# Patient Record
Sex: Male | Born: 1965 | Race: White | Hispanic: No | Marital: Married | Smoking: Former smoker
Health system: Southern US, Community
[De-identification: ages and names within clinical notes are randomized; demographics above are authoritative.]

## PROBLEM LIST (undated history)

## (undated) DIAGNOSIS — I10 Essential (primary) hypertension: Secondary | ICD-10-CM

## (undated) DIAGNOSIS — R600 Localized edema: Secondary | ICD-10-CM

## (undated) DIAGNOSIS — N19 Unspecified kidney failure: Secondary | ICD-10-CM

## (undated) DIAGNOSIS — Z87442 Personal history of urinary calculi: Secondary | ICD-10-CM

## (undated) HISTORY — PX: SHOULDER SURGERY: SHX246

## (undated) HISTORY — PX: FOOT SURGERY: SHX648

## (undated) HISTORY — PX: EYE SURGERY: SHX253

## (undated) HISTORY — PX: CATARACT EXTRACTION, BILATERAL: SHX1313

## (undated) HISTORY — DX: Unspecified kidney failure: N19

## (undated) HISTORY — PX: KIDNEY STONE SURGERY: SHX686

## (undated) HISTORY — DX: Essential (primary) hypertension: I10

## (undated) HISTORY — PX: CHOLECYSTECTOMY: SHX55

## (undated) HISTORY — PX: DIALYSIS/PERMA CATHETER INSERTION: CATH118288

## (undated) HISTORY — DX: Localized edema: R60.0

---

## 2005-05-06 HISTORY — PX: CYSTOSCOPY W/ URETERAL STENT PLACEMENT: SHX1429

## 2009-12-11 ENCOUNTER — Encounter: Admission: RE | Admit: 2009-12-11 | Discharge: 2009-12-11 | Payer: Self-pay | Admitting: Orthopaedic Surgery

## 2012-03-05 DIAGNOSIS — R600 Localized edema: Secondary | ICD-10-CM

## 2012-03-05 DIAGNOSIS — E785 Hyperlipidemia, unspecified: Secondary | ICD-10-CM | POA: Insufficient documentation

## 2012-03-05 DIAGNOSIS — G4730 Sleep apnea, unspecified: Secondary | ICD-10-CM

## 2012-03-05 DIAGNOSIS — I509 Heart failure, unspecified: Secondary | ICD-10-CM

## 2012-03-05 DIAGNOSIS — N200 Calculus of kidney: Secondary | ICD-10-CM

## 2012-03-05 HISTORY — DX: Sleep apnea, unspecified: G47.30

## 2012-03-05 HISTORY — DX: Morbid (severe) obesity due to excess calories: E66.01

## 2012-03-05 HISTORY — DX: Calculus of kidney: N20.0

## 2012-03-05 HISTORY — DX: Localized edema: R60.0

## 2012-03-05 HISTORY — DX: Heart failure, unspecified: I50.9

## 2012-03-05 HISTORY — DX: Hyperlipidemia, unspecified: E78.5

## 2015-07-26 DIAGNOSIS — Z89421 Acquired absence of other right toe(s): Secondary | ICD-10-CM | POA: Insufficient documentation

## 2015-07-26 HISTORY — DX: Acquired absence of other right toe(s): Z89.421

## 2016-03-13 DIAGNOSIS — M25871 Other specified joint disorders, right ankle and foot: Secondary | ICD-10-CM | POA: Insufficient documentation

## 2016-03-13 HISTORY — DX: Other specified joint disorders, right ankle and foot: M25.871

## 2016-05-08 DIAGNOSIS — M19071 Primary osteoarthritis, right ankle and foot: Secondary | ICD-10-CM

## 2016-05-08 HISTORY — DX: Primary osteoarthritis, right ankle and foot: M19.071

## 2016-08-01 DIAGNOSIS — I82409 Acute embolism and thrombosis of unspecified deep veins of unspecified lower extremity: Secondary | ICD-10-CM

## 2016-08-01 HISTORY — PX: IVC FILTER INSERTION: CATH118245

## 2016-08-01 HISTORY — DX: Acute embolism and thrombosis of unspecified deep veins of unspecified lower extremity: I82.409

## 2016-08-17 DIAGNOSIS — E1151 Type 2 diabetes mellitus with diabetic peripheral angiopathy without gangrene: Secondary | ICD-10-CM

## 2016-08-17 DIAGNOSIS — R600 Localized edema: Secondary | ICD-10-CM | POA: Diagnosis not present

## 2016-08-17 DIAGNOSIS — R0902 Hypoxemia: Secondary | ICD-10-CM

## 2016-08-17 DIAGNOSIS — I2699 Other pulmonary embolism without acute cor pulmonale: Secondary | ICD-10-CM

## 2016-08-18 DIAGNOSIS — R0902 Hypoxemia: Secondary | ICD-10-CM | POA: Diagnosis not present

## 2016-08-18 DIAGNOSIS — I2699 Other pulmonary embolism without acute cor pulmonale: Secondary | ICD-10-CM | POA: Diagnosis not present

## 2016-08-18 DIAGNOSIS — E1151 Type 2 diabetes mellitus with diabetic peripheral angiopathy without gangrene: Secondary | ICD-10-CM | POA: Diagnosis not present

## 2016-08-19 DIAGNOSIS — I2699 Other pulmonary embolism without acute cor pulmonale: Secondary | ICD-10-CM | POA: Diagnosis not present

## 2016-08-19 DIAGNOSIS — R600 Localized edema: Secondary | ICD-10-CM

## 2016-08-19 DIAGNOSIS — R0902 Hypoxemia: Secondary | ICD-10-CM | POA: Diagnosis not present

## 2016-08-19 DIAGNOSIS — E1151 Type 2 diabetes mellitus with diabetic peripheral angiopathy without gangrene: Secondary | ICD-10-CM | POA: Diagnosis not present

## 2016-08-20 DIAGNOSIS — E1151 Type 2 diabetes mellitus with diabetic peripheral angiopathy without gangrene: Secondary | ICD-10-CM | POA: Diagnosis not present

## 2016-08-20 DIAGNOSIS — R600 Localized edema: Secondary | ICD-10-CM | POA: Diagnosis not present

## 2016-08-20 DIAGNOSIS — R0902 Hypoxemia: Secondary | ICD-10-CM | POA: Diagnosis not present

## 2016-08-20 DIAGNOSIS — I2699 Other pulmonary embolism without acute cor pulmonale: Secondary | ICD-10-CM | POA: Diagnosis not present

## 2016-11-26 DIAGNOSIS — J4530 Mild persistent asthma, uncomplicated: Secondary | ICD-10-CM

## 2016-11-26 DIAGNOSIS — Z7901 Long term (current) use of anticoagulants: Secondary | ICD-10-CM | POA: Insufficient documentation

## 2016-11-26 DIAGNOSIS — Z79899 Other long term (current) drug therapy: Secondary | ICD-10-CM

## 2016-11-26 DIAGNOSIS — Z86711 Personal history of pulmonary embolism: Secondary | ICD-10-CM

## 2016-11-26 DIAGNOSIS — R5383 Other fatigue: Secondary | ICD-10-CM

## 2016-11-26 DIAGNOSIS — E782 Mixed hyperlipidemia: Secondary | ICD-10-CM

## 2016-11-26 DIAGNOSIS — R5381 Other malaise: Secondary | ICD-10-CM

## 2016-11-26 DIAGNOSIS — I10 Essential (primary) hypertension: Secondary | ICD-10-CM

## 2016-11-26 DIAGNOSIS — E66813 Obesity, class 3: Secondary | ICD-10-CM | POA: Insufficient documentation

## 2016-11-26 DIAGNOSIS — G4731 Primary central sleep apnea: Secondary | ICD-10-CM | POA: Insufficient documentation

## 2016-11-26 HISTORY — DX: Personal history of pulmonary embolism: Z86.711

## 2016-11-26 HISTORY — DX: Mild persistent asthma, uncomplicated: J45.30

## 2016-11-26 HISTORY — DX: Morbid (severe) obesity due to excess calories: E66.01

## 2016-11-26 HISTORY — DX: Long term (current) use of anticoagulants: Z79.01

## 2016-11-26 HISTORY — DX: Other long term (current) drug therapy: Z79.899

## 2016-11-26 HISTORY — DX: Essential (primary) hypertension: I10

## 2016-11-26 HISTORY — DX: Primary central sleep apnea: G47.31

## 2016-11-26 HISTORY — DX: Other fatigue: R53.83

## 2016-11-26 HISTORY — DX: Other malaise: R53.81

## 2016-11-26 HISTORY — DX: Obesity, class 3: E66.813

## 2016-11-26 HISTORY — DX: Mixed hyperlipidemia: E78.2

## 2017-01-15 DIAGNOSIS — M109 Gout, unspecified: Secondary | ICD-10-CM | POA: Insufficient documentation

## 2017-01-15 HISTORY — DX: Gout, unspecified: M10.9

## 2017-10-18 DIAGNOSIS — E1151 Type 2 diabetes mellitus with diabetic peripheral angiopathy without gangrene: Secondary | ICD-10-CM

## 2017-10-18 DIAGNOSIS — L0231 Cutaneous abscess of buttock: Secondary | ICD-10-CM

## 2017-10-18 DIAGNOSIS — E119 Type 2 diabetes mellitus without complications: Secondary | ICD-10-CM

## 2017-10-19 DIAGNOSIS — L0231 Cutaneous abscess of buttock: Secondary | ICD-10-CM | POA: Diagnosis not present

## 2017-10-19 DIAGNOSIS — E1151 Type 2 diabetes mellitus with diabetic peripheral angiopathy without gangrene: Secondary | ICD-10-CM | POA: Diagnosis not present

## 2017-10-19 DIAGNOSIS — E119 Type 2 diabetes mellitus without complications: Secondary | ICD-10-CM | POA: Diagnosis not present

## 2017-10-20 DIAGNOSIS — E1151 Type 2 diabetes mellitus with diabetic peripheral angiopathy without gangrene: Secondary | ICD-10-CM | POA: Diagnosis not present

## 2017-10-20 DIAGNOSIS — L0231 Cutaneous abscess of buttock: Secondary | ICD-10-CM | POA: Diagnosis not present

## 2017-10-20 DIAGNOSIS — E119 Type 2 diabetes mellitus without complications: Secondary | ICD-10-CM | POA: Diagnosis not present

## 2017-10-21 DIAGNOSIS — L0231 Cutaneous abscess of buttock: Secondary | ICD-10-CM | POA: Diagnosis not present

## 2017-10-21 DIAGNOSIS — E1151 Type 2 diabetes mellitus with diabetic peripheral angiopathy without gangrene: Secondary | ICD-10-CM | POA: Diagnosis not present

## 2017-10-21 DIAGNOSIS — E119 Type 2 diabetes mellitus without complications: Secondary | ICD-10-CM | POA: Diagnosis not present

## 2017-10-22 DIAGNOSIS — E119 Type 2 diabetes mellitus without complications: Secondary | ICD-10-CM | POA: Diagnosis not present

## 2017-10-22 DIAGNOSIS — L0231 Cutaneous abscess of buttock: Secondary | ICD-10-CM | POA: Diagnosis not present

## 2017-10-22 DIAGNOSIS — E1151 Type 2 diabetes mellitus with diabetic peripheral angiopathy without gangrene: Secondary | ICD-10-CM | POA: Diagnosis not present

## 2017-10-23 DIAGNOSIS — E1151 Type 2 diabetes mellitus with diabetic peripheral angiopathy without gangrene: Secondary | ICD-10-CM | POA: Diagnosis not present

## 2017-10-23 DIAGNOSIS — E119 Type 2 diabetes mellitus without complications: Secondary | ICD-10-CM | POA: Diagnosis not present

## 2017-10-23 DIAGNOSIS — L0231 Cutaneous abscess of buttock: Secondary | ICD-10-CM | POA: Diagnosis not present

## 2017-10-24 DIAGNOSIS — L0231 Cutaneous abscess of buttock: Secondary | ICD-10-CM | POA: Diagnosis not present

## 2017-10-24 DIAGNOSIS — E119 Type 2 diabetes mellitus without complications: Secondary | ICD-10-CM | POA: Diagnosis not present

## 2017-10-24 DIAGNOSIS — E1151 Type 2 diabetes mellitus with diabetic peripheral angiopathy without gangrene: Secondary | ICD-10-CM | POA: Diagnosis not present

## 2018-04-27 DIAGNOSIS — E119 Type 2 diabetes mellitus without complications: Secondary | ICD-10-CM

## 2018-04-27 DIAGNOSIS — I2699 Other pulmonary embolism without acute cor pulmonale: Secondary | ICD-10-CM

## 2018-04-27 DIAGNOSIS — N183 Chronic kidney disease, stage 3 (moderate): Secondary | ICD-10-CM

## 2018-04-27 DIAGNOSIS — G4730 Sleep apnea, unspecified: Secondary | ICD-10-CM

## 2018-04-27 DIAGNOSIS — A419 Sepsis, unspecified organism: Secondary | ICD-10-CM

## 2018-04-28 DIAGNOSIS — A419 Sepsis, unspecified organism: Secondary | ICD-10-CM | POA: Diagnosis not present

## 2018-04-28 DIAGNOSIS — N183 Chronic kidney disease, stage 3 (moderate): Secondary | ICD-10-CM | POA: Diagnosis not present

## 2018-04-28 DIAGNOSIS — G4730 Sleep apnea, unspecified: Secondary | ICD-10-CM | POA: Diagnosis not present

## 2018-04-28 DIAGNOSIS — Z0001 Encounter for general adult medical examination with abnormal findings: Secondary | ICD-10-CM

## 2018-04-29 DIAGNOSIS — A419 Sepsis, unspecified organism: Secondary | ICD-10-CM | POA: Diagnosis not present

## 2018-04-29 DIAGNOSIS — G4730 Sleep apnea, unspecified: Secondary | ICD-10-CM | POA: Diagnosis not present

## 2018-04-29 DIAGNOSIS — N183 Chronic kidney disease, stage 3 (moderate): Secondary | ICD-10-CM | POA: Diagnosis not present

## 2018-04-30 DIAGNOSIS — A419 Sepsis, unspecified organism: Secondary | ICD-10-CM | POA: Diagnosis not present

## 2018-04-30 DIAGNOSIS — N183 Chronic kidney disease, stage 3 (moderate): Secondary | ICD-10-CM | POA: Diagnosis not present

## 2018-04-30 DIAGNOSIS — G4730 Sleep apnea, unspecified: Secondary | ICD-10-CM | POA: Diagnosis not present

## 2018-05-01 DIAGNOSIS — N183 Chronic kidney disease, stage 3 (moderate): Secondary | ICD-10-CM | POA: Diagnosis not present

## 2018-05-01 DIAGNOSIS — G4730 Sleep apnea, unspecified: Secondary | ICD-10-CM | POA: Diagnosis not present

## 2018-05-01 DIAGNOSIS — A419 Sepsis, unspecified organism: Secondary | ICD-10-CM | POA: Diagnosis not present

## 2018-05-20 DIAGNOSIS — L603 Nail dystrophy: Secondary | ICD-10-CM | POA: Insufficient documentation

## 2018-05-20 HISTORY — DX: Nail dystrophy: L60.3

## 2018-05-26 DIAGNOSIS — M48061 Spinal stenosis, lumbar region without neurogenic claudication: Secondary | ICD-10-CM

## 2018-05-26 DIAGNOSIS — D638 Anemia in other chronic diseases classified elsewhere: Secondary | ICD-10-CM | POA: Insufficient documentation

## 2018-05-26 DIAGNOSIS — K7581 Nonalcoholic steatohepatitis (NASH): Secondary | ICD-10-CM

## 2018-05-26 DIAGNOSIS — I517 Cardiomegaly: Secondary | ICD-10-CM

## 2018-05-26 HISTORY — DX: Spinal stenosis, lumbar region without neurogenic claudication: M48.061

## 2018-05-26 HISTORY — DX: Cardiomegaly: I51.7

## 2018-05-26 HISTORY — DX: Anemia in other chronic diseases classified elsewhere: D63.8

## 2018-05-26 HISTORY — DX: Nonalcoholic steatohepatitis (NASH): K75.81

## 2018-06-03 DIAGNOSIS — D472 Monoclonal gammopathy: Secondary | ICD-10-CM

## 2018-06-03 HISTORY — DX: Monoclonal gammopathy: D47.2

## 2019-06-18 DIAGNOSIS — L84 Corns and callosities: Secondary | ICD-10-CM | POA: Insufficient documentation

## 2019-06-18 HISTORY — DX: Corns and callosities: L84

## 2019-07-12 DIAGNOSIS — E1122 Type 2 diabetes mellitus with diabetic chronic kidney disease: Secondary | ICD-10-CM

## 2019-07-12 DIAGNOSIS — N183 Chronic kidney disease, stage 3 unspecified: Secondary | ICD-10-CM | POA: Insufficient documentation

## 2019-07-12 DIAGNOSIS — Z794 Long term (current) use of insulin: Secondary | ICD-10-CM | POA: Insufficient documentation

## 2019-07-12 DIAGNOSIS — N1832 Chronic kidney disease, stage 3b: Secondary | ICD-10-CM

## 2019-07-12 DIAGNOSIS — I129 Hypertensive chronic kidney disease with stage 1 through stage 4 chronic kidney disease, or unspecified chronic kidney disease: Secondary | ICD-10-CM | POA: Insufficient documentation

## 2019-07-12 HISTORY — DX: Hypertensive chronic kidney disease with stage 1 through stage 4 chronic kidney disease, or unspecified chronic kidney disease: I12.9

## 2019-07-12 HISTORY — DX: Chronic kidney disease, stage 3b: N18.32

## 2019-07-12 HISTORY — DX: Chronic kidney disease, stage 3 unspecified: N18.30

## 2019-07-12 HISTORY — DX: Type 2 diabetes mellitus with diabetic chronic kidney disease: E11.22

## 2019-07-12 HISTORY — DX: Long term (current) use of insulin: Z79.4

## 2020-01-10 DIAGNOSIS — E11621 Type 2 diabetes mellitus with foot ulcer: Secondary | ICD-10-CM | POA: Insufficient documentation

## 2020-01-10 DIAGNOSIS — L97512 Non-pressure chronic ulcer of other part of right foot with fat layer exposed: Secondary | ICD-10-CM | POA: Insufficient documentation

## 2020-01-10 HISTORY — DX: Non-pressure chronic ulcer of other part of right foot with fat layer exposed: L97.512

## 2020-01-10 HISTORY — DX: Type 2 diabetes mellitus with foot ulcer: E11.621

## 2020-02-09 DIAGNOSIS — U071 COVID-19: Secondary | ICD-10-CM

## 2020-02-09 HISTORY — DX: COVID-19: U07.1

## 2020-03-17 LAB — HEPATIC FUNCTION PANEL
ALT: 20 (ref 10–40)
AST: 22 (ref 14–40)
Alkaline Phosphatase: 65 (ref 25–125)
Bilirubin, Total: 0.6

## 2020-03-17 LAB — IRON,TIBC AND FERRITIN PANEL
%SAT: 46
Ferritin: 285
Iron: 116
TIBC: 253

## 2020-03-17 LAB — CBC AND DIFFERENTIAL
HCT: 41 (ref 41–53)
Hemoglobin: 14.5 (ref 13.5–17.5)
Neutrophils Absolute: 5.46
Platelets: 244 (ref 150–399)
WBC: 8.4

## 2020-03-17 LAB — COMPREHENSIVE METABOLIC PANEL
Albumin: 3.8 (ref 3.5–5.0)
Calcium: 9.3 (ref 8.7–10.7)

## 2020-03-17 LAB — BASIC METABOLIC PANEL
BUN: 54 — AB (ref 4–21)
CO2: 28 — AB (ref 13–22)
Chloride: 100 (ref 99–108)
Creatinine: 1.8 — AB (ref 0.6–1.3)
Glucose: 302
Potassium: 4.1 (ref 3.4–5.3)
Sodium: 104 — AB (ref 137–147)

## 2020-03-17 LAB — CBC: RBC: 4.34 (ref 3.87–5.11)

## 2020-03-21 ENCOUNTER — Other Ambulatory Visit: Payer: Self-pay | Admitting: Oncology

## 2020-03-21 HISTORY — DX: Other hemochromatosis: E83.118

## 2020-03-23 ENCOUNTER — Other Ambulatory Visit: Payer: Self-pay | Admitting: Hematology and Oncology

## 2020-03-24 ENCOUNTER — Other Ambulatory Visit: Payer: Self-pay

## 2020-03-24 ENCOUNTER — Inpatient Hospital Stay: Payer: PRIVATE HEALTH INSURANCE | Attending: Oncology

## 2020-03-29 ENCOUNTER — Other Ambulatory Visit: Payer: Self-pay | Admitting: Oncology

## 2020-05-05 DIAGNOSIS — Z8616 Personal history of COVID-19: Secondary | ICD-10-CM

## 2020-05-05 HISTORY — DX: Personal history of COVID-19: Z86.16

## 2020-07-21 ENCOUNTER — Inpatient Hospital Stay: Payer: PRIVATE HEALTH INSURANCE | Attending: Oncology

## 2020-07-21 ENCOUNTER — Other Ambulatory Visit: Payer: Self-pay | Admitting: Hematology and Oncology

## 2020-07-23 ENCOUNTER — Other Ambulatory Visit: Payer: Self-pay | Admitting: Oncology

## 2020-07-23 NOTE — Progress Notes (Deleted)
[place]  [address] [place],  [place]  [address] [phone]  Clinic Day:  07/23/2020  Referring physician: Raina Mina., MD   HISTORY OF PRESENT ILLNESS:  The patient is a 55 y.o. 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  Due to his elevated iron parameters, the patient undergoes periodic phlebotomies to keep his ferritin less than 100.  He comes in today to reassess his iron parameters.  Since his last visit, the patient has been doing well.  He denies having any systemic symptoms or complications related to his underlying hemochromatosis.    PHYSICAL EXAM:  There were no vitals taken for this visit. Wt Readings from Last 3 Encounters:  No data found for Wt   There is no height or weight on file to calculate BMI. Performance status (ECOG): {CHL ONC [id number] Physical Exam Constitutional:      Appearance: Normal appearance. He is not ill-appearing.  HENT:     Mouth/Throat:     Mouth: Mucous membranes are moist.     Pharynx: Oropharynx is clear. No oropharyngeal exudate or posterior oropharyngeal erythema.  Cardiovascular:     Rate and Rhythm: Normal rate and regular rhythm.     Heart sounds: No murmur heard. No friction rub. No gallop.   Pulmonary:     Effort: Pulmonary effort is normal. No respiratory distress.     Breath sounds: Normal breath sounds. No wheezing, rhonchi or rales.  Chest:  Breasts:     Right: No axillary adenopathy or supraclavicular adenopathy.     Left: No axillary adenopathy or supraclavicular adenopathy.    Abdominal:     General: Bowel sounds are normal. There is no distension.     Palpations: Abdomen is soft. There is no mass.     Tenderness: There is no abdominal tenderness.  Musculoskeletal:        General: No swelling.     Right lower leg: No edema.     Left lower leg: No edema.  Lymphadenopathy:     Cervical: No cervical adenopathy.     Upper Body:     Right upper body: No  supraclavicular or axillary adenopathy.     Left upper body: No supraclavicular or axillary adenopathy.     Lower Body: No right inguinal adenopathy. No left inguinal adenopathy.  Skin:    General: Skin is warm.     Coloration: Skin is not jaundiced.     Findings: No lesion or rash.  Neurological:     General: No focal deficit present.     Mental Status: He is alert and oriented to person, place, and time. Mental status is at baseline.     Cranial Nerves: Cranial nerves are intact.  Psychiatric:        Mood and Affect: Mood normal.        Behavior: Behavior normal.        Thought Content: Thought content normal.     LABS:   CBC Latest Ref Rng & Units 03/17/2020  WBC - 8.4  Hemoglobin 13.5 - 17.5 14.5  Hematocrit 41 - 53 41  Platelets 150 - 399 244   CMP Latest Ref Rng & Units 03/17/2020  BUN 4 - 21 54(A)  Creatinine 0.6 - 1.3 1.8(A)  Sodium 137 - 147 104(A)  Potassium 3.4 - 5.3 4.1  Chloride 99 - 108 100  CO2 13 - 22 28(A)  Calcium 8.7 - 10.7 9.3  Alkaline Phos 25 - 125 65  AST 14 -  40 22  ALT 10 - 40 20     No results found for: CEA1 / No results found for: CEA1 No results found for: PSA1 No results found for: UUV253 No results found for: CAN125  No results found for: TOTALPROTELP, ALBUMINELP, A1GS, A2GS, BETS, BETA2SER, GAMS, MSPIKE, SPEI Lab Results  Component Value Date   TIBC 253 03/17/2020   FERRITIN 285 03/17/2020   IRONPCTSAT 46 03/17/2020   No results found for: LDH  No results found for: AFPTUMOR, TOTALPROTELP, ALBUMINELP, A1GS, A2GS, BETS, BETA2SER, GAMS, MSPIKE, SPEI, LDH, CEA1, PSA1, IGASERUM, IGGSERUM, IGMSERUM, THGAB, THYROGLB  Recent Review Flowsheet Data    Oncology Labs 03/17/2020   FERRITIN 285   IRONPCTSAT 46       STUDIES:  No results found.    ASSESSMENT & PLAN:   Assessment/Plan:  A 55 y.o. male with *** .The patient understands all the plans discussed today and is in agreement with them.      Dequincy Macarthur Critchley, MD

## 2020-07-24 ENCOUNTER — Inpatient Hospital Stay: Payer: PRIVATE HEALTH INSURANCE

## 2020-07-24 ENCOUNTER — Telehealth: Payer: Self-pay

## 2020-07-24 ENCOUNTER — Inpatient Hospital Stay: Payer: PRIVATE HEALTH INSURANCE | Admitting: Oncology

## 2020-07-24 NOTE — Telephone Encounter (Signed)
Left patient a message to call scheduling and get a new appt since he missed his lab work and phlebotomy today.

## 2020-07-28 ENCOUNTER — Inpatient Hospital Stay: Payer: PRIVATE HEALTH INSURANCE

## 2020-07-28 ENCOUNTER — Other Ambulatory Visit: Payer: Self-pay

## 2020-07-31 NOTE — Progress Notes (Signed)
[facility]  [address] [place],  [place]  [address] [phone]  Clinic Day:  08/01/2020  Referring physician: Raina Mina., MD   HISTORY OF PRESENT ILLNESS:  The patient is a 55 y.o. 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  Due to his elevated iron parameters, the patient undergoes periodic phlebotomies to keep his ferritin less than 100.  He comes in today to reassess his iron parameters.  Since his last visit, the patient has been doing well.  He denies having any systemic symptoms or complications related to his underlying hemochromatosis.  PHYSICAL EXAM:  Blood pressure (!) 190/93, pulse 74, temperature 98.2 F (36.8 C), resp. rate 18, height 6' (1.829 m), weight (!) 388 lb 9.6 oz (176.3 kg), SpO2 97 %. Wt Readings from Last 3 Encounters:  08/01/20 (!) 388 lb 9.6 oz (176.3 kg)   Body mass index is 52.7 kg/m. Performance status (ECOG): 1 - Symptomatic but completely ambulatory Physical Exam Constitutional:      Appearance: Normal appearance. He is not ill-appearing.  HENT:     Mouth/Throat:     Mouth: Mucous membranes are moist.     Pharynx: Oropharynx is clear. No oropharyngeal exudate or posterior oropharyngeal erythema.  Cardiovascular:     Rate and Rhythm: Normal rate and regular rhythm.     Heart sounds: No murmur heard. No friction rub. No gallop.   Pulmonary:     Effort: Pulmonary effort is normal. No respiratory distress.     Breath sounds: Normal breath sounds. No wheezing, rhonchi or rales.  Chest:  Breasts:     Right: No axillary adenopathy or supraclavicular adenopathy.     Left: No axillary adenopathy or supraclavicular adenopathy.    Abdominal:     General: Bowel sounds are normal. There is no distension.     Palpations: Abdomen is soft. There is no mass.     Tenderness: There is no abdominal tenderness.  Musculoskeletal:        General: No swelling.     Right lower leg: No edema.     Left lower leg: No edema.   Lymphadenopathy:     Cervical: No cervical adenopathy.     Upper Body:     Right upper body: No supraclavicular or axillary adenopathy.     Left upper body: No supraclavicular or axillary adenopathy.     Lower Body: No right inguinal adenopathy. No left inguinal adenopathy.  Skin:    General: Skin is warm.     Coloration: Skin is not jaundiced.     Findings: No lesion or rash.  Neurological:     General: No focal deficit present.     Mental Status: He is alert and oriented to person, place, and time. Mental status is at baseline.     Cranial Nerves: Cranial nerves are intact.  Psychiatric:        Mood and Affect: Mood normal.        Behavior: Behavior normal.        Thought Content: Thought content normal.     LABS:     ASSESSMENT & PLAN:  Assessment/Plan:  A 55 y.o. 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  Although his ferritin is much greater than 100, I will not phlebotomize him today due to his borderline low hemoglobin.  His other iron parameters are fine.  Furthermore, he does not have the most aggressive form of hemochromatosis to where I am concerned about organ damage developing over time from excessive iron deposition.  I will see him back in 4 months to reassess his iron parameters. The patient understands all the plans discussed today and is in agreement with them.    Noha Karasik Macarthur Critchley, MD

## 2020-08-01 ENCOUNTER — Inpatient Hospital Stay: Payer: PRIVATE HEALTH INSURANCE

## 2020-08-01 ENCOUNTER — Inpatient Hospital Stay: Payer: PRIVATE HEALTH INSURANCE | Attending: Oncology | Admitting: Oncology

## 2020-08-01 ENCOUNTER — Telehealth: Payer: Self-pay | Admitting: Oncology

## 2020-08-01 ENCOUNTER — Other Ambulatory Visit: Payer: Self-pay | Admitting: Oncology

## 2020-08-01 ENCOUNTER — Other Ambulatory Visit: Payer: Self-pay

## 2020-08-01 DIAGNOSIS — M76821 Posterior tibial tendinitis, right leg: Secondary | ICD-10-CM

## 2020-08-01 DIAGNOSIS — M2060 Acquired deformities of toe(s), unspecified, unspecified foot: Secondary | ICD-10-CM

## 2020-08-01 DIAGNOSIS — B353 Tinea pedis: Secondary | ICD-10-CM | POA: Insufficient documentation

## 2020-08-01 DIAGNOSIS — K219 Gastro-esophageal reflux disease without esophagitis: Secondary | ICD-10-CM | POA: Insufficient documentation

## 2020-08-01 HISTORY — DX: Acquired deformities of toe(s), unspecified, unspecified foot: M20.60

## 2020-08-01 HISTORY — DX: Tinea pedis: B35.3

## 2020-08-01 HISTORY — DX: Posterior tibial tendinitis, right leg: M76.821

## 2020-08-01 HISTORY — DX: Gastro-esophageal reflux disease without esophagitis: K21.9

## 2020-08-01 NOTE — Telephone Encounter (Signed)
Per 3/1 LOS, patient scheduled for 6/30 Labs, 7/1 Follow Up.  Gave patient Appt Summary

## 2020-08-04 ENCOUNTER — Encounter: Payer: Self-pay | Admitting: Oncology

## 2020-08-16 DIAGNOSIS — R079 Chest pain, unspecified: Secondary | ICD-10-CM

## 2020-08-16 HISTORY — DX: Chest pain, unspecified: R07.9

## 2020-08-21 ENCOUNTER — Encounter: Payer: Self-pay | Admitting: *Deleted

## 2020-08-21 ENCOUNTER — Encounter: Payer: Self-pay | Admitting: Cardiology

## 2020-09-13 ENCOUNTER — Ambulatory Visit (INDEPENDENT_AMBULATORY_CARE_PROVIDER_SITE_OTHER): Payer: PRIVATE HEALTH INSURANCE | Admitting: Cardiology

## 2020-09-13 ENCOUNTER — Other Ambulatory Visit: Payer: Self-pay

## 2020-09-13 VITALS — BP 134/80 | HR 78 | Ht 77.0 in | Wt 383.0 lb

## 2020-09-13 DIAGNOSIS — N1832 Chronic kidney disease, stage 3b: Secondary | ICD-10-CM

## 2020-09-13 DIAGNOSIS — E1122 Type 2 diabetes mellitus with diabetic chronic kidney disease: Secondary | ICD-10-CM

## 2020-09-13 DIAGNOSIS — E785 Hyperlipidemia, unspecified: Secondary | ICD-10-CM

## 2020-09-13 DIAGNOSIS — I10 Essential (primary) hypertension: Secondary | ICD-10-CM

## 2020-09-13 DIAGNOSIS — Z794 Long term (current) use of insulin: Secondary | ICD-10-CM

## 2020-09-13 DIAGNOSIS — R0789 Other chest pain: Secondary | ICD-10-CM

## 2020-09-13 HISTORY — DX: Other chest pain: R07.89

## 2020-09-13 MED ORDER — ISOSORBIDE MONONITRATE ER 30 MG PO TB24: 30.0000 mg | ORAL_TABLET | Freq: Every day | ORAL | 1 refills | Status: DC

## 2020-09-13 MED ORDER — NITROGLYCERIN 0.4 MG SL SUBL: 0.4000 mg | SUBLINGUAL_TABLET | SUBLINGUAL | 11 refills | Status: DC | PRN

## 2020-09-13 NOTE — Patient Instructions (Addendum)
Medication Instructions:  Your physician has recommended you make the following change in your medication:  START: imdur 30 mg daily   TAKE AS NEEDED FOR CHEST PAIN: Nitroglycerin 0.4 mg sublingual (under your tongue) as needed for chest pain. If experiencing chest pain, stop what you are doing and sit down. Take 1 nitroglycerin and wait 5 minutes. If chest pain continues, take another nitroglycerin and wait 5 minutes. If chest pain does not subside, take 1 more nitroglycerin and dial 911. You make take a total of 3 nitroglycerin in a 15 minute time frame.  *If you need a refill on your cardiac medications before your next appointment, please call your pharmacy*   Lab Work: none If you have labs (blood work) drawn today and your tests are completely normal, you will receive your results only by: Marland Kitchen MyChart Message (if you have MyChart) OR . A paper copy in the mail If you have any lab test that is abnormal or we need to change your treatment, we will call you to review the results.   Testing/Procedures:   [facility] [address] [place], [place] [address] Phone:  [phone]    Please arrive 15 minutes prior to your appointment time for registration and insurance purposes.  The test will take approximately 3 to 4 hours to complete; you may bring reading material.  If someone comes with you to your appointment, they will need to remain in the main lobby due to limited space in the testing area. **If you are pregnant or breastfeeding, please notify the nuclear lab prior to your appointment**  How to prepare for your Myocardial Perfusion Test: . Do not eat or drink 3 hours prior to your test, except you may have water. . Do not consume products containing caffeine (regular or decaffeinated) 12 hours prior to your test. (ex: coffee, chocolate, sodas, tea). . Do bring a list of your current medications with you.  If not listed below, you may take your  medications as normal.  . Do wear comfortable clothes (no dresses or overalls) and walking shoes, tennis shoes preferred (No heels or open toe shoes are allowed). . Do NOT wear cologne, perfume, aftershave, or lotions (deodorant is allowed). . If these instructions are not followed, your test will have to be rescheduled.  Please report to [address] for your test.  If you have questions or concerns about your appointment, you can call the [facility] at [phone].  If you cannot keep your appointment, please provide 24 hours notification to the Nuclear Lab, to avoid a possible $50 charge to your account.    Follow-Up: At [facility], you and your health needs are our priority.  As part of our continuing mission to provide you with exceptional heart care, we have created designated Provider Care Teams.  These Care Teams include your primary Cardiologist (physician) and Advanced Practice Providers (APPs -  Physician Assistants and Nurse Practitioners) who all work together to provide you with the care you need, when you need it.  We recommend signing up for the patient portal called "MyChart".  Sign up information is provided on this After Visit Summary.  MyChart is used to connect with patients for Virtual Visits (Telemedicine).  Patients are able to view lab/test results, encounter notes, upcoming appointments, etc.  Non-urgent messages can be sent to your provider as well.   To learn more about what you can do with MyChart, go to  [email or web address].    Your next appointment:   6 week(s)  The format for your next appointment:   In Person  Provider:   Jenne Campus, MD   Other Instructions  Nitroglycerin sublingual tablets What is this medicine? NITROGLYCERIN (nye troe GLI ser in) is a type of vasodilator. It relaxes blood vessels, increasing the blood and oxygen supply to your heart. This medicine is used to relieve chest pain  caused by angina. It is also used to prevent chest pain before activities like climbing stairs, going outdoors in cold weather, or sexual activity. This medicine may be used for other purposes; ask your health care provider or pharmacist if you have questions. COMMON BRAND NAME(S): Nitroquick, Nitrostat, Nitrotab What should I tell my health care provider before I take this medicine? They need to know if you have any of these conditions:  anemia  head injury, recent stroke, or bleeding in the brain  liver disease  previous heart attack  an unusual or allergic reaction to nitroglycerin, other medicines, foods, dyes, or preservatives  pregnant or trying to get pregnant  breast-feeding How should I use this medicine? Take this medicine by mouth as needed. Use at the first sign of an angina attack (chest pain or tightness). You can also take this medicine 5 to 10 minutes before an event likely to produce chest pain. Follow the directions exactly as written on the prescription label. Place one tablet under your tongue and let it dissolve. Do not swallow whole. Replace the dose if you accidentally swallow it. It will help if your mouth is not dry. Saliva around the tablet will help it to dissolve more quickly. Do not eat or drink, smoke or chew tobacco while a tablet is dissolving. Sit down when taking this medicine. In an angina attack, you should feel better within 5 minutes after your first dose. You can take a dose every 5 minutes up to a total of 3 doses. If you do not feel better or feel worse after 1 dose, call 9-1-1 at once. Do not take more than 3 doses in 15 minutes. Your health care provider might give you other directions. Follow those directions if he or she does. Do not take your medicine more often than directed. Talk to your health care provider about the use of this medicine in children. Special care may be needed. Overdosage: If you think you have taken too much of this medicine  contact a poison control center or emergency room at once. NOTE: This medicine is only for you. Do not share this medicine with others. What if I miss a dose? This does not apply. This medicine is only used as needed. What may interact with this medicine? Do not take this medicine with any of the following medications:  certain migraine medicines like ergotamine and dihydroergotamine (DHE)  medicines used to treat erectile dysfunction like sildenafil, tadalafil, and vardenafil  riociguat This medicine may also interact with the following medications:  alteplase  aspirin  heparin  medicines for high blood pressure  medicines for mental depression  other medicines used to treat angina  phenothiazines like chlorpromazine, mesoridazine, prochlorperazine, thioridazine This list may not describe all possible interactions. Give your health care provider a list of all the medicines, herbs, non-prescription drugs, or dietary supplements you use. Also tell them if you smoke, drink alcohol, or use illegal drugs. Some items may interact with your medicine. What should I watch for while using this medicine? Tell your doctor or health  care professional if you feel your medicine is no longer working. Keep this medicine with you at all times. Sit or lie down when you take your medicine to prevent falling if you feel dizzy or faint after using it. Try to remain calm. This will help you to feel better faster. If you feel dizzy, take several deep breaths and lie down with your feet propped up, or bend forward with your head resting between your knees. You may get drowsy or dizzy. Do not drive, use machinery, or do anything that needs mental alertness until you know how this drug affects you. Do not stand or sit up quickly, especially if you are an older patient. This reduces the risk of dizzy or fainting spells. Alcohol can make you more drowsy and dizzy. Avoid alcoholic drinks. Do not treat yourself  for coughs, colds, or pain while you are taking this medicine without asking your doctor or health care professional for advice. Some ingredients may increase your blood pressure. What side effects may I notice from receiving this medicine? Side effects that you should report to your doctor or health care professional as soon as possible:  allergic reactions (skin rash, itching or hives; swelling of the face, lips, or tongue)  low blood pressure (dizziness; feeling faint or lightheaded, falls; unusually weak or tired)  low red blood cell counts (trouble breathing; feeling faint; lightheaded, falls; unusually weak or tired) Side effects that usually do not require medical attention (report to your doctor or health care professional if they continue or are bothersome):  facial flushing (redness)  headache  nausea, vomiting This list may not describe all possible side effects. Call your doctor for medical advice about side effects. You may report side effects to FDA at 1-800-FDA-1088. Where should I keep my medicine? Keep out of the reach of children. Store at room temperature between 20 and 25 degrees C (68 and 77 degrees F). Store in [employer]. Protect from light and moisture. Keep tightly closed. Throw away any unused medicine after the expiration date. NOTE: This sheet is a summary. It may not cover all possible information. If you have questions about this medicine, talk to your doctor, pharmacist, or health care provider.  2021 [employer]/Gold Standard (2018-02-18 16:46:32)

## 2020-09-13 NOTE — Progress Notes (Signed)
ekg 

## 2020-09-13 NOTE — Progress Notes (Signed)
Cardiology Consultation:    Date:  09/13/2020   ID:  Sean Valentine, DOB 17-Mar-1966, MRN [id number]  PCP:  Raina Mina., MD  Cardiologist:  Jenne Campus, MD   Referring MD: Gweneth Fritter, FNP   Chief Complaint  Patient presents with  . Chest Pain    History of Present Illness:    Sean Valentine is a 55 y.o. male who is being seen today for the evaluation of chest pain at the request of Goins, Gillis Santa, FNP.  Past medical history significant for diabetes mellitus, essential hypertension, obstructive sleep apnea, hemochromatosis, morbid obesity.  He was referred to [place] because of chest pain.  For about a month he has been experiencing chest pain it happened about twice a week pain last for few minutes is a heavy sensation going towards the left arm there is no shortness of breath there is no sweating associated with this sensation.  He grades his sensation as 6 in scale up to 10.  He does not have exertional chest pain.  He admits that his job required most of the time sitting he works in [employer] and have to walk some but he does maybe about half a mile a day and have no difficulty doing it.  He denies having any exertional chest pain tightness squeezing pressure burning chest. Apparently years ago he was told to have congestive heart failure he has been put on diuretic and staying on it for many years. He quit smoking many years ago smoked a cigar when he was much younger. No family history of premature coronary artery disease  Past Medical History:  Diagnosis Date  . Anemia, chronic disease 05/26/2018   Formatting of this note might be different from the original. Eval with [facility] and phlebotomy for Hemochromotosis.  . Anticoagulant long-term use 11/26/2016  . Arthritis of right foot 05/08/2016  . Benign hypertension with CKD (chronic kidney disease) stage III ([place]) 07/12/2019  . Bilateral leg edema   . Chest pain at rest 08/16/2020  . CHF (congestive heart failure) ([place]) 03/05/2012  .  Deformity of toe 08/01/2020  . Diabetic ulcer of toe of right foot associated with type 2 diabetes mellitus, with fat layer exposed ([place]) 01/10/2020   Formatting of this note might be different from the original. Distal tuft right fifth toe  . Dyslipidemia 03/05/2012  . Essential hypertension 11/26/2016  . GERD (gastroesophageal reflux disease) 08/01/2020  . Gouty arthritis of left hand 01/15/2017  . High risk medication use 11/26/2016  . History of COVID-19 05/05/2020   Formatting of this note might be different from the original. 02/2020  . History of pulmonary embolism 11/26/2016   Formatting of this note might be different from the original. Felt severe with permanent anticoagulation and Filter placed because of burden.  . Hypertension   . Localized edema 03/05/2012  . LVH (left ventricular hypertrophy) 05/26/2018   Formatting of this note might be different from the original. 04/2018 EF 555-60%  . Malaise and fatigue 11/26/2016  . MGUS (monoclonal gammopathy of unknown significance) 06/03/2018   Formatting of this note might be different from the original. Possible. Seen on SPEP 05/2018  . Mild persistent asthma 11/26/2016  . Mixed hyperlipidemia 11/26/2016  . Morbid (severe) obesity due to excess calories ([place]) 03/05/2012  . Morbid obesity with body mass index (BMI) of 40.0 to 49.9 ([place]) 11/26/2016  . Nail dystrophy 05/20/2018  . Nephrolithiasis 03/05/2012  . Nonalcoholic steatohepatitis ([facility]) 05/26/2018  . Other hemochromatosis 03/21/2020  .  Posterior tibial tendinitis of right lower extremity 08/01/2020  . Pre-ulcerative calluses 06/18/2019  . Primary central sleep apnea 11/26/2016   Formatting of this note might be different from the original. bipap  . Sleep apnea 03/05/2012   Formatting of this note might be different from the original. [place].  Marland Kitchen Spinal stenosis of lumbar region 05/26/2018  . Status post amputation of toe of right foot ([place]) 07/26/2015  . Subfibular impingement, right 03/13/2016   . Tinea pedis 08/01/2020  . Type 2 diabetes mellitus with stage 3b chronic kidney disease, with long-term current use of insulin ([place]) 07/12/2019   Formatting of this note might be different from the original. IMO 10/01 Updates  Formatting of this note might be different from the original. 2000    Past Surgical History:  Procedure Laterality Date  . CHOLECYSTECTOMY    . CYSTOSCOPY W/ URETERAL STENT PLACEMENT Left 05/06/2005  . FOOT SURGERY Right    Big toe amputation  . IVC FILTER INSERTION  08/2016  . KIDNEY STONE SURGERY    . SHOULDER SURGERY Left     Current Medications: Current Meds  Medication Sig  . amLODipine (NORVASC) 5 MG tablet Take 5 mg by mouth daily.  . carvedilol (COREG) 25 MG tablet Take 25 mg by mouth 2 (two) times daily with a meal.  . ciprofloxacin (CIPRO) 500 MG tablet Take 500 mg by mouth 2 (two) times daily.  . empagliflozin (JARDIANCE) 25 MG TABS tablet Take 25 mg by mouth daily.  . ergocalciferol (VITAMIN D2) 1.25 MG (50000 UT) capsule Take 1 capsule by mouth once a week.  . insulin aspart (FIASP FLEXTOUCH) 100 UNIT/ML FlexTouch Pen Inject 20 Units into the skin 3 (three) times daily. Inject 20 units into the skin before each meal  . insulin regular human CONCENTRATED (HUMULIN R) 500 UNIT/ML injection Inject 30 Units into the skin 3 (three) times daily with meals.  Marland Kitchen lisinopril-hydrochlorothiazide (ZESTORETIC) 20-12.5 MG tablet Take 1 tablet by mouth daily.  . rivaroxaban (XARELTO) 20 MG TABS tablet Take 20 mg by mouth daily with supper.  . torsemide (DEMADEX) 20 MG tablet Take 20 mg by mouth daily as needed (for swelling).     Allergies:   Patient has no known allergies.   Social History   Socioeconomic History  . Marital status: Married    Spouse name: Not on file  . Number of children: Not on file  . Years of education: Not on file  . Highest education level: Not on file  Occupational History  . Not on file  Tobacco Use  . Smoking status: Former  Smoker    Years: 1.00    Quit date: 1986    Years since quitting: 36.3  . Smokeless tobacco: Never Used  Substance and Sexual Activity  . Alcohol use: Not on file  . Drug use: Not on file  . Sexual activity: Not on file  Other Topics Concern  . Not on file  Social History Narrative  . Not on file   Social Determinants of Health   Financial Resource Strain: Not on file  Food Insecurity: Not on file  Transportation Needs: Not on file  Physical Activity: Not on file  Stress: Not on file  Social Connections: Not on file     Family History: The patient's family history includes Alzheimer's disease in his mother; Cancer in his mother; Clotting disorder in his mother; Diabetes in his father and mother; Stroke in his mother. ROS:   Please see the  history of present illness.    All 14 point review of systems negative except as described per history of present illness.  EKGs/Labs/Other Studies Reviewed:    The following studies were reviewed today:   EKG:  EKG is  ordered today.  The ekg ordered today demonstrates normal sinus rhythm, normal P interval, left ventricle hypertrophy left axis deviation ST segment abnormalities secondary to LVH.  Recent Labs: 03/17/2020: ALT 20; BUN 54; Creatinine 1.8; Hemoglobin 14.5; Platelets 244; Potassium 4.1; Sodium 104  Recent Lipid Panel No results found for: CHOL, TRIG, HDL, CHOLHDL, VLDL, LDLCALC, LDLDIRECT  Physical Exam:    VS:  BP 134/80 (BP Location: Left Arm, Patient Position: Sitting)   Pulse 78   Ht 6\' 5"  (1.956 m)   Wt (!) 383 lb (173.7 kg)   SpO2 94%   BMI 45.42 kg/m     Wt Readings from Last 3 Encounters:  09/13/20 (!) 383 lb (173.7 kg)  08/16/20 (!) 378 lb (171.5 kg)  08/01/20 (!) 388 lb 9.6 oz (176.3 kg)     GEN:  Well nourished, well developed in no acute distress HEENT: Normal NECK: No JVD; No carotid bruits LYMPHATICS: No lymphadenopathy CARDIAC: RRR, no murmurs, no rubs, no gallops RESPIRATORY:  Clear to  auscultation without rales, wheezing or rhonchi  ABDOMEN: Soft, non-tender, non-distended MUSCULOSKELETAL: 1+ swelling and there is some wraps on his legs.; No deformity  SKIN: Warm and dry NEUROLOGIC:  Alert and oriented x 3 PSYCHIATRIC:  Normal affect   ASSESSMENT:    1. Essential hypertension   2. Atypical chest pain   3. Type 2 diabetes mellitus with stage 3b chronic kidney disease, with long-term current use of insulin ([place])   4. Dyslipidemia   5. Morbid obesity with body mass index (BMI) of 40.0 to 49.9 ([facility])   6. Other hemochromatosis    PLAN:    In order of problems listed above:  1. Atypical chest pain.  We had a long discussion about what to do with the situation the obstacle is his kidney dysfunction, therefore coronary CT angio is probably not the best idea, also rushing to cardiac catheterization should also be reconsidered.  I have admitted his pain is somewhat atypical happening at rest however at the same time his ability to exercise is limited and he lives basically sedentary lifestyle.  I think the best course of action would be to do 2 days protocol stress test which we will do in the hospital because of weight limit of our table.  I asked him to start taking metoprolol 25 twice daily, he will also receive nitroglycerin as needed I gave him instruction how to take it and clearly told him to go to the emergency room if pain does not go relieved by 3 nitroglycerin.  I will not put him on aspirin right away he does have hemochromatosis, on top of that he is taking Xarelto which is for a history of PE that he suffered from a which was unprovoked. 2. History of PE, unprovoked years ago continue anticoagulation. 3. Type 2 diabetes followed by internal medicine team, 4. Dyslipidemia, his fasting lipid profile from December showed total cholesterol 420 with direct LDL of 59 however his triglycerides were 2168.  I am worried that he does have poorly controlled diabetes I do not have  latest hemoglobin A1c.  We will make arrangements for the test to be done. 5. I did review test done by primary care physician his BMP was only 35 which  is normalHis troponin I was 11 which is also normal. 6. Obstructive sleep apnea: Use CPAP mask. 7. Morbid obesity obviously big problem he understand him to lose some weight   Medication Adjustments/Labs and Tests Ordered: Current medicines are reviewed at length with the patient today.  Concerns regarding medicines are outlined above.  No orders of the defined types were placed in this encounter.  No orders of the defined types were placed in this encounter.   Signed, Park Liter, MD, [facility]. 09/13/2020 3:34 PM    [place]

## 2020-09-18 ENCOUNTER — Encounter: Payer: Self-pay | Admitting: Cardiology

## 2020-09-20 ENCOUNTER — Encounter: Payer: Self-pay | Admitting: Cardiology

## 2020-09-20 DIAGNOSIS — R079 Chest pain, unspecified: Secondary | ICD-10-CM

## 2020-09-22 ENCOUNTER — Telehealth: Payer: Self-pay | Admitting: Cardiology

## 2020-09-22 NOTE — Telephone Encounter (Signed)
Message addressed.

## 2020-09-22 NOTE — Telephone Encounter (Signed)
Pt is calling to get the results from his test earlier in the week. Please advise

## 2020-09-27 ENCOUNTER — Telehealth: Payer: Self-pay | Admitting: Cardiology

## 2020-09-27 NOTE — Telephone Encounter (Signed)
Pt is calling back about his results from his stress test. Please advise

## 2020-09-27 NOTE — Telephone Encounter (Signed)
Test was done at [facility]. Results in Dr. Wendy Poet folder. Patient aware once he reviews I will inform him of results.

## 2020-09-28 NOTE — Telephone Encounter (Signed)
Patient notified of test results 

## 2020-10-30 ENCOUNTER — Other Ambulatory Visit: Payer: Self-pay

## 2020-10-30 ENCOUNTER — Inpatient Hospital Stay (HOSPITAL_COMMUNITY)
Admission: EM | Admit: 2020-10-30 | Discharge: 2020-11-04 | DRG: 872 | Disposition: A | Discharge status: Home / Self Care | Payer: PRIVATE HEALTH INSURANCE | Attending: Internal Medicine | Admitting: Internal Medicine

## 2020-10-30 ENCOUNTER — Emergency Department (HOSPITAL_COMMUNITY): Payer: PRIVATE HEALTH INSURANCE

## 2020-10-30 ENCOUNTER — Encounter (HOSPITAL_COMMUNITY): Payer: Self-pay | Admitting: Emergency Medicine

## 2020-10-30 DIAGNOSIS — Z86711 Personal history of pulmonary embolism: Secondary | ICD-10-CM

## 2020-10-30 DIAGNOSIS — N1832 Chronic kidney disease, stage 3b: Secondary | ICD-10-CM | POA: Diagnosis present

## 2020-10-30 DIAGNOSIS — K7581 Nonalcoholic steatohepatitis (NASH): Secondary | ICD-10-CM | POA: Diagnosis present

## 2020-10-30 DIAGNOSIS — E1122 Type 2 diabetes mellitus with diabetic chronic kidney disease: Secondary | ICD-10-CM | POA: Diagnosis present

## 2020-10-30 DIAGNOSIS — B951 Streptococcus, group B, as the cause of diseases classified elsewhere: Secondary | ICD-10-CM

## 2020-10-30 DIAGNOSIS — A401 Sepsis due to streptococcus, group B: Secondary | ICD-10-CM | POA: Diagnosis present

## 2020-10-30 DIAGNOSIS — I5022 Chronic systolic (congestive) heart failure: Secondary | ICD-10-CM | POA: Diagnosis present

## 2020-10-30 DIAGNOSIS — G4730 Sleep apnea, unspecified: Secondary | ICD-10-CM | POA: Diagnosis present

## 2020-10-30 DIAGNOSIS — Z7984 Long term (current) use of oral hypoglycemic drugs: Secondary | ICD-10-CM

## 2020-10-30 DIAGNOSIS — K219 Gastro-esophageal reflux disease without esophagitis: Secondary | ICD-10-CM | POA: Diagnosis present

## 2020-10-30 DIAGNOSIS — Z794 Long term (current) use of insulin: Secondary | ICD-10-CM

## 2020-10-30 DIAGNOSIS — I10 Essential (primary) hypertension: Secondary | ICD-10-CM | POA: Diagnosis present

## 2020-10-30 DIAGNOSIS — L97512 Non-pressure chronic ulcer of other part of right foot with fat layer exposed: Secondary | ICD-10-CM | POA: Diagnosis present

## 2020-10-30 DIAGNOSIS — R0602 Shortness of breath: Secondary | ICD-10-CM

## 2020-10-30 DIAGNOSIS — D631 Anemia in chronic kidney disease: Secondary | ICD-10-CM | POA: Diagnosis present

## 2020-10-30 DIAGNOSIS — Z87891 Personal history of nicotine dependence: Secondary | ICD-10-CM

## 2020-10-30 DIAGNOSIS — A419 Sepsis, unspecified organism: Secondary | ICD-10-CM

## 2020-10-30 DIAGNOSIS — L0390 Cellulitis, unspecified: Secondary | ICD-10-CM

## 2020-10-30 DIAGNOSIS — Z8616 Personal history of COVID-19: Secondary | ICD-10-CM | POA: Diagnosis not present

## 2020-10-30 DIAGNOSIS — G4731 Primary central sleep apnea: Secondary | ICD-10-CM | POA: Diagnosis present

## 2020-10-30 DIAGNOSIS — Z23 Encounter for immunization: Secondary | ICD-10-CM

## 2020-10-30 DIAGNOSIS — Z20822 Contact with and (suspected) exposure to covid-19: Secondary | ICD-10-CM | POA: Diagnosis present

## 2020-10-30 DIAGNOSIS — J4530 Mild persistent asthma, uncomplicated: Secondary | ICD-10-CM | POA: Diagnosis present

## 2020-10-30 DIAGNOSIS — L03115 Cellulitis of right lower limb: Secondary | ICD-10-CM | POA: Diagnosis present

## 2020-10-30 DIAGNOSIS — I130 Hypertensive heart and chronic kidney disease with heart failure and stage 1 through stage 4 chronic kidney disease, or unspecified chronic kidney disease: Secondary | ICD-10-CM | POA: Diagnosis present

## 2020-10-30 DIAGNOSIS — I87331 Chronic venous hypertension (idiopathic) with ulcer and inflammation of right lower extremity: Secondary | ICD-10-CM | POA: Diagnosis present

## 2020-10-30 DIAGNOSIS — N179 Acute kidney failure, unspecified: Secondary | ICD-10-CM | POA: Diagnosis present

## 2020-10-30 DIAGNOSIS — Z7901 Long term (current) use of anticoagulants: Secondary | ICD-10-CM

## 2020-10-30 DIAGNOSIS — L03119 Cellulitis of unspecified part of limb: Secondary | ICD-10-CM

## 2020-10-30 DIAGNOSIS — Z79899 Other long term (current) drug therapy: Secondary | ICD-10-CM

## 2020-10-30 DIAGNOSIS — M19042 Primary osteoarthritis, left hand: Secondary | ICD-10-CM | POA: Diagnosis present

## 2020-10-30 DIAGNOSIS — E782 Mixed hyperlipidemia: Secondary | ICD-10-CM | POA: Diagnosis present

## 2020-10-30 DIAGNOSIS — I878 Other specified disorders of veins: Secondary | ICD-10-CM | POA: Diagnosis present

## 2020-10-30 DIAGNOSIS — L97919 Non-pressure chronic ulcer of unspecified part of right lower leg with unspecified severity: Secondary | ICD-10-CM

## 2020-10-30 DIAGNOSIS — E11621 Type 2 diabetes mellitus with foot ulcer: Secondary | ICD-10-CM | POA: Diagnosis present

## 2020-10-30 DIAGNOSIS — R7881 Bacteremia: Secondary | ICD-10-CM

## 2020-10-30 HISTORY — DX: Sepsis, unspecified organism: A41.9

## 2020-10-30 LAB — BLOOD CULTURE ID PANEL (REFLEXED) - BCID2

## 2020-10-30 LAB — COMPREHENSIVE METABOLIC PANEL
ALT: 24 U/L (ref 0–44)
AST: 24 U/L (ref 15–41)
Albumin: 2.9 g/dL — ABNORMAL LOW (ref 3.5–5.0)
Alkaline Phosphatase: 52 U/L (ref 38–126)
Anion gap: 11 (ref 5–15)
BUN: 37 mg/dL — ABNORMAL HIGH (ref 6–20)
CO2: 24 mmol/L (ref 22–32)
Calcium: 8.7 mg/dL — ABNORMAL LOW (ref 8.9–10.3)
Chloride: 103 mmol/L (ref 98–111)
Creatinine, Ser: 2.07 mg/dL — ABNORMAL HIGH (ref 0.61–1.24)
GFR, Estimated: 37 mL/min — ABNORMAL LOW (ref >60)
Glucose, Bld: 334 mg/dL — ABNORMAL HIGH (ref 70–99)
Potassium: 3.5 mmol/L (ref 3.5–5.1)
Sodium: 138 mmol/L (ref 135–145)
Total Bilirubin: 0.8 mg/dL (ref 0.3–1.2)
Total Protein: 6.5 g/dL (ref 6.5–8.1)

## 2020-10-30 LAB — CBC WITH DIFFERENTIAL/PLATELET
Abs Immature Granulocytes: 0.13 10*3/uL — ABNORMAL HIGH (ref 0.00–0.07)
Basophils Absolute: 0.1 10*3/uL (ref 0.0–0.1)
Basophils Relative: 0 %
Eosinophils Absolute: 0.3 10*3/uL (ref 0.0–0.5)
Eosinophils Relative: 2 %
HCT: 38.6 % — ABNORMAL LOW (ref 39.0–52.0)
Hemoglobin: 13.1 g/dL (ref 13.0–17.0)
Immature Granulocytes: 1 %
Lymphocytes Relative: 7 %
Lymphs Abs: 0.9 10*3/uL (ref 0.7–4.0)
MCH: 32.5 pg (ref 26.0–34.0)
MCHC: 33.9 g/dL (ref 30.0–36.0)
MCV: 95.8 fL (ref 80.0–100.0)
Monocytes Absolute: 0.6 10*3/uL (ref 0.1–1.0)
Monocytes Relative: 5 %
Neutro Abs: 11.4 10*3/uL — ABNORMAL HIGH (ref 1.7–7.7)
Neutrophils Relative %: 85 %
Platelets: 201 10*3/uL (ref 150–400)
RBC: 4.03 MIL/uL — ABNORMAL LOW (ref 4.22–5.81)
RDW: 13.4 % (ref 11.5–15.5)
WBC: 13.3 10*3/uL — ABNORMAL HIGH (ref 4.0–10.5)
nRBC: 0 % (ref 0.0–0.2)

## 2020-10-30 LAB — RESP PANEL BY RT-PCR (FLU A&B, COVID) ARPGX2
Influenza A by PCR: NEGATIVE
Influenza B by PCR: NEGATIVE
SARS Coronavirus 2 by RT PCR: NEGATIVE

## 2020-10-30 LAB — URINALYSIS, ROUTINE W REFLEX MICROSCOPIC
Bacteria, UA: NONE SEEN
Bilirubin Urine: NEGATIVE
Glucose, UA: >=500 mg/dL — AB
Ketones, ur: 5 mg/dL — AB
Leukocytes,Ua: NEGATIVE
Nitrite: NEGATIVE
Protein, ur: >=300 mg/dL — AB
Specific Gravity, Urine: 1.015 (ref 1.005–1.030)
pH: 6 (ref 5.0–8.0)

## 2020-10-30 LAB — RAPID URINE DRUG SCREEN, HOSP PERFORMED
Amphetamines: NOT DETECTED
Barbiturates: NOT DETECTED
Benzodiazepines: NOT DETECTED
Cocaine: NOT DETECTED
Opiates: NOT DETECTED
Tetrahydrocannabinol: NOT DETECTED

## 2020-10-30 LAB — TROPONIN I (HIGH SENSITIVITY)
Troponin I (High Sensitivity): 42 ng/L — ABNORMAL HIGH (ref <18)
Troponin I (High Sensitivity): 90 ng/L — ABNORMAL HIGH (ref <18)

## 2020-10-30 LAB — PROTIME-INR
INR: 1 (ref 0.8–1.2)
Prothrombin Time: 13.5 seconds (ref 11.4–15.2)

## 2020-10-30 LAB — LACTIC ACID, PLASMA
Lactic Acid, Venous: 2.9 mmol/L (ref 0.5–1.9)
Lactic Acid, Venous: 3.9 mmol/L (ref 0.5–1.9)
Lactic Acid, Venous: 2.4 mmol/L (ref 0.5–1.9)
Lactic Acid, Venous: 2.5 mmol/L (ref 0.5–1.9)

## 2020-10-30 LAB — BRAIN NATRIURETIC PEPTIDE: B Natriuretic Peptide: 37.3 pg/mL (ref 0.0–100.0)

## 2020-10-30 LAB — APTT: aPTT: 24 seconds (ref 24–36)

## 2020-10-30 LAB — MAGNESIUM: Magnesium: 1.7 mg/dL (ref 1.7–2.4)

## 2020-10-30 LAB — GLUCOSE, CAPILLARY
Glucose-Capillary: 341 mg/dL — ABNORMAL HIGH (ref 70–99)
Glucose-Capillary: 318 mg/dL — ABNORMAL HIGH (ref 70–99)

## 2020-10-30 MED ORDER — MAGNESIUM SULFATE 2 GM/50ML IV SOLN
2.0000 g | Freq: Once | INTRAVENOUS | Status: AC
  Administered 2020-10-30: 2 g via INTRAVENOUS
  Filled 2020-10-30: qty 50

## 2020-10-30 MED ORDER — ISOSORBIDE MONONITRATE ER 30 MG PO TB24
30.0000 mg | ORAL_TABLET | Freq: Every day | ORAL | Status: DC
  Administered 2020-10-30 – 2020-11-04 (×6): 30 mg via ORAL
  Filled 2020-10-30 (×6): qty 1

## 2020-10-30 MED ORDER — TETANUS-DIPHTH-ACELL PERTUSSIS 5-2.5-18.5 LF-MCG/0.5 IM SUSY
0.5000 mL | PREFILLED_SYRINGE | Freq: Once | INTRAMUSCULAR | Status: AC
  Administered 2020-10-30: 0.5 mL via INTRAMUSCULAR
  Filled 2020-10-30: qty 0.5

## 2020-10-30 MED ORDER — EMPAGLIFLOZIN 25 MG PO TABS
25.0000 mg | ORAL_TABLET | Freq: Every day | ORAL | Status: DC
  Administered 2020-10-30 – 2020-11-04 (×6): 25 mg via ORAL
  Filled 2020-10-30 (×6): qty 1

## 2020-10-30 MED ORDER — ONDANSETRON HCL 4 MG/2ML IJ SOLN
4.0000 mg | Freq: Four times a day (QID) | INTRAMUSCULAR | Status: DC | PRN
  Administered 2020-10-30: 4 mg via INTRAVENOUS
  Filled 2020-10-30: qty 2

## 2020-10-30 MED ORDER — LACTATED RINGERS IV SOLN: INTRAVENOUS | Status: AC

## 2020-10-30 MED ORDER — SODIUM CHLORIDE 0.9 % IV SOLN
1000.0000 mL | INTRAVENOUS | Status: DC
  Administered 2020-10-30: 1000 mL via INTRAVENOUS

## 2020-10-30 MED ORDER — HYDROCHLOROTHIAZIDE 12.5 MG PO CAPS
12.5000 mg | ORAL_CAPSULE | Freq: Every day | ORAL | Status: DC
  Administered 2020-10-30 – 2020-11-01 (×3): 12.5 mg via ORAL
  Filled 2020-10-30 (×3): qty 1

## 2020-10-30 MED ORDER — ONDANSETRON HCL 4 MG/2ML IJ SOLN
4.0000 mg | Freq: Once | INTRAMUSCULAR | Status: AC
  Administered 2020-10-30: 4 mg via INTRAVENOUS
  Filled 2020-10-30: qty 2

## 2020-10-30 MED ORDER — INSULIN REGULAR HUMAN (CONC) 500 UNIT/ML ~~LOC~~ SOPN
30.0000 [IU] | PEN_INJECTOR | Freq: Three times a day (TID) | SUBCUTANEOUS | Status: DC
  Administered 2020-10-30 – 2020-11-04 (×16): 30 [IU] via SUBCUTANEOUS
  Filled 2020-10-30: qty 3

## 2020-10-30 MED ORDER — NITROGLYCERIN 0.4 MG SL SUBL: 0.4000 mg | SUBLINGUAL_TABLET | SUBLINGUAL | Status: DC | PRN

## 2020-10-30 MED ORDER — TORSEMIDE 20 MG PO TABS: 20.0000 mg | ORAL_TABLET | Freq: Every day | ORAL | Status: DC | PRN

## 2020-10-30 MED ORDER — INSULIN REGULAR HUMAN (CONC) 500 UNIT/ML ~~LOC~~ SOLN: 30.0000 [IU] | Freq: Three times a day (TID) | SUBCUTANEOUS | Status: DC

## 2020-10-30 MED ORDER — METRONIDAZOLE 500 MG/100ML IV SOLN
500.0000 mg | Freq: Once | INTRAVENOUS | Status: AC
  Administered 2020-10-30: 500 mg via INTRAVENOUS
  Filled 2020-10-30: qty 100

## 2020-10-30 MED ORDER — VITAMIN D (ERGOCALCIFEROL) 1.25 MG (50000 UNIT) PO CAPS
50000.0000 [IU] | ORAL_CAPSULE | ORAL | Status: DC
  Administered 2020-10-30: 50000 [IU] via ORAL
  Filled 2020-10-30: qty 1

## 2020-10-30 MED ORDER — SODIUM CHLORIDE 0.9 % IV SOLN
2.0000 g | Freq: Once | INTRAVENOUS | Status: AC
  Administered 2020-10-30: 2 g via INTRAVENOUS
  Filled 2020-10-30: qty 2

## 2020-10-30 MED ORDER — LACTATED RINGERS IV BOLUS
1000.0000 mL | Freq: Once | INTRAVENOUS | Status: AC
  Administered 2020-10-30: 1000 mL via INTRAVENOUS

## 2020-10-30 MED ORDER — VANCOMYCIN HCL 2000 MG/400ML IV SOLN
2000.0000 mg | INTRAVENOUS | Status: DC
  Filled 2020-10-30: qty 400

## 2020-10-30 MED ORDER — MUPIROCIN 2 % EX OINT
TOPICAL_OINTMENT | Freq: Two times a day (BID) | CUTANEOUS | Status: DC
  Filled 2020-10-30 (×3): qty 22

## 2020-10-30 MED ORDER — LISINOPRIL-HYDROCHLOROTHIAZIDE 20-12.5 MG PO TABS: 1.0000 | ORAL_TABLET | Freq: Every day | ORAL | Status: DC

## 2020-10-30 MED ORDER — LISINOPRIL 20 MG PO TABS
20.0000 mg | ORAL_TABLET | Freq: Every day | ORAL | Status: DC
  Administered 2020-10-30 – 2020-11-01 (×3): 20 mg via ORAL
  Filled 2020-10-30 (×3): qty 1

## 2020-10-30 MED ORDER — INSULIN ASPART 100 UNIT/ML IJ SOLN: 20.0000 [IU] | Freq: Three times a day (TID) | INTRAMUSCULAR | Status: DC

## 2020-10-30 MED ORDER — VANCOMYCIN HCL 10 G IV SOLR
2500.0000 mg | Freq: Once | INTRAVENOUS | Status: AC
  Administered 2020-10-30: 2500 mg via INTRAVENOUS
  Filled 2020-10-30: qty 2500

## 2020-10-30 MED ORDER — ACETAMINOPHEN 325 MG PO TABS
650.0000 mg | ORAL_TABLET | Freq: Once | ORAL | Status: AC
  Administered 2020-10-30: 650 mg via ORAL
  Filled 2020-10-30: qty 2

## 2020-10-30 MED ORDER — SODIUM CHLORIDE 0.9 % IV BOLUS (SEPSIS)
1000.0000 mL | Freq: Once | INTRAVENOUS | Status: DC
  Administered 2020-10-30: 1000 mL via INTRAVENOUS

## 2020-10-30 MED ORDER — ACETAMINOPHEN 650 MG RE SUPP: 650.0000 mg | Freq: Four times a day (QID) | RECTAL | Status: DC | PRN

## 2020-10-30 MED ORDER — ACETAMINOPHEN 325 MG PO TABS
650.0000 mg | ORAL_TABLET | Freq: Four times a day (QID) | ORAL | Status: DC | PRN
  Administered 2020-10-30: 650 mg via ORAL
  Filled 2020-10-30: qty 2

## 2020-10-30 MED ORDER — PNEUMOCOCCAL VAC POLYVALENT 25 MCG/0.5ML IJ INJ
0.5000 mL | INJECTION | INTRAMUSCULAR | Status: DC
  Filled 2020-10-30: qty 0.5

## 2020-10-30 MED ORDER — METRONIDAZOLE 500 MG/100ML IV SOLN
500.0000 mg | Freq: Three times a day (TID) | INTRAVENOUS | Status: DC
  Administered 2020-10-30 (×2): 500 mg via INTRAVENOUS
  Filled 2020-10-30 (×2): qty 100

## 2020-10-30 MED ORDER — LACTATED RINGERS IV BOLUS (SEPSIS)
1000.0000 mL | Freq: Once | INTRAVENOUS | Status: AC
  Administered 2020-10-30: 1000 mL via INTRAVENOUS

## 2020-10-30 MED ORDER — INSULIN ASPART (W/NIACINAMIDE) 100 UNIT/ML ~~LOC~~ SOPN: 20.0000 [IU] | PEN_INJECTOR | Freq: Three times a day (TID) | SUBCUTANEOUS | Status: DC

## 2020-10-30 MED ORDER — SODIUM CHLORIDE 0.9 % IV SOLN
2.0000 g | Freq: Three times a day (TID) | INTRAVENOUS | Status: DC
  Administered 2020-10-30: 2 g via INTRAVENOUS
  Filled 2020-10-30: qty 2

## 2020-10-30 MED ORDER — RIVAROXABAN 20 MG PO TABS
20.0000 mg | ORAL_TABLET | Freq: Every day | ORAL | Status: DC
  Administered 2020-10-30 – 2020-11-03 (×5): 20 mg via ORAL
  Filled 2020-10-30 (×5): qty 1

## 2020-10-30 MED ORDER — PENICILLIN G POTASSIUM 20000000 UNITS IJ SOLR
4.0000 10*6.[IU] | INTRAVENOUS | Status: DC
  Administered 2020-10-30 – 2020-11-04 (×29): 4 10*6.[IU] via INTRAVENOUS
  Filled 2020-10-30 (×36): qty 4

## 2020-10-30 MED ORDER — ONDANSETRON HCL 4 MG PO TABS: 4.0000 mg | ORAL_TABLET | Freq: Four times a day (QID) | ORAL | Status: DC | PRN

## 2020-10-30 MED ORDER — CARVEDILOL 25 MG PO TABS
25.0000 mg | ORAL_TABLET | Freq: Two times a day (BID) | ORAL | Status: DC
  Administered 2020-10-30 – 2020-11-04 (×11): 25 mg via ORAL
  Filled 2020-10-30 (×9): qty 1
  Filled 2020-10-30: qty 8
  Filled 2020-10-30: qty 1

## 2020-10-30 NOTE — ED Provider Notes (Signed)
[facility] EMERGENCY DEPARTMENT Provider Note   CSN: [id number] Arrival date & time: 10/30/20  0200     History Chief Complaint  Patient presents with  . Shortness of Breath    Sean Valentine is a 55 y.o. male presents to the Emergency Department complaining of acute, persistent shortness of breath.  Patient reports he awoke from sleep feeling ill with acute onset shortness of breath and palpitations.  He reports a history of CHF.  Patient denies sick contacts.  He was recently hospitalized for COVID-19 and given monoclonal antibodies.  Denies fevers, chills, headache, neck pain, chest pain, abdominal pain, weakness, dizziness, syncope.  Patient does endorse associated nausea and vomiting multiple times.  EMS reports patient was diaphoretic upon their arrival with mild respiratory distress.  No specific aggravating or alleviating factors.  No treatments prior to arrival.  The history is provided by the patient, medical records and the spouse. No language interpreter was used.       Past Medical History:  Diagnosis Date  . Anemia, chronic disease 05/26/2018   Formatting of this note might be different from the original. Eval with [facility] and phlebotomy for Hemochromotosis.  . Anticoagulant long-term use 11/26/2016  . Arthritis of right foot 05/08/2016  . Benign hypertension with CKD (chronic kidney disease) stage III ([place]) 07/12/2019  . Bilateral leg edema   . Chest pain at rest 08/16/2020  . CHF (congestive heart failure) ([place]) 03/05/2012  . Deformity of toe 08/01/2020  . Diabetic ulcer of toe of right foot associated with type 2 diabetes mellitus, with fat layer exposed ([place]) 01/10/2020   Formatting of this note might be different from the original. Distal tuft right fifth toe  . Dyslipidemia 03/05/2012  . Essential hypertension 11/26/2016  . GERD (gastroesophageal reflux disease) 08/01/2020  . Gouty arthritis of left hand 01/15/2017  . High risk medication use 11/26/2016  .  History of COVID-19 05/05/2020   Formatting of this note might be different from the original. 02/2020  . History of pulmonary embolism 11/26/2016   Formatting of this note might be different from the original. Felt severe with permanent anticoagulation and Filter placed because of burden.  . Hypertension   . Localized edema 03/05/2012  . LVH (left ventricular hypertrophy) 05/26/2018   Formatting of this note might be different from the original. 04/2018 EF 555-60%  . Malaise and fatigue 11/26/2016  . MGUS (monoclonal gammopathy of unknown significance) 06/03/2018   Formatting of this note might be different from the original. Possible. Seen on SPEP 05/2018  . Mild persistent asthma 11/26/2016  . Mixed hyperlipidemia 11/26/2016  . Morbid (severe) obesity due to excess calories ([place]) 03/05/2012  . Morbid obesity with body mass index (BMI) of 40.0 to 49.9 ([place]) 11/26/2016  . Nail dystrophy 05/20/2018  . Nephrolithiasis 03/05/2012  . Nonalcoholic steatohepatitis (NASH) 05/26/2018  . Other hemochromatosis 03/21/2020  . Posterior tibial tendinitis of right lower extremity 08/01/2020  . Pre-ulcerative calluses 06/18/2019  . Primary central sleep apnea 11/26/2016   Formatting of this note might be different from the original. bipap  . Sleep apnea 03/05/2012   Formatting of this note might be different from the original. [place].  Marland Kitchen Spinal stenosis of lumbar region 05/26/2018  . Status post amputation of toe of right foot ([place]) 07/26/2015  . Subfibular impingement, right 03/13/2016  . Tinea pedis 08/01/2020  . Type 2 diabetes mellitus with stage 3b chronic kidney disease, with long-term current use of insulin ([place]) 07/12/2019  Formatting of this note might be different from the original. IMO 10/01 Updates  Formatting of this note might be different from the original. 2000    Patient Active Problem List   Diagnosis Date Noted  . Atypical chest pain 09/13/2020  . Chest pain at rest 08/16/2020  . Deformity of  toe 08/01/2020  . GERD (gastroesophageal reflux disease) 08/01/2020  . Posterior tibial tendinitis of right lower extremity 08/01/2020  . Tinea pedis 08/01/2020  . History of COVID-19 05/05/2020  . Other hemochromatosis 03/21/2020  . COVID-19 virus infection 02/09/2020  . Diabetic ulcer of toe of right foot associated with type 2 diabetes mellitus, with fat layer exposed ([place]) 01/10/2020  . Benign hypertension with CKD (chronic kidney disease) stage III ([place]) 07/12/2019  . Type 2 diabetes mellitus with stage 3b chronic kidney disease, with long-term current use of insulin ([place]) 07/12/2019  . Pre-ulcerative calluses 06/18/2019  . MGUS (monoclonal gammopathy of unknown significance) 06/03/2018  . Anemia, chronic disease 05/26/2018  . LVH (left ventricular hypertrophy) 05/26/2018  . Nonalcoholic steatohepatitis (NASH) 05/26/2018  . Spinal stenosis of lumbar region 05/26/2018  . Nail dystrophy 05/20/2018  . Gouty arthritis of left hand 01/15/2017  . Anticoagulant long-term use 11/26/2016  . Essential hypertension 11/26/2016  . High risk medication use 11/26/2016  . History of pulmonary embolism 11/26/2016  . Malaise and fatigue 11/26/2016  . Mild persistent asthma 11/26/2016  . Mixed hyperlipidemia 11/26/2016  . Primary central sleep apnea 11/26/2016  . Morbid obesity with body mass index (BMI) of 40.0 to 49.9 ([place]) 11/26/2016  . Arthritis of right foot 05/08/2016  . Subfibular impingement, right 03/13/2016  . Status post amputation of toe of right foot ([place]) 07/26/2015  . CHF (congestive heart failure) ([place]) 03/05/2012  . Dyslipidemia 03/05/2012  . Localized edema 03/05/2012  . Morbid (severe) obesity due to excess calories ([place]) 03/05/2012  . Nephrolithiasis 03/05/2012  . Sleep apnea 03/05/2012    Past Surgical History:  Procedure Laterality Date  . CHOLECYSTECTOMY    . CYSTOSCOPY W/ URETERAL STENT PLACEMENT Left 05/06/2005  . FOOT SURGERY Right    Big toe amputation  . IVC  FILTER INSERTION  08/2016  . KIDNEY STONE SURGERY    . SHOULDER SURGERY Left        Family History  Problem Relation Age of Onset  . Cancer Mother   . Clotting disorder Mother   . Stroke Mother   . Alzheimer's disease Mother   . Diabetes Mother   . Diabetes Father     Social History   Tobacco Use  . Smoking status: Former Smoker    Years: 1.00    Quit date: 1986    Years since quitting: 36.4  . Smokeless tobacco: Never Used    Home Medications Prior to Admission medications   Medication Sig Start Date End Date Taking? Authorizing Provider  amLODipine (NORVASC) 5 MG tablet Take 5 mg by mouth daily as needed (high blood pressure). 09/07/17  Yes [provider]  carvedilol (COREG) 25 MG tablet Take 25 mg by mouth 2 (two) times daily with a meal. 07/12/19  Yes [provider]  empagliflozin (JARDIANCE) 25 MG TABS tablet Take 25 mg by mouth daily. 03/24/19  Yes [provider]  ergocalciferol (VITAMIN D2) 1.25 MG (50000 UT) capsule Take 1 capsule by mouth every Monday. 07/13/20  Yes [provider]  insulin aspart (FIASP FLEXTOUCH) 100 UNIT/ML FlexTouch Pen Inject 20 Units into the skin 3 (three) times daily. Inject 20  units into the skin before each meal 03/21/20  Yes [provider]  insulin regular human CONCENTRATED (HUMULIN R) 500 UNIT/ML injection Inject 30 Units into the skin 3 (three) times daily with meals. 11/29/17  Yes [provider]  isosorbide mononitrate (IMDUR) 30 MG 24 hr tablet Take 1 tablet (30 mg total) by mouth daily. 09/13/20  Yes Park Liter, MD  lisinopril-hydrochlorothiazide (ZESTORETIC) 20-12.5 MG tablet Take 1 tablet by mouth daily. 03/13/17  Yes [provider]  nitroGLYCERIN (NITROSTAT) 0.4 MG SL tablet Place 1 tablet (0.4 mg total) under the tongue every 5 (five) minutes as needed for chest pain. 09/13/20 12/12/20 Yes Park Liter, MD  rivaroxaban (XARELTO) 20 MG TABS tablet Take 20 mg  by mouth daily with supper.   Yes [provider]  torsemide (DEMADEX) 20 MG tablet Take 20 mg by mouth daily as needed (for swelling). 06/01/20  Yes [provider]  vitamin B-12 (CYANOCOBALAMIN) 1000 MCG tablet Take 1,000 mcg by mouth every Monday.   Yes [provider]  ciprofloxacin (CIPRO) 500 MG tablet Take 500 mg by mouth 2 (two) times daily. Patient not taking: No sig reported    [provider]    Allergies    Patient has no known allergies.  Review of Systems   Review of Systems  Constitutional: Positive for fatigue. Negative for appetite change, diaphoresis, fever and unexpected weight change.  HENT: Negative for mouth sores.   Eyes: Negative for visual disturbance.  Respiratory: Positive for shortness of breath. Negative for cough, chest tightness and wheezing.   Cardiovascular: Positive for palpitations. Negative for chest pain.  Gastrointestinal: Positive for nausea and vomiting. Negative for abdominal pain, constipation and diarrhea.  Endocrine: Negative for polydipsia, polyphagia and polyuria.  Genitourinary: Negative for dysuria, frequency, hematuria and urgency.  Musculoskeletal: Negative for back pain and neck stiffness.  Skin: Negative for rash.  Allergic/Immunologic: Negative for immunocompromised state.  Neurological: Negative for syncope, light-headedness and headaches.  Hematological: Does not bruise/bleed easily.  Psychiatric/Behavioral: Negative for sleep disturbance. The patient is not nervous/anxious.     Physical Exam Updated Vital Signs BP (!) 165/88 (BP Location: Right Arm)   Pulse (!) 109   Temp (!) 103.5 F (39.7 C)   Resp (!) 26   Ht 6\' 5"  (1.956 m)   Wt (!) 172.4 kg   SpO2 97%   BMI 45.06 kg/m   Physical Exam Vitals and nursing note reviewed.  Constitutional:      General: He is not in acute distress.    Appearance: He is ill-appearing. He is not diaphoretic.  HENT:     Head: Normocephalic.      Nose: Nose normal.     Mouth/Throat:     Mouth: Mucous membranes are moist.  Eyes:     General: No scleral icterus.    Conjunctiva/sclera: Conjunctivae normal.  Cardiovascular:     Rate and Rhythm: Regular rhythm. Tachycardia present.     Pulses: Normal pulses.          Radial pulses are 2+ on the right side and 2+ on the left side.  Pulmonary:     Effort: Tachypnea and accessory muscle usage present. No prolonged expiration, respiratory distress or retractions.     Breath sounds: No stridor. Decreased breath sounds present.     Comments: Equal chest rise. No increased work of breathing. Abdominal:     General: Bowel sounds are normal. There is no distension.     Palpations: Abdomen  is soft.     Tenderness: There is no abdominal tenderness. There is no guarding or rebound.  Musculoskeletal:        General: Swelling and tenderness present.     Cervical back: Normal range of motion.     Comments: Moves all extremities equally and without difficulty.  Skin:    General: Skin is warm and dry.     Capillary Refill: Capillary refill takes less than 2 seconds.     Findings: Lesion present.     Comments: See photos of lesions  Neurological:     General: No focal deficit present.     Mental Status: He is alert.     GCS: GCS eye subscore is 4. GCS verbal subscore is 5. GCS motor subscore is 6.     Comments: Speech is clear and goal oriented.  Psychiatric:        Mood and Affect: Mood normal.                ED Results / Procedures / Treatments   Labs (all labs ordered are listed, but only abnormal results are displayed) Labs Reviewed  COMPREHENSIVE METABOLIC PANEL - Abnormal; Notable for the following components:      Result Value   Glucose, Bld 334 (*)    BUN 37 (*)    Creatinine, Ser 2.07 (*)    Calcium 8.7 (*)    Albumin 2.9 (*)    GFR, Estimated 37 (*)    All other components within normal limits  CBC WITH DIFFERENTIAL/PLATELET - Abnormal; Notable for the  following components:   WBC 13.3 (*)    RBC 4.03 (*)    HCT 38.6 (*)    Neutro Abs 11.4 (*)    Abs Immature Granulocytes 0.13 (*)    All other components within normal limits  URINALYSIS, ROUTINE W REFLEX MICROSCOPIC - Abnormal; Notable for the following components:   Color, Urine STRAW (*)    Glucose, UA >=500 (*)    Hgb urine dipstick SMALL (*)    Ketones, ur 5 (*)    Protein, ur >=300 (*)    All other components within normal limits  LACTIC ACID, PLASMA - Abnormal; Notable for the following components:   Lactic Acid, Venous 2.9 (*)    All other components within normal limits  LACTIC ACID, PLASMA - Abnormal; Notable for the following components:   Lactic Acid, Venous 3.9 (*)    All other components within normal limits  TROPONIN I (HIGH SENSITIVITY) - Abnormal; Notable for the following components:   Troponin I (High Sensitivity) 42 (*)    All other components within normal limits  RESP PANEL BY RT-PCR (FLU A&B, COVID) ARPGX2  CULTURE, BLOOD (ROUTINE X 2)  CULTURE, BLOOD (ROUTINE X 2)  URINE CULTURE  MAGNESIUM  BRAIN NATRIURETIC PEPTIDE  RAPID URINE DRUG SCREEN, HOSP PERFORMED  PROTIME-INR  APTT  CBG MONITORING, ED  TROPONIN I (HIGH SENSITIVITY)    EKG EKG Interpretation  Date/Time:  Monday Oct 30 2020 02:06:47 EDT Ventricular Rate:  110 PR Interval:  171 QRS Duration: 116 QT Interval:  352 QTC Calculation: 477 R Axis:   -68 Text Interpretation: Sinus tachycardia LVH with IVCD, LAD and secondary repol abnrm Confirmed by Randal Buba, April ([id number]) on 10/30/2020 2:13:47 AM   Radiology DG Chest Portable 1 View  Result Date: 10/30/2020 CLINICAL DATA:  Chest pain, tachycardia, diaphoresis EXAM: PORTABLE CHEST 1 VIEW COMPARISON:  04/27/2018 FINDINGS: Two frontal views of the chest demonstrate an unremarkable  cardiac silhouette. No acute airspace disease, effusion, or pneumothorax. Stable bilateral calcified granuloma. No acute bony abnormalities. IMPRESSION: 1. No acute  intrathoracic process. Electronically Signed   By: Randa Ngo M.D.   On: 10/30/2020 03:01   DG Foot Complete Right  Result Date: 10/30/2020 CLINICAL DATA:  Pain, puncture wound EXAM: RIGHT FOOT COMPLETE - 3+ VIEW COMPARISON:  None. FINDINGS: No fracture or malalignment. Status post amputation of first digit at the level of the IP joint. Moderate degenerative change at the first MTP joint. Tiny focus of probable gas within the mid sole of the foot presumably due to puncture wound. No periostitis or bony destructive change. Dorsal degenerative changes of the midfoot IMPRESSION: Partial amputation of first digit.  No acute osseous abnormality. Electronically Signed   By: Donavan Foil M.D.   On: 10/30/2020 03:57    Procedures .Critical Care Performed by: Abigail Butts, PA-C Authorized by: Abigail Butts, PA-C   Critical care provider statement:    Critical care time (minutes):  45   Critical care time was exclusive of:  Separately billable procedures and treating other patients and teaching time   Critical care was necessary to treat or prevent imminent or life-threatening deterioration of the following conditions:  Sepsis   Critical care was time spent personally by me on the following activities:  Discussions with consultants, evaluation of patient's response to treatment, examination of patient, ordering and performing treatments and interventions, ordering and review of laboratory studies, ordering and review of radiographic studies, pulse oximetry, re-evaluation of patient's condition, obtaining history from patient or surrogate and review of old charts   I assumed direction of critical care for this patient from another provider in my specialty: no     Care discussed with: admitting provider       Medications Ordered in ED Medications  lactated ringers infusion ( Intravenous New Bag/Given 10/30/20 0239)  vancomycin (VANCOCIN) 2,500 mg in sodium chloride 0.9 % 500 mL IVPB  (2,500 mg Intravenous New Bag/Given 10/30/20 0405)  sodium chloride 0.9 % bolus 1,000 mL (1,000 mLs Intravenous New Bag/Given 10/30/20 0517)    Followed by  0.9 %  sodium chloride infusion (1,000 mLs Intravenous New Bag/Given 10/30/20 0517)  ondansetron (ZOFRAN) injection 4 mg (has no administration in time range)  acetaminophen (TYLENOL) tablet 650 mg (650 mg Oral Given 10/30/20 0238)  ceFEPIme (MAXIPIME) 2 g in sodium chloride 0.9 % 100 mL IVPB (0 g Intravenous Stopped 10/30/20 0453)  metroNIDAZOLE (FLAGYL) IVPB 500 mg (0 mg Intravenous Stopped 10/30/20 0406)  Tdap (BOOSTRIX) injection 0.5 mL (0.5 mLs Intramuscular Given 10/30/20 0402)    ED Course  I have reviewed the triage vital signs and the nursing notes.  Pertinent labs & imaging results that were available during my care of the patient were reviewed by me and considered in my medical decision making (see chart for details).    MDM Rules/Calculators/A&P                           Presents with initial complaints of shortness of breath and palpitations.  Patient denies recent illness or other concerns.  Given history, primary concern for COPD or CHF exacerbation however on exam patient found to be febrile, tachycardic, tachypneic.  Concern for sepsis.  Code sepsis initiated along with lab work and antibiotics.  Fluids held at this time as patient is hypertensive and has a history of CHF with decreased ejection fraction on Entresto.  On undressed physical exam patient found to have what appears to be an infected puncture wound to the right foot.  Patient reports this is been here for 4 days but he cannot recall the injury.  Patient also with cellulitic wounds to the right lower extremity.  Venous stasis of the left leg noted however right leg appears acutely infected.Marland Kitchen  4:58 AM Patient with leukocytosis and elevated lactic acid.  Repeat lactic has increased.  Will give 1 L of fluid.  Patient remains without hypoxia but is tachypneic with  some increased work of breathing.  Suspected source is patient's right foot and leg cellulitis.  He will need admission.   5:32 AM Discussed patient's case with hospitalist, Dr. Olevia Bowens.  I have recommended admission and patient (and family if present) agree with this plan. Admitting physician will place admission orders.    Final Clinical Impression(s) / ED Diagnoses Final diagnoses:  Sepsis with acute renal failure without septic shock, due to unspecified organism, unspecified acute renal failure type [facility])    Rx / [place] Orders ED Discharge Orders    None       Luxe Cuadros, Gwenlyn Perking 10/30/20 0533    Palumbo, April, MD 10/30/20 [phone]

## 2020-10-30 NOTE — ED Notes (Signed)
Troponin increased from 42 to 90. Dr. Olevia Bowens paged to notify.

## 2020-10-30 NOTE — Progress Notes (Signed)
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  Patient was admitted with SIRS/sepsis syndrome likely secondary to right lower extremity cellulitis and infected ulcers.  Initial vital signs were temperature 103.5 F, pulse 109, respirations 26, BP 165/88 mmHg O2 sat 97% on room air.  Initial lab work revealed glucosuria more than 500, proteinuria more than 300 and ketonuria of 5 mg/dL.  UDS was negative.  SARS coronavirus 2 was negative.  CBC showed a white count of 13.3, with 85% neutrophils, hemoglobin 13.1 g/dL platelets 201.  PT is 13.5, INR was 1.0 and PTT 24, Glucose 334, BUN 37 creatinine 2.07 mg/dL.  Total protein of 6.5 and albumin of 2.9 g/dL, lactic acid 2.9, initial troponin of 42 but rose to 90 ng/L.  Portable chest radiograph did not show any acute intra thoracic process.  Right foot x-rays show partial amputation of first digit.  There was no acute osseous abnormality.  Patient is currently on IV vancomycin, cefepime and Flagyl.  10/30/2020: Patient seen alongside patient's wife.  Patient seems to be improving.  Blood pressure is within normal range.  Tachycardia has resolved.  No shortness of breath.  Assessment & Plan:   Principal Problem:   Sepsis due to cellulitis [facility]) Active Problems:   Essential hypertension   GERD  (gastroesophageal reflux disease)   Mild persistent asthma   Mixed hyperlipidemia   Type 2 diabetes mellitus with stage 3b chronic kidney disease, with long-term current use of insulin (HCC)   Obesity, Class III, BMI 40-49.9 (morbid obesity) ([place])   Sleep apnea  Sepsis due to cellulitis POA ([place]) -Sepsis physiology has resolved. -Cellulitis seems to be improving. -Continue antibiotics. -Wound care input is appreciated. -Follow pending cultures.    Essential hypertension Continue amlodipine 5 mg p.o. daily. Continue carvedilol 25 mg p.o. daily. Continue Zestoretic 20/12.5 mg p.o. daily. Monitor BP, renal function electrolytes.  GERD (gastroesophageal reflux disease) Protonix 40 mg p.o. daily.  Mild persistent asthma Supplemental oxygen and bronchodilators as needed. Stable  Mixed hyperlipidemia Off statin.  History of NASH.  Type 2 diabetes mellitus with stage 3b chronic kidney disease, with long-term current use of insulin (HCC) Carbohydrate modified diet. Continue concentrated insulin 30 units with meals. Continue Jardiance 25 mg p.o. daily. CBG monitoring before meals and bedtime.  Obesity, Class III, BMI 40-49.9 (morbid obesity) (HCC) Lifestyle modifications. Follow-up with PCP.  Sleep apnea BiPAP at bedtime.  DVT prophylaxis: Xarelto Code Status: Full code Family Communication: Wife Disposition Plan: Home eventually   Consultants:   None  Procedures:   None  Antimicrobials:   IV vancomycin  IV cefepime.  IV Flagyl   Subjective: No new complaints. No fever or chills. No chest pain. Shortness of breath.  Objective: Vitals:   10/30/20 0734 10/30/20 0800 10/30/20 0815 10/30/20 0909  BP:  (!) 169/75 (!) 166/71 (!) 163/79  Pulse:  (!) 103 (!) 106 (!) 101  Resp:  (!) 25 (!) 26 16  Temp: (!) 101.2 F (38.4 C)   98.7 F (37.1 C)  TempSrc: Oral   Oral  SpO2:  96% 94% 98%  Weight:      Height:       No intake or output data in the  24 hours ending 10/30/20 1109 Filed Weights   10/30/20 0203  Weight: (!) 172.4 kg    Examination:  General exam: Appears calm and comfortable.  Patient is morbidly obese. Respiratory system: Clear to auscultation.  Cardiovascular system: S1 & S2 heard Gastrointestinal system: Abdomen is morbidly obese.  Organs are difficult to assess.   Central nervous system: Alert and oriented.  Patient moves all extremities.  Extremities: Chronic skin changes, with what appears to be improving cellulitis of right lower leg and ulcers.  Data Reviewed: I have personally reviewed following labs and imaging studies  CBC: Recent Labs  Lab 10/30/20 0226  WBC 13.3*  NEUTROABS 11.4*  HGB 13.1  HCT 38.6*  MCV 95.8  PLT 166   Basic Metabolic Panel: Recent Labs  Lab 10/30/20 0226  NA 138  K 3.5  CL 103  CO2 24  GLUCOSE 334*  BUN 37*  CREATININE 2.07*  CALCIUM 8.7*  MG 1.7   GFR: Estimated Creatinine Clearance: 68.2 mL/min (A) (by C-G formula based on SCr of 2.07 mg/dL (H)). Liver Function Tests: Recent Labs  Lab 10/30/20 0226  AST 24  ALT 24  ALKPHOS 52  BILITOT 0.8  PROT 6.5  ALBUMIN 2.9*   No results for input(s): LIPASE, AMYLASE in the last 168 hours. No results for input(s): AMMONIA in the last 168 hours. Coagulation Profile: Recent Labs  Lab 10/30/20 0226  INR 1.0   Cardiac Enzymes: No results for input(s): CKTOTAL, CKMB, CKMBINDEX, TROPONINI in the last 168 hours. BNP (last 3 results) No results for input(s): PROBNP in the last 8760 hours. HbA1C: No results for input(s): HGBA1C in the last 72 hours. CBG: No results for input(s): GLUCAP in the last 168 hours. Lipid Profile: No results for input(s): CHOL, HDL, LDLCALC, TRIG, CHOLHDL, LDLDIRECT in the last 72 hours. Thyroid Function Tests: No results for input(s): TSH, T4TOTAL, FREET4, T3FREE, THYROIDAB in the last 72 hours. Anemia Panel: No results for input(s): VITAMINB12, FOLATE, FERRITIN, TIBC, IRON,  RETICCTPCT in the last 72 hours. Urine analysis:    Component Value Date/Time   COLORURINE STRAW (A) 10/30/2020 0215   APPEARANCEUR CLEAR 10/30/2020 0215   LABSPEC 1.015 10/30/2020 0215   PHURINE 6.0 10/30/2020 0215   GLUCOSEU >=500 (A) 10/30/2020 0215   HGBUR SMALL (A) 10/30/2020 0215   BILIRUBINUR NEGATIVE 10/30/2020 0215   KETONESUR 5 (A) 10/30/2020 0215   PROTEINUR >=300 (A) 10/30/2020 0215   NITRITE NEGATIVE 10/30/2020 0215   LEUKOCYTESUR NEGATIVE 10/30/2020 0215   Sepsis Labs: @LABRCNTIP (procalcitonin:4,lacticidven:4)  ) Recent Results (from the past 240 hour(s))  Resp Panel by RT-PCR (Flu A&B, Covid) Nasopharyngeal Swab     Status: None   Collection Time: 10/30/20  3:57 AM   Specimen: Nasopharyngeal Swab; Nasopharyngeal(NP) swabs in vial transport medium  Result Value Ref Range Status   SARS Coronavirus 2 by RT PCR NEGATIVE NEGATIVE Final    Comment: (NOTE) SARS-CoV-2 target nucleic acids are NOT DETECTED.  The SARS-CoV-2 RNA is generally detectable in upper respiratory specimens during the acute phase of infection. The lowest concentration of SARS-CoV-2 viral copies this assay can detect is 138 copies/mL. A negative result does not preclude SARS-Cov-2 infection and should not be used as the sole basis for  treatment or other patient management decisions. A negative result may occur with  improper specimen collection/handling, submission of specimen other than nasopharyngeal swab, presence of viral mutation(s) within the areas targeted by this assay, and inadequate number of viral copies(<138 copies/mL). A negative result must be combined with clinical observations, patient history, and epidemiological information. The expected result is Negative.  Fact Sheet for Patients:  EntrepreneurPulse.com.au  Fact Sheet for Healthcare Providers:  IncredibleEmployment.be  This test is no t yet approved or cleared by the [place] FDA  and  has been authorized for detection and/or diagnosis of SARS-CoV-2 by FDA under an Emergency Use Authorization (EUA). This EUA will remain  in effect (meaning this test can be used) for the duration of the COVID-19 declaration under Section 564(b)(1) of the Act, 21 U.S.C.section 360bbb-3(b)(1), unless the authorization is terminated  or revoked sooner.       Influenza A by PCR NEGATIVE NEGATIVE Final   Influenza B by PCR NEGATIVE NEGATIVE Final    Comment: (NOTE) The Xpert Xpress SARS-CoV-2/FLU/RSV plus assay is intended as an aid in the diagnosis of influenza from Nasopharyngeal swab specimens and should not be used as a sole basis for treatment. Nasal washings and aspirates are unacceptable for Xpert Xpress SARS-CoV-2/FLU/RSV testing.  Fact Sheet for Patients: EntrepreneurPulse.com.au  Fact Sheet for Healthcare Providers: IncredibleEmployment.be  This test is not yet approved or cleared by the [place] FDA and has been authorized for detection and/or diagnosis of SARS-CoV-2 by FDA under an Emergency Use Authorization (EUA). This EUA will remain in effect (meaning this test can be used) for the duration of the COVID-19 declaration under Section 564(b)(1) of the Act, 21 U.S.C. section 360bbb-3(b)(1), unless the authorization is terminated or revoked.  Performed at [facility], [place] [address] [place], [place] [address]          Radiology Studies: DG Chest Portable 1 View  Result Date: 10/30/2020 CLINICAL DATA:  Chest pain, tachycardia, diaphoresis EXAM: PORTABLE CHEST 1 VIEW COMPARISON:  04/27/2018 FINDINGS: Two frontal views of the chest demonstrate an unremarkable cardiac silhouette. No acute airspace disease, effusion, or pneumothorax. Stable bilateral calcified granuloma. No acute bony abnormalities. IMPRESSION: 1. No acute intrathoracic process. Electronically Signed   By: Randa Ngo M.D.   On: 10/30/2020 03:01    DG Foot Complete Right  Result Date: 10/30/2020 CLINICAL DATA:  Pain, puncture wound EXAM: RIGHT FOOT COMPLETE - 3+ VIEW COMPARISON:  None. FINDINGS: No fracture or malalignment. Status post amputation of first digit at the level of the IP joint. Moderate degenerative change at the first MTP joint. Tiny focus of probable gas within the mid sole of the foot presumably due to puncture wound. No periostitis or bony destructive change. Dorsal degenerative changes of the midfoot IMPRESSION: Partial amputation of first digit.  No acute osseous abnormality. Electronically Signed   By: Donavan Foil M.D.   On: 10/30/2020 03:57        Scheduled Meds: . carvedilol  25 mg Oral BID WC  . empagliflozin  25 mg Oral Daily  . lisinopril  20 mg Oral Daily   And  . hydrochlorothiazide  12.5 mg Oral Daily  . insulin regular human CONCENTRATED  30 Units Subcutaneous TID WC  . isosorbide mononitrate  30 mg Oral Daily  . rivaroxaban  20 mg Oral Q supper  . Vitamin D (Ergocalciferol)  50,000 Units Oral Q Mon   Continuous Infusions: . ceFEPime (MAXIPIME) IV    . lactated ringers  150 mL/hr at 10/30/20 0239  . metronidazole 500 mg (10/30/20 1044)  . [START ON 10/31/2020] vancomycin       LOS: 0 days    Time spent: 35 minutes    Dana Allan, MD  [facility] Pager #: [phone] 7PM-7AM contact night coverage as above

## 2020-10-30 NOTE — Consult Note (Addendum)
[place] Nurse Consult Note: Patient receiving care in [facility] [phone] Reason for Consult: wound care Wound type: Per Care Everywhere notes from 08/30/20, the patient was seen and treated for an abscess to the right leg at [facility].  The patient is a diabetic. Pressure Injury POA: Yes/No/NA Measurement: To be provided by the bedside RN in the flowsheet section Wound bed: see photos Drainage (amount, consistency, odor)  Periwound: RLE is edematous, erythematous Dressing procedure/placement/frequency: Twice daily treatment as follows: Cleanse RLE and Right foot wounds with soap and water, pat dry. Apply Bactroban, cover with dry gauze, secure with kerlex.  Monitor the wound area(s) for worsening of condition such as: Signs/symptoms of infection,  Increase in size,  Development of or worsening of odor, Development of pain, or increased pain at the affected locations.  Notify the medical team if any of these develop.  Thank you for the consult.[place] nurse will not follow at this time.  Please re-consult the [place] team if needed.  Val Riles, RN, MSN, CWOCN, CNS-BC, pager [phone]

## 2020-10-30 NOTE — Progress Notes (Signed)
Patient admitted to [facility] from ED. Patient is alert and oriented x4. Vital signs are stable and he is on 3L of oxygen. Has no complaints of pain. Skin is intact and the patient does have cellulitis of R lower leg and has 2 open sores that have some discharge, a ulcer/sore on the bottom of the R foot, R great toe was partially amputated and does not have a toe nail. Both lower extremities are discolored as well. Patient belongings at bedside (clothing). The patient was shown how to use the call bell. Call bell, phone and bedside table are within reach; bed is in the lowest position.

## 2020-10-30 NOTE — H&P (Signed)
History and Physical    Sean Valentine BHA:[id number] DOB: [birth year] DOA: 10/30/2020  PCP: Raina Mina., MD   Patient coming from: Home.   I have personally briefly reviewed patient's old medical records in [place]  Chief Complaint: "Felt like heart was racing."  HPI: Sean Valentine is a 55 y.o. 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  He went to the bathroom and had multiple episodes of emesis.  He also had 2 episodes of emesis in the emergency department.  EMS noticed that he was diaphoretic and anxious.  He denied fever although he was found to be febrile in the emergency department upon arrival.  He denies headache, chest pain, abdominal pain, diarrhea, constipation, melena or hematochezia.  No dysuria, frequency or hematuria.  He denies polyuria, polydipsia, polyphagia or blurred vision.  ED Course: Initial vital signs were temperature 103.5 F, pulse 109, respirations 26, BP 165/88 mmHg O2 sat 97% on room air.  Lab work: His urinalysis showed glucosuria more than 500, proteinuria more than 300 and ketonuria of 5 mg/dL.  UDS was negative.  SARS coronavirus 2 was negative.  CBC showed a white count of 13.3, with 85% neutrophils, hemoglobin 13.1 g/dL platelets 201.  PT is 13.5, INR was 1.0 and PTT 24.  CMP showed normal  electrolytes when calcium was corrected to albumin level.  Glucose 334, BUN 37 creatinine 2.07 mg/dL.  Total protein wound was 2.9 g/dL, the rest of the LFTs were normal.  Lactic acid 2.9 and then 3.9 mmol/L.  Troponin level was 42 and then 90 ng/L.  Imaging: One-view portable chest radiograph did not show any acute intra thoracic process.  Right foot x-rays show partial amputation of first digit.  There was no acute osseous abnormality.  Please see images and full radiology report for further detail.  Review of Systems: As per HPI otherwise all other systems reviewed and are negative.  Past Medical History:  Diagnosis Date  . Anemia, chronic disease 05/26/2018   Formatting of this note might be different from the original. Eval with [facility] and phlebotomy for Hemochromotosis.  . Anticoagulant long-term use 11/26/2016  . Arthritis of right foot 05/08/2016  . Benign hypertension with CKD (chronic kidney disease) stage III ([place]) 07/12/2019  . Bilateral leg edema   . Chest pain at rest 08/16/2020  . CHF (congestive heart failure) ([place]) 03/05/2012  . Deformity of toe 08/01/2020  . Diabetic ulcer of toe of right foot associated with type 2 diabetes mellitus, with fat layer exposed ([place]) 01/10/2020   Formatting of this note might be different from the original. Distal tuft right fifth toe  . Dyslipidemia 03/05/2012  . Essential hypertension 11/26/2016  . GERD (gastroesophageal reflux disease) 08/01/2020  . Gouty arthritis of left hand 01/15/2017  . High risk medication use 11/26/2016  . History of COVID-19 05/05/2020   Formatting of this note might be different from the original. 02/2020  . History of pulmonary  embolism 11/26/2016   Formatting of this note might be different from the original. Felt severe with permanent anticoagulation and Filter placed because of burden.  . Hypertension   . Localized edema 03/05/2012  . LVH (left ventricular hypertrophy) 05/26/2018   Formatting of this note might be different  from the original. 04/2018 EF 555-60%  . Malaise and fatigue 11/26/2016  . MGUS (monoclonal gammopathy of unknown significance) 06/03/2018   Formatting of this note might be different from the original. Possible. Seen on SPEP 05/2018  . Mild persistent asthma 11/26/2016  . Mixed hyperlipidemia 11/26/2016  . Morbid (severe) obesity due to excess calories ([place]) 03/05/2012  . Morbid obesity with body mass index (BMI) of 40.0 to 49.9 ([place]) 11/26/2016  . Nail dystrophy 05/20/2018  . Nephrolithiasis 03/05/2012  . Nonalcoholic steatohepatitis ([facility]) 05/26/2018  . Other hemochromatosis 03/21/2020  . Posterior tibial tendinitis of right lower extremity 08/01/2020  . Pre-ulcerative calluses 06/18/2019  . Primary central sleep apnea 11/26/2016   Formatting of this note might be different from the original. bipap  . Sleep apnea 03/05/2012   Formatting of this note might be different from the original. [place].  Marland Kitchen Spinal stenosis of lumbar region 05/26/2018  . Status post amputation of toe of right foot ([place]) 07/26/2015  . Subfibular impingement, right 03/13/2016  . Tinea pedis 08/01/2020  . Type 2 diabetes mellitus with stage 3b chronic kidney disease, with long-term current use of insulin ([place]) 07/12/2019   Formatting of this note might be different from the original. IMO 10/01 Updates  Formatting of this note might be different from the original. 2000   Past Surgical History:  Procedure Laterality Date  . CHOLECYSTECTOMY    . CYSTOSCOPY W/ URETERAL STENT PLACEMENT Left 05/06/2005  . FOOT SURGERY Right    Big toe amputation  . IVC FILTER INSERTION  08/2016  . KIDNEY STONE SURGERY    . SHOULDER SURGERY Left    Social History  reports that he quit smoking about 36 years ago. He quit after 1.00 year of use. He has never used smokeless tobacco. No history on file for alcohol use and drug use.  No Known Allergies  Family History  Problem Relation Age of Onset  . Cancer Mother   . Clotting disorder Mother    . Stroke Mother   . Alzheimer's disease Mother   . Diabetes Mother   . Diabetes Father    Prior to Admission medications   Medication Sig Start Date End Date Taking? Authorizing Provider  amLODipine (NORVASC) 5 MG tablet Take 5 mg by mouth daily as needed (high blood pressure). 09/07/17  Yes [provider]  carvedilol (COREG) 25 MG tablet Take 25 mg by mouth 2 (two) times daily with a meal. 07/12/19  Yes [provider]  empagliflozin (JARDIANCE) 25 MG TABS tablet Take 25 mg by mouth daily. 03/24/19  Yes [provider]  ergocalciferol (VITAMIN D2) 1.25 MG (50000 UT) capsule Take 1 capsule by mouth every Monday. 07/13/20  Yes [provider]  insulin aspart (FIASP FLEXTOUCH) 100 UNIT/ML FlexTouch Pen Inject 20 Units into the skin 3 (three) times daily. Inject 20 units into the skin before each meal 03/21/20  Yes [provider]  insulin regular human CONCENTRATED (HUMULIN R) 500 UNIT/ML injection Inject 30 Units into the skin 3 (three) times daily with meals. 11/29/17  Yes [provider]  isosorbide mononitrate (IMDUR) 30 MG 24 hr tablet Take 1 tablet (30 mg total) by mouth daily. 09/13/20  Yes Park Liter, MD  lisinopril-hydrochlorothiazide (ZESTORETIC) 20-12.5 MG tablet Take 1 tablet by mouth daily. 03/13/17  Yes [provider]  nitroGLYCERIN (NITROSTAT) 0.4 MG SL tablet Place 1 tablet (0.4 mg total) under the tongue every 5 (five) minutes as needed for chest pain. 09/13/20 12/12/20 Yes Park Liter, MD  rivaroxaban (XARELTO) 20 MG TABS tablet Take 20 mg by mouth daily with supper.   Yes [provider]  torsemide (DEMADEX) 20 MG tablet Take 20 mg by mouth daily as needed (for swelling). 06/01/20  Yes [provider]  vitamin B-12 (CYANOCOBALAMIN) 1000 MCG tablet Take 1,000 mcg by mouth every Monday.   Yes [provider]  ciprofloxacin (CIPRO) 500 MG tablet Take 500 mg by mouth 2 (two) times  daily. Patient not taking: No sig reported    [provider]   Physical Exam: Vitals:   10/30/20 0530 10/30/20 0545 10/30/20 0600 10/30/20 0630  BP: (!) 172/74 (!) 175/70 (!) 178/73 (!) 171/76  Pulse: (!) 111 (!) 110 (!) 109 (!) 110  Resp: 14 (!) 26 19 18   Temp:      TempSrc:      SpO2: 91% 97% 98% 98%  Weight:      Height:       Constitutional: Looks acutely ill. Eyes: PERRL, lids and conjunctivae normal.  Injected sclera ENMT: Mucous membranes and lips are very dry.  Posterior pharynx clear of any exudate or lesions. Neck: normal, supple, no masses, no thyromegaly Respiratory: clear to auscultation bilaterally, no wheezing, no crackles. Normal respiratory effort. No accessory muscle use.  Cardiovascular: Tachycardic and 110s, no murmurs / rubs / gallops. No lower extremity pitting edema. 2+ pedal pulses. No carotid bruits.  Abdomen: Obese, no distention.  Bowel sounds positive.  Soft, no tenderness, no masses palpated. No hepatosplenomegaly.  Musculoskeletal: Mild generalized weakness.  Partially amputated right great toe.  No clubbing / cyanosis. Good ROM, no contractures. Normal muscle tone.  Skin: Stage III lymphedema with bilateral venous stasis dermatitis with multiple lower pretibial and 1 plantar wounds on RLE with surrounding edema, erythema, calor and TTP.  Please see pictures below. Neurologic: CN 2-12 grossly intact. Sensation intact, DTR normal. Strength 5/5 in all 4.  Psychiatric: Normal judgment and insight. Alert and oriented x 3. Normal mood.             Labs on Admission: I have personally reviewed following labs and imaging studies  CBC: Recent Labs  Lab 10/30/20 0226  WBC 13.3*  NEUTROABS 11.4*  HGB 13.1  HCT 38.6*  MCV 95.8  PLT 010    Basic Metabolic Panel: Recent Labs  Lab 10/30/20 0226  NA 138  K 3.5  CL 103  CO2 24  GLUCOSE 334*  BUN 37*  CREATININE 2.07*  CALCIUM 8.7*  MG 1.7   GFR: Estimated Creatinine Clearance:  68.2 mL/min (A) (by C-G formula based on SCr of 2.07 mg/dL (H)).  Liver Function Tests: Recent Labs  Lab 10/30/20 0226  AST 24  ALT 24  ALKPHOS 52  BILITOT 0.8  PROT 6.5  ALBUMIN 2.9*   Urine analysis:    Component Value Date/Time   COLORURINE STRAW (A) 10/30/2020 0215   APPEARANCEUR CLEAR 10/30/2020 0215   LABSPEC 1.015 10/30/2020 0215   PHURINE 6.0 10/30/2020 0215   GLUCOSEU >=500 (A) 10/30/2020 0215   HGBUR SMALL (A) 10/30/2020 0215   BILIRUBINUR NEGATIVE 10/30/2020 0215   KETONESUR 5 (A) 10/30/2020 0215   PROTEINUR >=300 (A) 10/30/2020  0215   NITRITE NEGATIVE 10/30/2020 0215   LEUKOCYTESUR NEGATIVE 10/30/2020 0215   Radiological Exams on Admission: DG Chest Portable 1 View  Result Date: 10/30/2020 CLINICAL DATA:  Chest pain, tachycardia, diaphoresis EXAM: PORTABLE CHEST 1 VIEW COMPARISON:  04/27/2018 FINDINGS: Two frontal views of the chest demonstrate an unremarkable cardiac silhouette. No acute airspace disease, effusion, or pneumothorax. Stable bilateral calcified granuloma. No acute bony abnormalities. IMPRESSION: 1. No acute intrathoracic process. Electronically Signed   By: Randa Ngo M.D.   On: 10/30/2020 03:01   DG Foot Complete Right  Result Date: 10/30/2020 CLINICAL DATA:  Pain, puncture wound EXAM: RIGHT FOOT COMPLETE - 3+ VIEW COMPARISON:  None. FINDINGS: No fracture or malalignment. Status post amputation of first digit at the level of the IP joint. Moderate degenerative change at the first MTP joint. Tiny focus of probable gas within the mid sole of the foot presumably due to puncture wound. No periostitis or bony destructive change. Dorsal degenerative changes of the midfoot IMPRESSION: Partial amputation of first digit.  No acute osseous abnormality. Electronically Signed   By: Donavan Foil M.D.   On: 10/30/2020 03:57    EKG: Independently reviewed.  Vent. rate 110 BPM PR interval 171 ms QRS duration 116 ms QT/QTcB 352/477 ms P-R-T axes 83 -68  100 Sinus tachycardia LVH with IVCD, LAD and secondary repol abnrm  Assessment/Plan Principal problem:   Sepsis due to cellulitis POA (HCC) Observation/telemetry. Continue cefepime per pharmacy. Continue metronidazole 500 mg IVPB. Continue vancomycin per pharmacy. Consult wound care. Follow-up blood culture and sensitivity.  Active Problems:   Essential hypertension Continue amlodipine 5 mg p.o. daily. Continue carvedilol 25 mg p.o. daily. Continue Zestoretic 20/12.5 mg p.o. daily. Monitor BP, renal function electrolytes.    GERD (gastroesophageal reflux disease) Protonix 40 mg p.o. daily.    Mild persistent asthma Supplemental oxygen and bronchodilators as needed.    Mixed hyperlipidemia Off statin.  History of NASH.    Type 2 diabetes mellitus with stage 3b chronic kidney disease, with long-term current use of insulin (HCC) Carbohydrate modified diet. Continue concentrated insulin 30 units with meals. Continue Jardiance 25 mg p.o. daily. CBG monitoring before meals and bedtime.    Obesity, Class III, BMI 40-49.9 (morbid obesity) (HCC) Lifestyle modifications. Follow-up with PCP.    Sleep apnea BiPAP at bedtime.    DVT prophylaxis: On rivaroxaban. Code Status:   Full code Family Communication:  His spouse was present in the ED. Disposition Plan:   Patient is from:  Home.  Anticipated DC to:  Home.  Anticipated DC date:  11/01/2020.  Anticipated DC barriers: Clinical status.  Consults called:   Admission status:  Observation/telemetry.   Severity of Illness:High severity in the setting of sepsis due to cellulitis (present on admission).  The patient will need to remain for IV antibiotic therapy for at least 48 to 72 hours.  Reubin Milan MD [facility]  How to contact the [facility] Attending or Consulting provider [place] or covering provider during after hours [place], for this patient?   1. Check the care team in [facility] and look for a)  attending/consulting [facility] provider listed and b) the [facility] team listed 2. Log into [email or web address] and use [place]'s universal password to access. If you do not have the password, please contact the hospital operator. 3. Locate the [facility] provider you are looking for under [facility] and page to a number that you can be directly reached. 4. If you still have  difficulty reaching the provider, please page the [facility] (Director on Call) for the Hospitalists listed on amion for assistance.  10/30/2020, 6:46 AM   This document was prepared using Dragon voice recognition software and may contain some unintended transcription errors.

## 2020-10-30 NOTE — Progress Notes (Signed)
Pharmacy Antibiotic Note  Sean Valentine is a 55 y.o. male admitted on 10/30/2020 with cellulitis.  Pharmacy has been consulted for vancomycin and cefepime dosing.  Plan: Vancomycin 2500mg  x1 then 2000mg  IV Q24H. Goal AUC 400-550.  Expected AUC 510.  SCr 2.07.  Cefepime 2g IV Q8H.  Height: 6\' 5"  (195.6 cm) Weight: (!) 172.4 kg (380 lb) IBW/kg (Calculated) : 89.1  Temp (24hrs), Avg:103.2 F (39.6 C), Min:102.9 F (39.4 C), Max:103.5 F (39.7 C)  Recent Labs  Lab 10/30/20 0226 10/30/20 0241 10/30/20 0317  WBC 13.3*  --   --   CREATININE 2.07*  --   --   LATICACIDVEN  --  2.9* 3.9*    Estimated Creatinine Clearance: 68.2 mL/min (A) (by C-G formula based on SCr of 2.07 mg/dL (H)).    No Known Allergies   Thank you for allowing pharmacy to be a part of this patient's care.  Wynona Neat, PharmD, BCPS  10/30/2020 6:25 AM

## 2020-10-30 NOTE — ED Triage Notes (Signed)
Woke up feeling like heart was racing, No pain associated with it. Upon EMS arrival, patient was diaphoretic and anxious. Patient reports 5 episodes of vomiting prior to EMS arrival. [place] per EMS. 130 bpm average.

## 2020-10-30 NOTE — ED Notes (Signed)
Patient transported to x-ray. ?

## 2020-10-30 NOTE — Progress Notes (Signed)
PHARMACY - PHYSICIAN COMMUNICATION CRITICAL VALUE ALERT - BLOOD CULTURE IDENTIFICATION (BCID)  Sean Valentine is an 55 y.o. male who presented to [facility] on 10/30/2020 with a chief complaint of palpitations.   Assessment:  55 year old male admitted with palpitations, episodes of emesis in the ED and found to be febrile. RLE wounds present and likely source of bacteremia.   Name of physician (or Provider) ContactedMarthenia Rolling MD  Current antibiotics: vancomycin, cefepime, flagyl  Changes to prescribed antibiotics recommended:  Recommendations accepted by provider  Narrow antibiotics to pcg G 17mu q4 hours  Results for orders placed or performed during the hospital encounter of 10/30/20  Blood Culture ID Panel (Reflexed) (Collected: 10/30/2020  2:57 AM)  Result Value Ref Range   Enterococcus faecalis NOT DETECTED NOT DETECTED   Enterococcus Faecium NOT DETECTED NOT DETECTED   Listeria monocytogenes NOT DETECTED NOT DETECTED   Staphylococcus species NOT DETECTED NOT DETECTED   Staphylococcus aureus (BCID) NOT DETECTED NOT DETECTED   Staphylococcus epidermidis NOT DETECTED NOT DETECTED   Staphylococcus lugdunensis NOT DETECTED NOT DETECTED   Streptococcus species DETECTED (A) NOT DETECTED   Streptococcus agalactiae DETECTED (A) NOT DETECTED   Streptococcus pneumoniae NOT DETECTED NOT DETECTED   Streptococcus pyogenes NOT DETECTED NOT DETECTED   A.calcoaceticus-baumannii NOT DETECTED NOT DETECTED   Bacteroides fragilis NOT DETECTED NOT DETECTED   Enterobacterales NOT DETECTED NOT DETECTED   Enterobacter cloacae complex NOT DETECTED NOT DETECTED   Escherichia coli NOT DETECTED NOT DETECTED   Klebsiella aerogenes NOT DETECTED NOT DETECTED   Klebsiella oxytoca NOT DETECTED NOT DETECTED   Klebsiella pneumoniae NOT DETECTED NOT DETECTED   Proteus species NOT DETECTED NOT DETECTED   Salmonella species NOT DETECTED NOT DETECTED   Serratia marcescens NOT DETECTED NOT DETECTED   Haemophilus  influenzae NOT DETECTED NOT DETECTED   Neisseria meningitidis NOT DETECTED NOT DETECTED   Pseudomonas aeruginosa NOT DETECTED NOT DETECTED   Stenotrophomonas maltophilia NOT DETECTED NOT DETECTED   Candida albicans NOT DETECTED NOT DETECTED   Candida auris NOT DETECTED NOT DETECTED   Candida glabrata NOT DETECTED NOT DETECTED   Candida krusei NOT DETECTED NOT DETECTED   Candida parapsilosis NOT DETECTED NOT DETECTED   Candida tropicalis NOT DETECTED NOT DETECTED   Cryptococcus neoformans/gattii NOT DETECTED NOT DETECTED   Erin Hearing PharmD., BCPS Clinical Pharmacist 10/30/2020 5:21 PM

## 2020-10-30 NOTE — Progress Notes (Signed)
[place] following for Sepsis Protocol 

## 2020-10-31 ENCOUNTER — Inpatient Hospital Stay (HOSPITAL_COMMUNITY): Payer: PRIVATE HEALTH INSURANCE

## 2020-10-31 DIAGNOSIS — L0390 Cellulitis, unspecified: Secondary | ICD-10-CM | POA: Diagnosis not present

## 2020-10-31 DIAGNOSIS — A419 Sepsis, unspecified organism: Secondary | ICD-10-CM | POA: Diagnosis not present

## 2020-10-31 LAB — COMPREHENSIVE METABOLIC PANEL
ALT: 19 U/L (ref 0–44)
AST: 15 U/L (ref 15–41)
Albumin: 2.5 g/dL — ABNORMAL LOW (ref 3.5–5.0)
Alkaline Phosphatase: 43 U/L (ref 38–126)
Anion gap: 8 (ref 5–15)
BUN: 28 mg/dL — ABNORMAL HIGH (ref 6–20)
CO2: 25 mmol/L (ref 22–32)
Calcium: 8.3 mg/dL — ABNORMAL LOW (ref 8.9–10.3)
Chloride: 101 mmol/L (ref 98–111)
Creatinine, Ser: 2.19 mg/dL — ABNORMAL HIGH (ref 0.61–1.24)
GFR, Estimated: 34 mL/min — ABNORMAL LOW (ref >60)
Glucose, Bld: 277 mg/dL — ABNORMAL HIGH (ref 70–99)
Potassium: 3.3 mmol/L — ABNORMAL LOW (ref 3.5–5.1)
Sodium: 134 mmol/L — ABNORMAL LOW (ref 135–145)
Total Bilirubin: 0.9 mg/dL (ref 0.3–1.2)
Total Protein: 5.8 g/dL — ABNORMAL LOW (ref 6.5–8.1)

## 2020-10-31 LAB — CBC WITH DIFFERENTIAL/PLATELET
Abs Immature Granulocytes: 0.05 10*3/uL (ref 0.00–0.07)
Basophils Absolute: 0.1 10*3/uL (ref 0.0–0.1)
Basophils Relative: 1 %
Eosinophils Absolute: 0.1 10*3/uL (ref 0.0–0.5)
Eosinophils Relative: 1 %
HCT: 34.3 % — ABNORMAL LOW (ref 39.0–52.0)
Hemoglobin: 11.6 g/dL — ABNORMAL LOW (ref 13.0–17.0)
Immature Granulocytes: 1 %
Lymphocytes Relative: 10 %
Lymphs Abs: 1 10*3/uL (ref 0.7–4.0)
MCH: 32.3 pg (ref 26.0–34.0)
MCHC: 33.8 g/dL (ref 30.0–36.0)
MCV: 95.5 fL (ref 80.0–100.0)
Monocytes Absolute: 0.8 10*3/uL (ref 0.1–1.0)
Monocytes Relative: 7 %
Neutro Abs: 8.4 10*3/uL — ABNORMAL HIGH (ref 1.7–7.7)
Neutrophils Relative %: 80 %
Platelets: 182 10*3/uL (ref 150–400)
RBC: 3.59 MIL/uL — ABNORMAL LOW (ref 4.22–5.81)
RDW: 13.7 % (ref 11.5–15.5)
WBC: 10.3 10*3/uL (ref 4.0–10.5)
nRBC: 0 % (ref 0.0–0.2)

## 2020-10-31 LAB — HIV ANTIBODY (ROUTINE TESTING W REFLEX): HIV Screen 4th Generation wRfx: NONREACTIVE

## 2020-10-31 MED ORDER — HYDROCOD POLST-CPM POLST ER 10-8 MG/5ML PO SUER
5.0000 mL | Freq: Two times a day (BID) | ORAL | Status: DC | PRN
  Administered 2020-10-31: 5 mL via ORAL
  Filled 2020-10-31: qty 5

## 2020-10-31 MED ORDER — FUROSEMIDE 10 MG/ML IJ SOLN
40.0000 mg | INTRAMUSCULAR | Status: AC
  Administered 2020-10-31: 40 mg via INTRAVENOUS
  Filled 2020-10-31: qty 4

## 2020-10-31 MED ORDER — POTASSIUM CHLORIDE CRYS ER 20 MEQ PO TBCR
20.0000 meq | EXTENDED_RELEASE_TABLET | Freq: Once | ORAL | Status: AC
  Administered 2020-10-31: 20 meq via ORAL
  Filled 2020-10-31: qty 1

## 2020-10-31 MED ORDER — IPRATROPIUM-ALBUTEROL 0.5-2.5 (3) MG/3ML IN SOLN
3.0000 mL | Freq: Two times a day (BID) | RESPIRATORY_TRACT | Status: DC
  Administered 2020-10-31: 3 mL via RESPIRATORY_TRACT
  Filled 2020-10-31: qty 3

## 2020-10-31 MED ORDER — IPRATROPIUM-ALBUTEROL 0.5-2.5 (3) MG/3ML IN SOLN
3.0000 mL | Freq: Four times a day (QID) | RESPIRATORY_TRACT | Status: DC
  Administered 2020-10-31 (×2): 3 mL via RESPIRATORY_TRACT
  Filled 2020-10-31 (×2): qty 3

## 2020-10-31 MED ORDER — GUAIFENESIN ER 600 MG PO TB12
600.0000 mg | ORAL_TABLET | Freq: Two times a day (BID) | ORAL | Status: DC
  Administered 2020-10-31 – 2020-11-04 (×9): 600 mg via ORAL
  Filled 2020-10-31 (×9): qty 1

## 2020-10-31 MED ORDER — PHENOL 1.4 % MT LIQD
1.0000 | OROMUCOSAL | Status: DC | PRN
  Administered 2020-10-31: 1 via OROMUCOSAL
  Filled 2020-10-31: qty 177

## 2020-10-31 MED ORDER — IPRATROPIUM-ALBUTEROL 0.5-2.5 (3) MG/3ML IN SOLN
3.0000 mL | RESPIRATORY_TRACT | Status: DC | PRN
  Administered 2020-10-31: 3 mL via RESPIRATORY_TRACT
  Filled 2020-10-31: qty 3

## 2020-10-31 MED ORDER — LOPERAMIDE HCL 2 MG PO CAPS
2.0000 mg | ORAL_CAPSULE | ORAL | Status: DC | PRN
  Administered 2020-10-31: 2 mg via ORAL
  Filled 2020-10-31: qty 1

## 2020-10-31 NOTE — Plan of Care (Signed)
  Problem: Clinical Measurements: Goal: Ability to maintain clinical measurements within normal limits will improve Outcome: Progressing Goal: Will remain free from infection Outcome: Progressing Goal: Respiratory complications will improve Outcome: Progressing Goal: Cardiovascular complication will be avoided Outcome: Progressing   Problem: Activity: Goal: Risk for activity intolerance will decrease Outcome: Progressing   Problem: Coping: Goal: Level of anxiety will decrease Outcome: Progressing   Problem: Elimination: Goal: Will not experience complications related to bowel motility Outcome: Progressing Goal: Will not experience complications related to urinary retention Outcome: Progressing

## 2020-10-31 NOTE — Progress Notes (Signed)
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  Patient was admitted with SIRS/sepsis syndrome likely secondary to right lower extremity cellulitis and infected ulcers.  Initial vital signs were temperature 103.5 F, pulse 109, respirations 26, BP 165/88 mmHg O2 sat 97% on room air.  Initial lab work revealed glucosuria more than 500, proteinuria more than 300 and ketonuria of 5 mg/dL.  UDS was negative.  SARS coronavirus 2 was negative.  CBC showed a white count of 13.3, with 85% neutrophils, hemoglobin 13.1 g/dL platelets 201.  PT is 13.5, INR was 1.0 and PTT 24, Glucose 334, BUN 37 creatinine 2.07 mg/dL.  Total protein of 6.5 and albumin of 2.9 g/dL, lactic acid 2.9, initial troponin of 42 but rose to 90 ng/L.  Portable chest radiograph did not show any acute intra thoracic process.  Right foot x-rays show partial amputation of first digit.  There was no acute osseous abnormality.  Patient is currently on IV vancomycin, cefepime and Flagyl.  10/30/2020: Patient seen alongside patient's wife.  Patient seems to be improving.  Blood pressure is within normal range.  Tachycardia has resolved.  No shortness of breath.  10/31/2020: Blood culture grew group B strep.  Penicillin G 4,000,000 units every 4 hours.  We will repeat blood cultures.  Have a low  threshold to consult infectious disease.  Assessment & Plan:   Principal Problem:   Sepsis due to cellulitis [facility]) Active Problems:   Essential hypertension   GERD (gastroesophageal reflux disease)   Mild persistent asthma   Mixed hyperlipidemia   Type 2 diabetes mellitus with stage 3b chronic kidney disease, with long-term current use of insulin (HCC)   Obesity, Class III, BMI 40-49.9 (morbid obesity) ([place])   Sleep apnea   Sepsis ([place])  Sepsis due to cellulitis POA ([place]) -Sepsis physiology has resolved. -Cellulitis seems to be improving. -Blood culture grew group B strep. -Patient is currently on IV penicillin G. -Repeat blood cultures. -Low threshold to consult infectious disease.  Essential hypertension Continue amlodipine 5 mg p.o. daily. Continue carvedilol 25 mg p.o. daily. Continue Zestoretic 20/12.5 mg p.o. daily. Monitor BP, renal function electrolytes.  GERD (gastroesophageal reflux disease) Protonix 40 mg p.o. daily.  Mild persistent asthma Supplemental oxygen and bronchodilators as needed. Stable  Mixed hyperlipidemia Off statin.  History of NASH.  Type 2 diabetes mellitus with stage 3b chronic kidney disease, with long-term current use of insulin (HCC) Carbohydrate modified diet. Continue concentrated insulin 30 units with meals. Continue Jardiance 25 mg p.o. daily. CBG monitoring before meals and bedtime.  Obesity, Class III, BMI 40-49.9 (morbid obesity) (HCC) Lifestyle modifications. Follow-up with PCP.  Sleep apnea BiPAP at bedtime.  DVT prophylaxis: Xarelto Code Status: Full code Family Communication: Wife Disposition Plan: Home eventually   Consultants:   None  Procedures:   None  Antimicrobials:   IV vancomycin discontinued.   IV cefepime discontinued.  IV Flagyl discontinued.  IV penicillin G 4,000,000 units every  4 hours.   Subjective: No fever or chills. No chest pain. Shortness of  breath.  Objective: Vitals:   10/31/20 0845 10/31/20 0948 10/31/20 1129 10/31/20 1437  BP:  138/66  131/70  Pulse:    73  Resp:    16  Temp:    98 F (36.7 C)  TempSrc:    Oral  SpO2: 96%  96% 93%  Weight:      Height:        Intake/Output Summary (Last 24 hours) at 10/31/2020 1956 Last data filed at 10/31/2020 0730 Gross per 24 hour  Intake 2402 ml  Output 1175 ml  Net 1227 ml   Filed Weights   10/30/20 0203  Weight: (!) 172.4 kg    Examination:  General exam: Appears calm and comfortable.  Patient is morbidly obese. Respiratory system: Clear to auscultation.  Cardiovascular system: S1 & S2 heard Gastrointestinal system: Abdomen is morbidly obese.  Organs are difficult to assess.   Central nervous system: Alert and oriented.  Patient moves all extremities.  Extremities: Chronic skin changes, with what appears to be improving cellulitis of right lower leg and ulcers.  Data Reviewed: I have personally reviewed following labs and imaging studies  CBC: Recent Labs  Lab 10/30/20 0226 10/31/20 0021  WBC 13.3* 10.3  NEUTROABS 11.4* 8.4*  HGB 13.1 11.6*  HCT 38.6* 34.3*  MCV 95.8 95.5  PLT 201 833   Basic Metabolic Panel: Recent Labs  Lab 10/30/20 0226 10/31/20 0021  NA 138 134*  K 3.5 3.3*  CL 103 101  CO2 24 25  GLUCOSE 334* 277*  BUN 37* 28*  CREATININE 2.07* 2.19*  CALCIUM 8.7* 8.3*  MG 1.7  --    GFR: Estimated Creatinine Clearance: 64.4 mL/min (A) (by C-G formula based on SCr of 2.19 mg/dL (H)). Liver Function Tests: Recent Labs  Lab 10/30/20 0226 10/31/20 0021  AST 24 15  ALT 24 19  ALKPHOS 52 43  BILITOT 0.8 0.9  PROT 6.5 5.8*  ALBUMIN 2.9* 2.5*   No results for input(s): LIPASE, AMYLASE in the last 168 hours. No results for input(s): AMMONIA in the last 168 hours. Coagulation Profile: Recent Labs  Lab 10/30/20 0226  INR 1.0   Cardiac Enzymes: No results for input(s): CKTOTAL, CKMB, CKMBINDEX, TROPONINI in the last 168  hours. BNP (last 3 results) No results for input(s): PROBNP in the last 8760 hours. HbA1C: No results for input(s): HGBA1C in the last 72 hours. CBG: Recent Labs  Lab 10/30/20 1717 10/30/20 2020  GLUCAP 341* 318*   Lipid Profile: No results for input(s): CHOL, HDL, LDLCALC, TRIG, CHOLHDL, LDLDIRECT in the last 72 hours. Thyroid Function Tests: No results for input(s): TSH, T4TOTAL, FREET4, T3FREE, THYROIDAB in the last 72 hours. Anemia Panel: No results for input(s): VITAMINB12, FOLATE, FERRITIN, TIBC, IRON, RETICCTPCT in the last 72 hours. Urine analysis:    Component Value Date/Time   COLORURINE STRAW (A) 10/30/2020 0215   APPEARANCEUR CLEAR 10/30/2020 0215   LABSPEC 1.015 10/30/2020 0215   PHURINE 6.0 10/30/2020 0215   GLUCOSEU >=500 (A) 10/30/2020 0215   HGBUR SMALL (A) 10/30/2020 0215   BILIRUBINUR NEGATIVE 10/30/2020 0215   KETONESUR 5 (A) 10/30/2020 0215   PROTEINUR >=300 (A) 10/30/2020 0215   NITRITE NEGATIVE 10/30/2020 0215   LEUKOCYTESUR NEGATIVE 10/30/2020 0215   Sepsis Labs: @LABRCNTIP (procalcitonin:4,lacticidven:4)  ) Recent Results (from the past 240 hour(s))  Blood Culture (routine x 2)     Status: Abnormal (Preliminary result)  Collection Time: 10/30/20  2:57 AM   Specimen: BLOOD  Result Value Ref Range Status   Specimen Description BLOOD RIGHT ANTECUBITAL  Final   Special Requests   Final    BOTTLES DRAWN AEROBIC AND ANAEROBIC Blood Culture adequate volume   Culture  Setup Time   Final    GRAM POSITIVE COCCI IN CHAINS IN BOTH AEROBIC AND ANAEROBIC BOTTLES CRITICAL RESULT CALLED TO, READ BACK BY AND VERIFIED WITH: [place] [id number] FCP    Culture (A)  Final    GROUP B STREP(S.AGALACTIAE)ISOLATED SUSCEPTIBILITIES TO FOLLOW Performed at [facility], [place] [address] [place], [place] [address]    Report Status PENDING  Incomplete  Blood Culture ID Panel (Reflexed)     Status: Abnormal   Collection Time: 10/30/20  2:57 AM  Result  Value Ref Range Status   Enterococcus faecalis NOT DETECTED NOT DETECTED Final   Enterococcus Faecium NOT DETECTED NOT DETECTED Final   Listeria monocytogenes NOT DETECTED NOT DETECTED Final   Staphylococcus species NOT DETECTED NOT DETECTED Final   Staphylococcus aureus (BCID) NOT DETECTED NOT DETECTED Final   Staphylococcus epidermidis NOT DETECTED NOT DETECTED Final   Staphylococcus lugdunensis NOT DETECTED NOT DETECTED Final   Streptococcus species DETECTED (A) NOT DETECTED Final    Comment: CRITICAL RESULT CALLED TO, READ BACK BY AND VERIFIED WITH: PHARMD FRANK W. [id number] FCP    Streptococcus agalactiae DETECTED (A) NOT DETECTED Final    Comment: CRITICAL RESULT CALLED TO, READ BACK BY AND VERIFIED WITH: PHARMD FRANK W. [id number] FCP    Streptococcus pneumoniae NOT DETECTED NOT DETECTED Final   Streptococcus pyogenes NOT DETECTED NOT DETECTED Final   A.calcoaceticus-baumannii NOT DETECTED NOT DETECTED Final   Bacteroides fragilis NOT DETECTED NOT DETECTED Final   Enterobacterales NOT DETECTED NOT DETECTED Final   Enterobacter cloacae complex NOT DETECTED NOT DETECTED Final   Escherichia coli NOT DETECTED NOT DETECTED Final   Klebsiella aerogenes NOT DETECTED NOT DETECTED Final   Klebsiella oxytoca NOT DETECTED NOT DETECTED Final   Klebsiella pneumoniae NOT DETECTED NOT DETECTED Final   Proteus species NOT DETECTED NOT DETECTED Final   Salmonella species NOT DETECTED NOT DETECTED Final   Serratia marcescens NOT DETECTED NOT DETECTED Final   Haemophilus influenzae NOT DETECTED NOT DETECTED Final   Neisseria meningitidis NOT DETECTED NOT DETECTED Final   Pseudomonas aeruginosa NOT DETECTED NOT DETECTED Final   Stenotrophomonas maltophilia NOT DETECTED NOT DETECTED Final   Candida albicans NOT DETECTED NOT DETECTED Final   Candida auris NOT DETECTED NOT DETECTED Final   Candida glabrata NOT DETECTED NOT DETECTED Final   Candida krusei NOT DETECTED NOT DETECTED Final    Candida parapsilosis NOT DETECTED NOT DETECTED Final   Candida tropicalis NOT DETECTED NOT DETECTED Final   Cryptococcus neoformans/gattii NOT DETECTED NOT DETECTED Final    Comment: Performed at [facility], [place]. [address], [place], [place] [address]  Blood Culture (routine x 2)     Status: Abnormal (Preliminary result)   Collection Time: 10/30/20  3:50 AM   Specimen: BLOOD RIGHT FOREARM  Result Value Ref Range Status   Specimen Description BLOOD RIGHT FOREARM  Final   Special Requests   Final    BOTTLES DRAWN AEROBIC AND ANAEROBIC Blood Culture results may not be optimal due to an inadequate volume of blood received in culture bottles   Culture  Setup Time   Final    GRAM POSITIVE COCCI IN CHAINS IN BOTH AEROBIC  AND ANAEROBIC BOTTLES Performed at [facility], [place] [address], [place], [place] [address]    Culture GROUP B STREP(S.AGALACTIAE)ISOLATED (A)  Final   Report Status PENDING  Incomplete  Resp Panel by RT-PCR (Flu A&B, Covid) Nasopharyngeal Swab     Status: None   Collection Time: 10/30/20  3:57 AM   Specimen: Nasopharyngeal Swab; Nasopharyngeal(NP) swabs in vial transport medium  Result Value Ref Range Status   SARS Coronavirus 2 by RT PCR NEGATIVE NEGATIVE Final    Comment: (NOTE) SARS-CoV-2 target nucleic acids are NOT DETECTED.  The SARS-CoV-2 RNA is generally detectable in upper respiratory specimens during the acute phase of infection. The lowest concentration of SARS-CoV-2 viral copies this assay can detect is 138 copies/mL. A negative result does not preclude SARS-Cov-2 infection and should not be used as the sole basis for treatment or other patient management decisions. A negative result may occur with  improper specimen collection/handling, submission of specimen other than nasopharyngeal swab, presence of viral mutation(s) within the areas targeted by this assay, and inadequate number of viral copies(<138 copies/mL). A negative result must be  combined with clinical observations, patient history, and epidemiological information. The expected result is Negative.  Fact Sheet for Patients:  EntrepreneurPulse.com.au  Fact Sheet for Healthcare Providers:  IncredibleEmployment.be  This test is no t yet approved or cleared by the [place] FDA and  has been authorized for detection and/or diagnosis of SARS-CoV-2 by FDA under an Emergency Use Authorization (EUA). This EUA will remain  in effect (meaning this test can be used) for the duration of the COVID-19 declaration under Section 564(b)(1) of the Act, 21 U.S.C.section 360bbb-3(b)(1), unless the authorization is terminated  or revoked sooner.       Influenza A by PCR NEGATIVE NEGATIVE Final   Influenza B by PCR NEGATIVE NEGATIVE Final    Comment: (NOTE) The Xpert Xpress SARS-CoV-2/FLU/RSV plus assay is intended as an aid in the diagnosis of influenza from Nasopharyngeal swab specimens and should not be used as a sole basis for treatment. Nasal washings and aspirates are unacceptable for Xpert Xpress SARS-CoV-2/FLU/RSV testing.  Fact Sheet for Patients: EntrepreneurPulse.com.au  Fact Sheet for Healthcare Providers: IncredibleEmployment.be  This test is not yet approved or cleared by the [place] FDA and has been authorized for detection and/or diagnosis of SARS-CoV-2 by FDA under an Emergency Use Authorization (EUA). This EUA will remain in effect (meaning this test can be used) for the duration of the COVID-19 declaration under Section 564(b)(1) of the Act, 21 U.S.C. section 360bbb-3(b)(1), unless the authorization is terminated or revoked.  Performed at [facility], [place] [address] [place], [place] [address]          Radiology Studies: DG Chest 2 View  Result Date: 10/31/2020 CLINICAL DATA:  Shortness of breath. EXAM: CHEST - 2 VIEW COMPARISON:  10/30/2020.  CT  07/12/2018. FINDINGS: Mediastinum and hilar structures normal. Cardiomegaly. Low lung volumes with mild bilateral interstitial prominence. Calcified granulomas again noted. No pleural effusion or pneumothorax. IMPRESSION: Cardiomegaly with mild bilateral interstitial prominence. Mild interstitial edema cannot be excluded. Electronically Signed   By: Marcello Moores  Register   On: 10/31/2020 05:44   DG Chest Portable 1 View  Result Date: 10/30/2020 CLINICAL DATA:  Chest pain, tachycardia, diaphoresis EXAM: PORTABLE CHEST 1 VIEW COMPARISON:  04/27/2018 FINDINGS: Two frontal views of the chest demonstrate an unremarkable cardiac silhouette. No acute airspace disease, effusion, or pneumothorax. Stable bilateral calcified granuloma. No acute bony abnormalities. IMPRESSION: 1.  No acute intrathoracic process. Electronically Signed   By: Randa Ngo M.D.   On: 10/30/2020 03:01   DG Foot Complete Right  Result Date: 10/30/2020 CLINICAL DATA:  Pain, puncture wound EXAM: RIGHT FOOT COMPLETE - 3+ VIEW COMPARISON:  None. FINDINGS: No fracture or malalignment. Status post amputation of first digit at the level of the IP joint. Moderate degenerative change at the first MTP joint. Tiny focus of probable gas within the mid sole of the foot presumably due to puncture wound. No periostitis or bony destructive change. Dorsal degenerative changes of the midfoot IMPRESSION: Partial amputation of first digit.  No acute osseous abnormality. Electronically Signed   By: Donavan Foil M.D.   On: 10/30/2020 03:57        Scheduled Meds: . carvedilol  25 mg Oral BID WC  . empagliflozin  25 mg Oral Daily  . guaiFENesin  600 mg Oral BID  . lisinopril  20 mg Oral Daily   And  . hydrochlorothiazide  12.5 mg Oral Daily  . insulin regular human CONCENTRATED  30 Units Subcutaneous TID WC  . ipratropium-albuterol  3 mL Nebulization BID  . isosorbide mononitrate  30 mg Oral Daily  . mupirocin ointment   Nasal BID  . pneumococcal 23  valent vaccine  0.5 mL Intramuscular Tomorrow-1000  . rivaroxaban  20 mg Oral Q supper  . Vitamin D (Ergocalciferol)  50,000 Units Oral Q Mon   Continuous Infusions: . pencillin G potassium IV 4 Million Units (10/31/20 1607)     LOS: 1 day    Time spent: 35 minutes    Dana Allan, MD  [facility] Pager #: [phone] 7PM-7AM contact night coverage as above

## 2020-10-31 NOTE — Progress Notes (Signed)
Patient noted to be wheezing, SOB and fine crackled on auscultation. MD notified. see new order c-xray done per order

## 2020-11-01 ENCOUNTER — Inpatient Hospital Stay (HOSPITAL_COMMUNITY): Payer: PRIVATE HEALTH INSURANCE

## 2020-11-01 ENCOUNTER — Encounter (HOSPITAL_COMMUNITY): Payer: PRIVATE HEALTH INSURANCE

## 2020-11-01 DIAGNOSIS — A419 Sepsis, unspecified organism: Secondary | ICD-10-CM

## 2020-11-01 DIAGNOSIS — R7881 Bacteremia: Secondary | ICD-10-CM | POA: Diagnosis not present

## 2020-11-01 DIAGNOSIS — L0390 Cellulitis, unspecified: Secondary | ICD-10-CM | POA: Diagnosis not present

## 2020-11-01 LAB — ECHOCARDIOGRAM COMPLETE
AR max vel: 3.42 cm2
AV Area VTI: 3.68 cm2
AV Area mean vel: 3.28 cm2
AV Mean grad: 5 mmHg
AV Peak grad: 8.6 mmHg
Ao pk vel: 1.47 m/s
Area-P 1/2: 5.02 cm2
Height: 77 in
S' Lateral: 3.5 cm
Weight: 6080 oz

## 2020-11-01 LAB — CBC WITH DIFFERENTIAL/PLATELET
Abs Immature Granulocytes: 0.06 10*3/uL (ref 0.00–0.07)
Basophils Absolute: 0.1 10*3/uL (ref 0.0–0.1)
Basophils Relative: 1 %
Eosinophils Absolute: 0.3 10*3/uL (ref 0.0–0.5)
Eosinophils Relative: 4 %
HCT: 34.2 % — ABNORMAL LOW (ref 39.0–52.0)
Hemoglobin: 11.7 g/dL — ABNORMAL LOW (ref 13.0–17.0)
Immature Granulocytes: 1 %
Lymphocytes Relative: 26 %
Lymphs Abs: 2 10*3/uL (ref 0.7–4.0)
MCH: 32.8 pg (ref 26.0–34.0)
MCHC: 34.2 g/dL (ref 30.0–36.0)
MCV: 95.8 fL (ref 80.0–100.0)
Monocytes Absolute: 1 10*3/uL (ref 0.1–1.0)
Monocytes Relative: 13 %
Neutro Abs: 4.2 10*3/uL (ref 1.7–7.7)
Neutrophils Relative %: 55 %
Platelets: 165 10*3/uL (ref 150–400)
RBC: 3.57 MIL/uL — ABNORMAL LOW (ref 4.22–5.81)
RDW: 13.6 % (ref 11.5–15.5)
WBC: 7.6 10*3/uL (ref 4.0–10.5)
nRBC: 0 % (ref 0.0–0.2)

## 2020-11-01 LAB — CULTURE, BLOOD (ROUTINE X 2): Special Requests: ADEQUATE

## 2020-11-01 MED ORDER — INSULIN ASPART 100 UNIT/ML IJ SOLN
0.0000 [IU] | Freq: Three times a day (TID) | INTRAMUSCULAR | Status: DC
  Administered 2020-11-02: 11 [IU] via SUBCUTANEOUS
  Administered 2020-11-02 – 2020-11-03 (×3): 7 [IU] via SUBCUTANEOUS
  Administered 2020-11-03: 4 [IU] via SUBCUTANEOUS
  Administered 2020-11-04: 7 [IU] via SUBCUTANEOUS
  Administered 2020-11-04: 4 [IU] via SUBCUTANEOUS

## 2020-11-01 MED ORDER — INSULIN ASPART 100 UNIT/ML IJ SOLN
0.0000 [IU] | Freq: Every day | INTRAMUSCULAR | Status: DC
  Administered 2020-11-01 – 2020-11-02 (×2): 3 [IU] via SUBCUTANEOUS
  Administered 2020-11-03: 2 [IU] via SUBCUTANEOUS

## 2020-11-01 MED ORDER — PERFLUTREN LIPID MICROSPHERE
1.0000 mL | INTRAVENOUS | Status: AC | PRN
  Administered 2020-11-01: 8 mL via INTRAVENOUS
  Filled 2020-11-01: qty 10

## 2020-11-01 NOTE — Progress Notes (Signed)
Pt on home cpap when RT entered pt room. Pt resting comfortably.

## 2020-11-01 NOTE — Consult Note (Addendum)
I have seen and examined the patient. I have personally reviewed the clinical findings, laboratory findings, microbiological data and imaging studies. The assessment and treatment plan was discussed with the  Advance Practice Provider, Mauricio Po  I agree with her/his recommendations except following additions/corrections.  High Grade Group B Streptococcus bacteremia in the setting of chronic bilateral leg swelling ? Venous insufficiency, DM, Obesity, CHF and multiple other co-morbidities. He was recently seen at PCP on 3/30 when he was prescribed a course of Doxycyline for 10 days . Wound cx ( superficial ) grew MSSA and Acinetobacter Iwoffii. Ciprofloxacin was added for 10 days after sensitivities resulted on 4/3. He was referred to wound care and has a fu on coming Monday.  At ED , febrile with leukocytosis. Blood cx 5/30 4/4 bottles Group B streptococcus  Denies any hardwares/valve/joint replacements  Labs/Imagings/Microbiological data reviewed  Exam       Agree with TTE ( he has at least 3 points in [facility] score) Fu repeat blood cultures Continue Penicillin as is WOC consult for rt leg superficial ulcer  Would benefit from venous reflux study to assess for venous insufficiency Elevate legs  Monitor CBC and BMP on antibiotics   Rosiland Oz, [place] [facility] [place] [facility]    Date of Admission:  10/30/2020     Total days of antibiotics 4               Reason for Consult: Cellulitis / Group B Strep Bacteremia  Referring Provider: Dr. Florene Glen Primary Care Provider: Raina Mina., MD   ASSESSMENT:  Mr. Sean Valentine is a 55 y/o caucasian male with cellulitis of the right lower extremity complicated with 3 ulcers and Group B streptococcus bacteremia. Repeat blood cultures are without growth in <12 hours. TTE has been ordered. May require TEE. Tolerating penicillin with no adverse side effects  and ulcers appear to be healing. Duration of treatment pending work up for endocarditis. Encouraged to keep legs elevated. Continue wound care recommendations per [place] RN. Encouraged blood sugar control to help reduce complicated healing and future risk of infection.Marland Kitchen   PLAN:  1. Continue Penicillin.  2. Monitor cultures for clearance of bacteremia. 3. Await TTE and may need possible TEE. 4. Wound care per [facility] RN recommendations.    Principal Problem:   Sepsis due to cellulitis [facility]) Active Problems:   Essential hypertension   GERD (gastroesophageal reflux disease)   Mild persistent asthma   Mixed hyperlipidemia   Type 2 diabetes mellitus with stage 3b chronic kidney disease, with long-term current use of insulin (HCC)   Obesity, Class III, BMI 40-49.9 (morbid obesity) ([place])   Sleep apnea   Sepsis ([place])   . carvedilol  25 mg Oral BID WC  . empagliflozin  25 mg Oral Daily  . guaiFENesin  600 mg Oral BID  . lisinopril  20 mg Oral Daily   And  . hydrochlorothiazide  12.5 mg Oral Daily  . insulin regular human CONCENTRATED  30 Units Subcutaneous TID WC  . isosorbide mononitrate  30 mg Oral Daily  . mupirocin ointment   Nasal BID  . pneumococcal 23 valent vaccine  0.5 mL Intramuscular Tomorrow-1000  . rivaroxaban  20 mg Oral Q supper  . Vitamin D (Ergocalciferol)  50,000 Units Oral Q Mon     HPI: Sean Valentine is a 55 y.o. 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  Mr. Seidman was initially seen by his PCP with a wound on his right lower extremity at the time measuring 3 inches long by 2 inches wide with a secondary open wound above the first. Both draining exudate. Wound culture was taken and was  started on doxycycline for 10 days. Wound culture was positive for MSSA and Acinetobacter. Ciprofloxacin was added 3 days later. Completed treatment and was referred to Wound Care on 10/16/20.  Mr. Eden Lathe began feeling worse the day of admission at first starting having fevers that night. On arrival he had a temperature of 103.5. Chest x-ray with no acute process. X-ray right foot with no acute osseous abnormalities. Started on broad spectrum coverage with vancomycin, cefepime and metronidazole. Blood cultures became positive for Group B Streptococcus and was transitioned to Penicillin. Currently on Day for of antibiotic therapy with Penicillin and has been afebrile over the past 48 hours. Wounds and redness are slowly improving. Echocardiogram pending. Repeat blood cultures from 5/31 have been without growth for in less than <12 hours.    Review of Systems: Review of Systems  Constitutional: Negative for chills, fever and weight loss.  Respiratory: Negative for cough, shortness of breath and wheezing.   Cardiovascular: Positive for leg swelling. Negative for chest pain.  Gastrointestinal: Negative for abdominal pain, constipation, diarrhea, nausea and vomiting.  Skin: Negative for rash.     Past Medical History:  Diagnosis Date  . Anemia, chronic disease 05/26/2018   Formatting of this note might be different from the original. Eval with [facility] and phlebotomy for Hemochromotosis.  . Anticoagulant long-term use 11/26/2016  . Arthritis of right foot 05/08/2016  . Benign hypertension with CKD (chronic kidney disease) stage III ([place]) 07/12/2019  . Bilateral leg edema   . Chest pain at rest 08/16/2020  . CHF (congestive heart failure) ([place]) 03/05/2012  . Deformity of toe 08/01/2020  . Diabetic ulcer of toe of right foot associated with type 2 diabetes mellitus, with fat layer exposed ([place]) 01/10/2020   Formatting of this note might be different from the original. Distal tuft right fifth toe  .  Dyslipidemia 03/05/2012  . Essential hypertension 11/26/2016  . GERD (gastroesophageal reflux disease) 08/01/2020  . Gouty arthritis of left hand 01/15/2017  . High risk medication use 11/26/2016  . History of COVID-19 05/05/2020   Formatting of this note might be different from the original. 02/2020  . History of pulmonary embolism 11/26/2016   Formatting of this note might be different from the original. Felt severe with permanent anticoagulation and Filter placed because of burden.  . Hypertension   . Localized edema 03/05/2012  . LVH (left ventricular hypertrophy) 05/26/2018   Formatting of this note might be different from the original. 04/2018 EF 555-60%  . Malaise and fatigue 11/26/2016  . MGUS (monoclonal gammopathy of unknown significance) 06/03/2018   Formatting of this note might be different from the original. Possible. Seen on SPEP 05/2018  . Mild persistent asthma 11/26/2016  . Mixed hyperlipidemia 11/26/2016  . Morbid (severe) obesity due to excess calories ([place]) 03/05/2012  . Morbid obesity with body mass index (BMI) of 40.0 to 49.9 ([place]) 11/26/2016  . Nail dystrophy 05/20/2018  . Nephrolithiasis 03/05/2012  . Nonalcoholic steatohepatitis ([place]) 05/26/2018  . Other hemochromatosis 03/21/2020  . Posterior tibial tendinitis of right  lower extremity 08/01/2020  . Pre-ulcerative calluses 06/18/2019  . Primary central sleep apnea 11/26/2016   Formatting of this note might be different from the original. bipap  . Sleep apnea 03/05/2012   Formatting of this note might be different from the original. [place].  Marland Kitchen Spinal stenosis of lumbar region 05/26/2018  . Status post amputation of toe of right foot ([place]) 07/26/2015  . Subfibular impingement, right 03/13/2016  . Tinea pedis 08/01/2020  . Type 2 diabetes mellitus with stage 3b chronic kidney disease, with long-term current use of insulin ([place]) 07/12/2019   Formatting of this note might be different from the original. IMO 10/01 Updates  Formatting of  this note might be different from the original. 2000    Social History   Tobacco Use  . Smoking status: Former Smoker    Years: 1.00    Quit date: 1986    Years since quitting: 36.4  . Smokeless tobacco: Never Used    Family History  Problem Relation Age of Onset  . Cancer Mother   . Clotting disorder Mother   . Stroke Mother   . Alzheimer's disease Mother   . Diabetes Mother   . Diabetes Father     No Known Allergies  OBJECTIVE: Blood pressure 133/69, pulse 65, temperature 98.5 F (36.9 C), temperature source Oral, resp. rate 16, height 6\' 5"  (1.956 m), weight (!) 172.4 kg, SpO2 92 %.  Physical Exam Constitutional:      General: He is not in acute distress.    Appearance: He is well-developed.  Cardiovascular:     Rate and Rhythm: Normal rate and regular rhythm.     Heart sounds: Normal heart sounds.  Pulmonary:     Effort: Pulmonary effort is normal.     Breath sounds: Normal breath sounds.  Skin:    General: Skin is warm and dry.  Neurological:     Mental Status: He is alert and oriented to person, place, and time.  Psychiatric:        Behavior: Behavior normal.        Thought Content: Thought content normal.        Judgment: Judgment normal.     Lab Results Lab Results  Component Value Date   WBC 7.6 11/01/2020   HGB 11.7 (L) 11/01/2020   HCT 34.2 (L) 11/01/2020   MCV 95.8 11/01/2020   PLT 165 11/01/2020    Lab Results  Component Value Date   CREATININE 2.19 (H) 10/31/2020   BUN 28 (H) 10/31/2020   NA 134 (L) 10/31/2020   K 3.3 (L) 10/31/2020   CL 101 10/31/2020   CO2 25 10/31/2020    Lab Results  Component Value Date   ALT 19 10/31/2020   AST 15 10/31/2020   ALKPHOS 43 10/31/2020   BILITOT 0.9 10/31/2020     Microbiology: Recent Results (from the past 240 hour(s))  Blood Culture (routine x 2)     Status: Abnormal   Collection Time: 10/30/20  2:57 AM   Specimen: BLOOD  Result Value Ref Range Status   Specimen Description BLOOD  RIGHT ANTECUBITAL  Final   Special Requests   Final    BOTTLES DRAWN AEROBIC AND ANAEROBIC Blood Culture adequate volume   Culture  Setup Time   Final    GRAM POSITIVE COCCI IN CHAINS IN BOTH AEROBIC AND ANAEROBIC BOTTLES CRITICAL RESULT CALLED TO, READ BACK BY AND VERIFIED WITH: Lafe Garin [phone] FCP Performed at [facility], [address] [place], [place] [address]    Culture GROUP B STREP(S.AGALACTIAE)ISOLATED (A)  Final   Report Status 11/01/2020 FINAL  Final   Organism ID, Bacteria GROUP B STREP(S.AGALACTIAE)ISOLATED  Final      Susceptibility   Group b strep(s.agalactiae)isolated - MIC*    CLINDAMYCIN >=1 RESISTANT Resistant     AMPICILLIN <=0.25 SENSITIVE Sensitive     ERYTHROMYCIN >=8 RESISTANT Resistant     VANCOMYCIN 0.5 SENSITIVE Sensitive     CEFTRIAXONE <=0.12 SENSITIVE Sensitive     LEVOFLOXACIN 1 SENSITIVE Sensitive     * GROUP B STREP(S.AGALACTIAE)ISOLATED  Blood Culture ID Panel (Reflexed)     Status: Abnormal   Collection Time: 10/30/20  2:57 AM  Result Value Ref Range Status   Enterococcus faecalis NOT DETECTED NOT DETECTED Final   Enterococcus Faecium NOT DETECTED NOT DETECTED Final   Listeria monocytogenes NOT DETECTED NOT DETECTED Final   Staphylococcus species NOT DETECTED NOT DETECTED Final   Staphylococcus aureus (BCID) NOT DETECTED NOT DETECTED Final   Staphylococcus epidermidis NOT DETECTED NOT DETECTED Final   Staphylococcus lugdunensis NOT DETECTED NOT DETECTED Final   Streptococcus species DETECTED (A) NOT DETECTED Final    Comment: CRITICAL RESULT CALLED TO, READ BACK BY AND VERIFIED WITH: PHARMD FRANK W. [phone] FCP    Streptococcus agalactiae DETECTED (A) NOT DETECTED Final    Comment: CRITICAL RESULT CALLED TO, READ BACK BY AND VERIFIED WITH: PHARMD FRANK W. [phone] FCP    Streptococcus pneumoniae NOT DETECTED NOT DETECTED Final   Streptococcus pyogenes NOT DETECTED NOT DETECTED Final   A.calcoaceticus-baumannii NOT  DETECTED NOT DETECTED Final   Bacteroides fragilis NOT DETECTED NOT DETECTED Final   Enterobacterales NOT DETECTED NOT DETECTED Final   Enterobacter cloacae complex NOT DETECTED NOT DETECTED Final   Escherichia coli NOT DETECTED NOT DETECTED Final   Klebsiella aerogenes NOT DETECTED NOT DETECTED Final   Klebsiella oxytoca NOT DETECTED NOT DETECTED Final   Klebsiella pneumoniae NOT DETECTED NOT DETECTED Final   Proteus species NOT DETECTED NOT DETECTED Final   Salmonella species NOT DETECTED NOT DETECTED Final   Serratia marcescens NOT DETECTED NOT DETECTED Final   Haemophilus influenzae NOT DETECTED NOT DETECTED Final   Neisseria meningitidis NOT DETECTED NOT DETECTED Final   Pseudomonas aeruginosa NOT DETECTED NOT DETECTED Final   Stenotrophomonas maltophilia NOT DETECTED NOT DETECTED Final   Candida albicans NOT DETECTED NOT DETECTED Final   Candida auris NOT DETECTED NOT DETECTED Final   Candida glabrata NOT DETECTED NOT DETECTED Final   Candida krusei NOT DETECTED NOT DETECTED Final   Candida parapsilosis NOT DETECTED NOT DETECTED Final   Candida tropicalis NOT DETECTED NOT DETECTED Final   Cryptococcus neoformans/gattii NOT DETECTED NOT DETECTED Final    Comment: Performed at [facility], [address], [place], [place] [address]  Blood Culture (routine x 2)     Status: Abnormal   Collection Time: 10/30/20  3:50 AM   Specimen: BLOOD RIGHT FOREARM  Result Value Ref Range Status   Specimen Description BLOOD RIGHT FOREARM  Final   Special Requests   Final    BOTTLES DRAWN AEROBIC AND ANAEROBIC Blood Culture results may not be optimal due to an inadequate volume of blood received in culture bottles   Culture  Setup Time   Final    GRAM POSITIVE COCCI IN CHAINS IN BOTH AEROBIC AND ANAEROBIC BOTTLES    Culture (A)  Final    GROUP B STREP(S.AGALACTIAE)ISOLATED SUSCEPTIBILITIES PERFORMED ON PREVIOUS CULTURE  WITHIN THE LAST 5 DAYS. Performed at [facility], [place] [address] [place], [place] [address]    Report Status 11/01/2020 FINAL  Final  Resp Panel by RT-PCR (Flu A&B, Covid) Nasopharyngeal Swab     Status: None   Collection Time: 10/30/20  3:57 AM   Specimen: Nasopharyngeal Swab; Nasopharyngeal(NP) swabs in vial transport medium  Result Value Ref Range Status   SARS Coronavirus 2 by RT PCR NEGATIVE NEGATIVE Final    Comment: (NOTE) SARS-CoV-2 target nucleic acids are NOT DETECTED.  The SARS-CoV-2 RNA is generally detectable in upper respiratory specimens during the acute phase of infection. The lowest concentration of SARS-CoV-2 viral copies this assay can detect is 138 copies/mL. A negative result does not preclude SARS-Cov-2 infection and should not be used as the sole basis for treatment or other patient management decisions. A negative result may occur with  improper specimen collection/handling, submission of specimen other than nasopharyngeal swab, presence of viral mutation(s) within the areas targeted by this assay, and inadequate number of viral copies(<138 copies/mL). A negative result must be combined with clinical observations, patient history, and epidemiological information. The expected result is Negative.  Fact Sheet for Patients:  EntrepreneurPulse.com.au  Fact Sheet for Healthcare Providers:  IncredibleEmployment.be  This test is no t yet approved or cleared by the [place] FDA and  has been authorized for detection and/or diagnosis of SARS-CoV-2 by FDA under an Emergency Use Authorization (EUA). This EUA will remain  in effect (meaning this test can be used) for the duration of the COVID-19 declaration under Section 564(b)(1) of the Act, 21 U.S.C.section 360bbb-3(b)(1), unless the authorization is terminated  or revoked sooner.       Influenza A by PCR NEGATIVE NEGATIVE Final   Influenza B by PCR NEGATIVE NEGATIVE Final    Comment: (NOTE) The Xpert Xpress SARS-CoV-2/FLU/RSV  plus assay is intended as an aid in the diagnosis of influenza from Nasopharyngeal swab specimens and should not be used as a sole basis for treatment. Nasal washings and aspirates are unacceptable for Xpert Xpress SARS-CoV-2/FLU/RSV testing.  Fact Sheet for Patients: EntrepreneurPulse.com.au  Fact Sheet for Healthcare Providers: IncredibleEmployment.be  This test is not yet approved or cleared by the [place] FDA and has been authorized for detection and/or diagnosis of SARS-CoV-2 by FDA under an Emergency Use Authorization (EUA). This EUA will remain in effect (meaning this test can be used) for the duration of the COVID-19 declaration under Section 564(b)(1) of the Act, 21 U.S.C. section 360bbb-3(b)(1), unless the authorization is terminated or revoked.  Performed at [facility], [place] [address] [place], [place] [address]   Culture, blood (routine x 2)     Status: None (Preliminary result)   Collection Time: 10/31/20  8:19 PM   Specimen: BLOOD  Result Value Ref Range Status   Specimen Description BLOOD LEFT ANTECUBITAL  Final   Special Requests   Final    BOTTLES DRAWN AEROBIC AND ANAEROBIC Blood Culture adequate volume   Culture   Final    NO GROWTH < 12 HOURS Performed at [facility], [place] [address] [place], [place] [address]    Report Status PENDING  Incomplete  Culture, blood (routine x 2)     Status: None (Preliminary result)   Collection Time: 10/31/20  8:22 PM   Specimen: BLOOD LEFT HAND  Result Value Ref Range Status   Specimen Description BLOOD LEFT HAND  Final   Special Requests   Final  BOTTLES DRAWN AEROBIC AND ANAEROBIC Blood Culture adequate volume   Culture   Final    NO GROWTH < 12 HOURS Performed at [facility], [place] [address], [place],  [address]    Report Status PENDING  Incomplete    Pertinent Imagings 10/31/20 ( personally reviewed) Chest Xray  FINDINGS: Mediastinum and  hilar structures normal. Cardiomegaly. Low lung volumes with mild bilateral interstitial prominence. Calcified granulomas again noted. No pleural effusion or pneumothorax.  IMPRESSION: Cardiomegaly with mild bilateral interstitial prominence. Mild interstitial edema cannot be excluded.  Rt Foot Xray 10/30/20 FINDINGS: No fracture or malalignment. Status post amputation of first digit at the level of the IP joint. Moderate degenerative change at the first MTP joint. Tiny focus of probable gas within the mid sole of the foot presumably due to puncture wound. No periostitis or bony destructive change. Dorsal degenerative changes of the midfoot  IMPRESSION: Partial amputation of first digit.  No acute osseous abnormality.   Terri Piedra, NP [place] [facility]  11/01/2020  11:37 AM

## 2020-11-01 NOTE — Progress Notes (Signed)
PROGRESS NOTE    Sean Valentine  XTG:[id number] DOB: [birth year] DOA: 10/30/2020 PCP: Raina Mina., MD   Chief Complaint  Patient presents with  . 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  Patient was admitted with SIRS/sepsis syndrome likely secondary to right lower extremity cellulitis and infected ulcers.  He's being treated for cellulitis and GBS bacteremia.   Assessment & Plan:   Principal Problem:   Sepsis due to cellulitis [facility]) Active Problems:   Essential hypertension   GERD (gastroesophageal reflux disease)   Mild persistent asthma   Mixed hyperlipidemia   Type 2 diabetes mellitus with stage 3b chronic kidney disease, with long-term current use of insulin (HCC)   Obesity, Class III, BMI 40-49.9 (morbid obesity) ([place])   Sleep apnea   Sepsis ([place])  Sepsis due to cellulitis  GBS Bacteremia 2/2 Cellulitis -Sepsis physiology has resolved (sepsis ruled in with fever, leukocytosis and cellulitis/bacteremia) -Cellulitis seems to be improving. -Blood culture grew group B strep (sensistive to ampicillin) -repeat blood cultures from 5/31 pending  -Patient is currently on IV penicillin G. -echo with EF 50-55%, no RWMA - no evidence of valvular vegetation on TTE (defer ? Of TEE to ID) - follow [place] of L foot nodule  -ID c/s, appreciate recs  Essential hypertension Continue amlodipine 5 mg p.o. daily. Continue carvedilol 25 mg p.o.  daily. Hold ace/thiazide with aki below  Monitor BP, renal function electrolytes.  AKI on CKD IIIb - baseline appears to be ~1.8 in 03/2020 - creatinine above baseline today - hold ace/thiazide for now - trend creatinine  GERD (gastroesophageal reflux disease) Protonix 40 mg p.o. daily.  Mild persistent asthma Supplemental oxygen and bronchodilators as needed. Stable  Mixed hyperlipidemia Off statin. History of NASH.  Type 2 diabetes mellitus with stage 3b chronic kidney disease, with long-term current use of insulin (HCC) Carbohydrate modified diet. Continue concentrated insulin 30 units with meals. Continue Jardiance 25 mg p.o. daily. SSI  Obesity, Class III, BMI 40-49.9 (morbid obesity) (HCC) Lifestyle modifications. Follow-up with PCP.  Sleep apnea BiPAP at bedtime.  DVT prophylaxis: xarelto Code Status: full  Family Communication: wife at bedside Disposition:   Status is: Inpatient  Remains inpatient appropriate because:Inpatient level of care appropriate due to severity of illness   Dispo: The patient is from: Home              Anticipated d/c is to: Home              Patient currently is not medically stable to d/c.   Difficult to place patient No       Consultants:   ID  Procedures: Echo IMPRESSIONS    1. Left ventricular ejection fraction, by estimation, is 50 to 55%. The  left ventricle has low normal function. The left ventricle has no regional  wall motion abnormalities. There is mild left ventricular hypertrophy.  Left ventricular diastolic  parameters are indeterminate. Elevated left ventricular end-diastolic  pressure.  2. Right ventricular systolic function is normal. The right ventricular  size is normal. Tricuspid regurgitation signal is  inadequate for assessing  PA pressure.  3. The mitral valve is normal in structure. Trivial mitral valve  regurgitation. No evidence of mitral stenosis.  4. The aortic valve is  grossly normal. Aortic valve regurgitation is not  visualized. No aortic stenosis is present.  5. Aortic dilatation noted. There is mild dilatation of the aortic root,  measuring 40 mm.   Conclusion(s)/Recommendation(s): No evidence of valvular vegetations on  this transthoracic echocardiogram. Would consider Murle Hellstrom transesophageal  echocardiogram to exclude infective endocarditis if clinically indicated.   Antimicrobials: Anti-infectives (From admission, onward)   Start     Dose/Rate Route Frequency Ordered Stop   10/31/20 0400  vancomycin (VANCOREADY) IVPB 2000 mg/400 mL  Status:  Discontinued        2,000 mg 200 mL/hr over 120 Minutes Intravenous Every 24 hours 10/30/20 0631 10/30/20 1729   10/30/20 2000  penicillin G potassium 4 Million Units in dextrose 5 % 250 mL IVPB        4 Million Units 250 mL/hr over 60 Minutes Intravenous Every 4 hours 10/30/20 1729     10/30/20 1200  ceFEPIme (MAXIPIME) 2 g in sodium chloride 0.9 % 100 mL IVPB  Status:  Discontinued        2 g 200 mL/hr over 30 Minutes Intravenous Every 8 hours 10/30/20 0631 10/30/20 1729   10/30/20 1000  metroNIDAZOLE (FLAGYL) IVPB 500 mg  Status:  Discontinued        500 mg 100 mL/hr over 60 Minutes Intravenous Every 8 hours 10/30/20 0613 10/30/20 1729   10/30/20 0300  vancomycin (VANCOCIN) 2,500 mg in sodium chloride 0.9 % 500 mL IVPB        2,500 mg 250 mL/hr over 120 Minutes Intravenous  Once 10/30/20 0230 10/30/20 0632   10/30/20 0245  ceFEPIme (MAXIPIME) 2 g in sodium chloride 0.9 % 100 mL IVPB        2 g 200 mL/hr over 30 Minutes Intravenous  Once 10/30/20 0230 10/30/20 0453   10/30/20 0245  metroNIDAZOLE (FLAGYL) IVPB 500 mg        500 mg 100 mL/hr over 60 Minutes Intravenous  Once 10/30/20 0230 10/30/20 0406         Subjective: No new complaints, feels [name] little better  Objective: Vitals:   10/31/20 2016 10/31/20 2034 11/01/20 0405 11/01/20 1339  BP:  (!) 140/59 133/69 127/62  Pulse:  75 65 70  Resp:   20 16 16   Temp:  98.7 F (37.1 C) 98.5 F (36.9 C) 98.5 F (36.9 C)  TempSrc:  Oral Oral Oral  SpO2: 96% 92% 92% 94%  Weight:      Height:        Intake/Output Summary (Last 24 hours) at 11/01/2020 1710 Last data filed at 11/01/2020 1300 Gross per 24 hour  Intake 720 ml  Output --  Net 720 ml   Filed Weights   10/30/20 0203  Weight: (!) 172.4 kg    Examination:  General exam: Appears calm and comfortable  Respiratory system: Clear to auscultation. Respiratory effort normal. Cardiovascular system: S1 & S2 heard, RRR.  Gastrointestinal system: Abdomen is nondistended, soft and nontender.  Central nervous system: Alert and oriented. No focal neurological deficits. Extremities: LLE with erythema and ulcerations, nodule on plantar surface of foot  Data Reviewed: I have personally reviewed following labs and imaging studies  CBC: Recent Labs  Lab 10/30/20 0226 10/31/20 0021 11/01/20 0156  WBC 13.3* 10.3 7.6  NEUTROABS 11.4* 8.4* 4.2  HGB  13.1 11.6* 11.7*  HCT 38.6* 34.3* 34.2*  MCV 95.8 95.5 95.8  PLT 201 182 629    Basic Metabolic Panel: Recent Labs  Lab 10/30/20 0226 10/31/20 0021  NA 138 134*  K 3.5 3.3*  CL 103 101  CO2 24 25  GLUCOSE 334* 277*  BUN 37* 28*  CREATININE 2.07* 2.19*  CALCIUM 8.7* 8.3*  MG 1.7  --     GFR: Estimated Creatinine Clearance: 64.4 mL/min (Roosevelt Eimers) (by C-G formula based on SCr of 2.19 mg/dL (H)).  Liver Function Tests: Recent Labs  Lab 10/30/20 0226 10/31/20 0021  AST 24 15  ALT 24 19  ALKPHOS 52 43  BILITOT 0.8 0.9  PROT 6.5 5.8*  ALBUMIN 2.9* 2.5*    CBG: Recent Labs  Lab 10/30/20 1717 10/30/20 2020  GLUCAP 341* 318*     Recent Results (from the past 240 hour(s))  Blood Culture (routine x 2)     Status: Abnormal   Collection Time: 10/30/20  2:57 AM   Specimen: BLOOD  Result Value Ref Range Status   Specimen Description BLOOD RIGHT ANTECUBITAL  Final   Special Requests   Final    BOTTLES DRAWN AEROBIC AND  ANAEROBIC Blood Culture adequate volume   Culture  Setup Time   Final    GRAM POSITIVE COCCI IN CHAINS IN BOTH AEROBIC AND ANAEROBIC BOTTLES CRITICAL RESULT CALLED TO, READ BACK BY AND VERIFIED WITH: Lafe Garin [phone] FCP Performed at [facility], [address], [place], [place] [address]    Culture GROUP B STREP(S.AGALACTIAE)ISOLATED (Rolin Schult)  Final   Report Status 11/01/2020 FINAL  Final   Organism ID, Bacteria GROUP B STREP(S.AGALACTIAE)ISOLATED  Final      Susceptibility   Group b strep(s.agalactiae)isolated - MIC*    CLINDAMYCIN >=1 RESISTANT Resistant     AMPICILLIN <=0.25 SENSITIVE Sensitive     ERYTHROMYCIN >=8 RESISTANT Resistant     VANCOMYCIN 0.5 SENSITIVE Sensitive     CEFTRIAXONE <=0.12 SENSITIVE Sensitive     LEVOFLOXACIN 1 SENSITIVE Sensitive     * GROUP B STREP(S.AGALACTIAE)ISOLATED  Blood Culture ID Panel (Reflexed)     Status: Abnormal   Collection Time: 10/30/20  2:57 AM  Result Value Ref Range Status   Enterococcus faecalis NOT DETECTED NOT DETECTED Final   Enterococcus Faecium NOT DETECTED NOT DETECTED Final   Listeria monocytogenes NOT DETECTED NOT DETECTED Final   Staphylococcus species NOT DETECTED NOT DETECTED Final   Staphylococcus aureus (BCID) NOT DETECTED NOT DETECTED Final   Staphylococcus epidermidis NOT DETECTED NOT DETECTED Final   Staphylococcus lugdunensis NOT DETECTED NOT DETECTED Final   Streptococcus species DETECTED (Silviano Neuser) NOT DETECTED Final    Comment: CRITICAL RESULT CALLED TO, READ BACK BY AND VERIFIED WITH: PHARMD FRANK W. [phone] FCP    Streptococcus agalactiae DETECTED (Brode Sculley) NOT DETECTED Final    Comment: CRITICAL RESULT CALLED TO, READ BACK BY AND VERIFIED WITH: PHARMD FRANK W. [phone] FCP    Streptococcus pneumoniae NOT DETECTED NOT DETECTED Final   Streptococcus pyogenes NOT DETECTED NOT DETECTED Final   Regene Mccarthy.calcoaceticus-baumannii NOT DETECTED NOT DETECTED Final   Bacteroides fragilis NOT DETECTED NOT DETECTED  Final   Enterobacterales NOT DETECTED NOT DETECTED Final   Enterobacter cloacae complex NOT DETECTED NOT DETECTED Final   Escherichia coli NOT DETECTED NOT DETECTED Final   Klebsiella aerogenes NOT DETECTED NOT DETECTED Final   Klebsiella oxytoca NOT DETECTED NOT DETECTED Final   Klebsiella pneumoniae NOT DETECTED NOT DETECTED Final  Proteus species NOT DETECTED NOT DETECTED Final   Salmonella species NOT DETECTED NOT DETECTED Final   Serratia marcescens NOT DETECTED NOT DETECTED Final   Haemophilus influenzae NOT DETECTED NOT DETECTED Final   Neisseria meningitidis NOT DETECTED NOT DETECTED Final   Pseudomonas aeruginosa NOT DETECTED NOT DETECTED Final   Stenotrophomonas maltophilia NOT DETECTED NOT DETECTED Final   Candida albicans NOT DETECTED NOT DETECTED Final   Candida auris NOT DETECTED NOT DETECTED Final   Candida glabrata NOT DETECTED NOT DETECTED Final   Candida krusei NOT DETECTED NOT DETECTED Final   Candida parapsilosis NOT DETECTED NOT DETECTED Final   Candida tropicalis NOT DETECTED NOT DETECTED Final   Cryptococcus neoformans/gattii NOT DETECTED NOT DETECTED Final    Comment: Performed at [facility], [address], [place], [place] [address]  Blood Culture (routine x 2)     Status: Abnormal   Collection Time: 10/30/20  3:50 AM   Specimen: BLOOD RIGHT FOREARM  Result Value Ref Range Status   Specimen Description BLOOD RIGHT FOREARM  Final   Special Requests   Final    BOTTLES DRAWN AEROBIC AND ANAEROBIC Blood Culture results may not be optimal due to an inadequate volume of blood received in culture bottles   Culture  Setup Time   Final    GRAM POSITIVE COCCI IN CHAINS IN BOTH AEROBIC AND ANAEROBIC BOTTLES    Culture (Havilah Topor)  Final    GROUP B STREP(S.AGALACTIAE)ISOLATED SUSCEPTIBILITIES PERFORMED ON PREVIOUS CULTURE WITHIN THE LAST 5 DAYS. Performed at [facility], [place] [address], [place], [place] [address]    Report Status 11/01/2020 FINAL  Final   Resp Panel by RT-PCR (Flu Cherilyn Sautter&B, Covid) Nasopharyngeal Swab     Status: None   Collection Time: 10/30/20  3:57 AM   Specimen: Nasopharyngeal Swab; Nasopharyngeal(NP) swabs in vial transport medium  Result Value Ref Range Status   SARS Coronavirus 2 by RT PCR NEGATIVE NEGATIVE Final    Comment: (NOTE) SARS-CoV-2 target nucleic acids are NOT DETECTED.  The SARS-CoV-2 RNA is generally detectable in upper respiratory specimens during the acute phase of infection. The lowest concentration of SARS-CoV-2 viral copies this assay can detect is 138 copies/mL. Ishmael Berkovich negative result does not preclude SARS-Cov-2 infection and should not be used as the sole basis for treatment or other patient management decisions. Rondell Pardon negative result may occur with  improper specimen collection/handling, submission of specimen other than nasopharyngeal swab, presence of viral mutation(s) within the areas targeted by this assay, and inadequate number of viral copies(<138 copies/mL). Jaliyah Fotheringham negative result must be combined with clinical observations, patient history, and epidemiological information. The expected result is Negative.  Fact Sheet for Patients:  EntrepreneurPulse.com.au  Fact Sheet for Healthcare Providers:  IncredibleEmployment.be  This test is no t yet approved or cleared by the [place] FDA and  has been authorized for detection and/or diagnosis of SARS-CoV-2 by FDA under an Emergency Use Authorization (EUA). This EUA will remain  in effect (meaning this test can be used) for the duration of the COVID-19 declaration under Section 564(b)(1) of the Act, 21 U.S.C.section 360bbb-3(b)(1), unless the authorization is terminated  or revoked sooner.       Influenza Vania Rosero by PCR NEGATIVE NEGATIVE Final   Influenza B by PCR NEGATIVE NEGATIVE Final    Comment: (NOTE) The Xpert Xpress SARS-CoV-2/FLU/RSV plus assay is intended as an aid in the diagnosis of influenza from  Nasopharyngeal swab specimens and should not be used as Keondra Haydu sole  basis for treatment. Nasal washings and aspirates are unacceptable for Xpert Xpress SARS-CoV-2/FLU/RSV testing.  Fact Sheet for Patients: EntrepreneurPulse.com.au  Fact Sheet for Healthcare Providers: IncredibleEmployment.be  This test is not yet approved or cleared by the [place] FDA and has been authorized for detection and/or diagnosis of SARS-CoV-2 by FDA under an Emergency Use Authorization (EUA). This EUA will remain in effect (meaning this test can be used) for the duration of the COVID-19 declaration under Section 564(b)(1) of the Act, 21 U.S.C. section 360bbb-3(b)(1), unless the authorization is terminated or revoked.  Performed at [facility], [place] [address], [place], [place] [address]   Culture, blood (routine x 2)     Status: None (Preliminary result)   Collection Time: 10/31/20  8:19 PM   Specimen: BLOOD  Result Value Ref Range Status   Specimen Description BLOOD LEFT ANTECUBITAL  Final   Special Requests   Final    BOTTLES DRAWN AEROBIC AND ANAEROBIC Blood Culture adequate volume   Culture   Final    NO GROWTH < 12 HOURS Performed at [facility], [place] [address], [place], [place] [address]    Report Status PENDING  Incomplete  Culture, blood (routine x 2)     Status: None (Preliminary result)   Collection Time: 10/31/20  8:22 PM   Specimen: BLOOD LEFT HAND  Result Value Ref Range Status   Specimen Description BLOOD LEFT HAND  Final   Special Requests   Final    BOTTLES DRAWN AEROBIC AND ANAEROBIC Blood Culture adequate volume   Culture   Final    NO GROWTH < 12 HOURS Performed at [facility], [place] [address] [place], [place] [address]    Report Status PENDING  Incomplete         Radiology Studies: DG Chest 2 View  Result Date: 10/31/2020 CLINICAL DATA:  Shortness of breath. EXAM: CHEST - 2 VIEW COMPARISON:  10/30/2020.  CT  07/12/2018. FINDINGS: Mediastinum and hilar structures normal. Cardiomegaly. Low lung volumes with mild bilateral interstitial prominence. Calcified granulomas again noted. No pleural effusion or pneumothorax. IMPRESSION: Cardiomegaly with mild bilateral interstitial prominence. Mild interstitial edema cannot be excluded. Electronically Signed   By: Marcello Moores  Register   On: 10/31/2020 05:44   ECHOCARDIOGRAM COMPLETE  Result Date: 11/01/2020    ECHOCARDIOGRAM REPORT   Patient Name:   Sean Valentine Date of Exam: 11/01/2020 Medical Rec #:  [id number]   Height:       77.0 in Accession #:    [id number]  Weight:       380.0 lb Date of Birth:  1965-10-18   BSA:          2.938 m Patient Age:    32 years    BP:           133/69 mmHg Patient Gender: M           HR:           65 bpm. Exam Location:  Inpatient Procedure: 2D Echo, Cardiac Doppler and Color Doppler Indications:    Bacteremia  History:        Patient has no prior history of Echocardiogram examinations.                 CHF; Risk Factors:Diabetes, Hypertension and Dyslipidemia.  Sonographer:    Cammy Brochure Referring Phys: [phone] Shaunice Levitan CALDWELL POWELL JR  Sonographer Comments: Image acquisition challenging due to patient body habitus. IMPRESSIONS  1. Left ventricular  ejection fraction, by estimation, is 50 to 55%. The left ventricle has low normal function. The left ventricle has no regional wall motion abnormalities. There is mild left ventricular hypertrophy. Left ventricular diastolic parameters are indeterminate. Elevated left ventricular end-diastolic pressure.  2. Right ventricular systolic function is normal. The right ventricular size is normal. Tricuspid regurgitation signal is inadequate for assessing PA pressure.  3. The mitral valve is normal in structure. Trivial mitral valve regurgitation. No evidence of mitral stenosis.  4. The aortic valve is grossly normal. Aortic valve regurgitation is not visualized. No aortic stenosis is present.  5. Aortic  dilatation noted. There is mild dilatation of the aortic root, measuring 40 mm. Conclusion(s)/Recommendation(s): No evidence of valvular vegetations on this transthoracic echocardiogram. Would consider Anasofia Micallef transesophageal echocardiogram to exclude infective endocarditis if clinically indicated. FINDINGS  Left Ventricle: Left ventricular ejection fraction, by estimation, is 50 to 55%. The left ventricle has low normal function. The left ventricle has no regional wall motion abnormalities. The left ventricular internal cavity size was normal in size. There is mild left ventricular hypertrophy. Left ventricular diastolic parameters are indeterminate. Elevated left ventricular end-diastolic pressure. Right Ventricle: The right ventricular size is normal. No increase in right ventricular wall thickness. Right ventricular systolic function is normal. Tricuspid regurgitation signal is inadequate for assessing PA pressure. Left Atrium: Left atrial size was normal in size. Right Atrium: Right atrial size was normal in size. Pericardium: Trivial pericardial effusion is present. Mitral Valve: The mitral valve is normal in structure. Trivial mitral valve regurgitation. No evidence of mitral valve stenosis. Tricuspid Valve: The tricuspid valve is normal in structure. Tricuspid valve regurgitation is trivial. No evidence of tricuspid stenosis. Aortic Valve: The aortic valve is grossly normal. Aortic valve regurgitation is not visualized. No aortic stenosis is present. Aortic valve mean gradient measures 5.0 mmHg. Aortic valve peak gradient measures 8.6 mmHg. Aortic valve area, by VTI measures 3.68 cm. Pulmonic Valve: The pulmonic valve was normal in structure. Pulmonic valve regurgitation is trivial. No evidence of pulmonic stenosis. Aorta: Aortic dilatation noted. There is mild dilatation of the aortic root, measuring 40 mm. IAS/Shunts: No atrial level shunt detected by color flow Doppler.  LEFT VENTRICLE PLAX 2D LVIDd:          5.00 cm  Diastology LVIDs:         3.50 cm  LV e' medial:    4.35 cm/s LV PW:         1.50 cm  LV E/e' medial:  26.2 LV IVS:        1.30 cm  LV e' lateral:   6.53 cm/s LVOT diam:     2.30 cm  LV E/e' lateral: 17.5 LV SV:         116 LV SV Index:   39 LVOT Area:     4.15 cm  RIGHT VENTRICLE RV Basal diam:  3.00 cm RV S prime:     14.80 cm/s TAPSE (M-mode): 2.3 cm LEFT ATRIUM             Index       RIGHT ATRIUM           Index LA diam:        4.50 cm 1.53 cm/m  RA Area:     17.20 cm LA Vol (A2C):   65.5 ml 22.29 ml/m RA Volume:   45.90 ml  15.62 ml/m LA Vol (A4C):   78.9 ml 26.83 ml/m LA Biplane Vol: 67.9 ml 23.11 ml/m  AORTIC VALVE AV Area (Vmax):    3.42 cm AV Area (Vmean):   3.28 cm AV Area (VTI):     3.68 cm AV Vmax:           147.00 cm/s AV Vmean:          103.000 cm/s AV VTI:            0.315 m AV Peak Grad:      8.6 mmHg AV Mean Grad:      5.0 mmHg LVOT Vmax:         121.00 cm/s LVOT Vmean:        81.200 cm/s LVOT VTI:          0.279 m LVOT/AV VTI ratio: 0.89  AORTA Ao Root diam: 4.00 cm Ao Asc diam:  3.50 cm MITRAL VALVE MV Area (PHT): 5.02 cm     SHUNTS MV Decel Time: 151 msec     Systemic VTI:  0.28 m MV E velocity: 114.00 cm/s  Systemic Diam: 2.30 cm MV Errica Dutil velocity: 109.00 cm/s MV E/Karrington Studnicka ratio:  1.05 Cherlynn Kaiser MD Electronically signed by Cherlynn Kaiser MD Signature Date/Time: 11/01/2020/4:06:37 PM    Final         Scheduled Meds: . carvedilol  25 mg Oral BID WC  . empagliflozin  25 mg Oral Daily  . guaiFENesin  600 mg Oral BID  . lisinopril  20 mg Oral Daily   And  . hydrochlorothiazide  12.5 mg Oral Daily  . insulin regular human CONCENTRATED  30 Units Subcutaneous TID WC  . isosorbide mononitrate  30 mg Oral Daily  . mupirocin ointment   Nasal BID  . pneumococcal 23 valent vaccine  0.5 mL Intramuscular Tomorrow-1000  . rivaroxaban  20 mg Oral Q supper  . Vitamin D (Ergocalciferol)  50,000 Units Oral Q Mon   Continuous Infusions: . pencillin G potassium IV 4 Million Units  (11/01/20 1636)     LOS: 2 days    Time spent: over 30 min    Fayrene Helper, MD [facility]   To contact the attending provider between 7A-7P or the covering provider during after hours 7P-7A, please log into the web site [email or web address] and access using universal [place] password for that web site. If you do not have the password, please call the hospital operator.  11/01/2020, 5:10 PM

## 2020-11-02 DIAGNOSIS — L0390 Cellulitis, unspecified: Secondary | ICD-10-CM | POA: Diagnosis not present

## 2020-11-02 DIAGNOSIS — I10 Essential (primary) hypertension: Secondary | ICD-10-CM | POA: Insufficient documentation

## 2020-11-02 DIAGNOSIS — A419 Sepsis, unspecified organism: Secondary | ICD-10-CM | POA: Diagnosis not present

## 2020-11-02 DIAGNOSIS — R600 Localized edema: Secondary | ICD-10-CM | POA: Insufficient documentation

## 2020-11-02 LAB — CBC WITH DIFFERENTIAL/PLATELET
Abs Immature Granulocytes: 0.09 10*3/uL — ABNORMAL HIGH (ref 0.00–0.07)
Basophils Absolute: 0.1 10*3/uL (ref 0.0–0.1)
Basophils Relative: 1 %
Eosinophils Absolute: 0.3 10*3/uL (ref 0.0–0.5)
Eosinophils Relative: 4 %
HCT: 34 % — ABNORMAL LOW (ref 39.0–52.0)
Hemoglobin: 11.8 g/dL — ABNORMAL LOW (ref 13.0–17.0)
Immature Granulocytes: 1 %
Lymphocytes Relative: 27 %
Lymphs Abs: 1.9 10*3/uL (ref 0.7–4.0)
MCH: 32.9 pg (ref 26.0–34.0)
MCHC: 34.7 g/dL (ref 30.0–36.0)
MCV: 94.7 fL (ref 80.0–100.0)
Monocytes Absolute: 0.7 10*3/uL (ref 0.1–1.0)
Monocytes Relative: 10 %
Neutro Abs: 4.2 10*3/uL (ref 1.7–7.7)
Neutrophils Relative %: 57 %
Platelets: 187 10*3/uL (ref 150–400)
RBC: 3.59 MIL/uL — ABNORMAL LOW (ref 4.22–5.81)
RDW: 13.4 % (ref 11.5–15.5)
WBC: 7.2 10*3/uL (ref 4.0–10.5)
nRBC: 0 % (ref 0.0–0.2)

## 2020-11-02 LAB — COMPREHENSIVE METABOLIC PANEL
ALT: 18 U/L (ref 0–44)
AST: 16 U/L (ref 15–41)
Albumin: 2.4 g/dL — ABNORMAL LOW (ref 3.5–5.0)
Alkaline Phosphatase: 40 U/L (ref 38–126)
Anion gap: 9 (ref 5–15)
BUN: 33 mg/dL — ABNORMAL HIGH (ref 6–20)
CO2: 28 mmol/L (ref 22–32)
Calcium: 8.7 mg/dL — ABNORMAL LOW (ref 8.9–10.3)
Chloride: 99 mmol/L (ref 98–111)
Creatinine, Ser: 2.04 mg/dL — ABNORMAL HIGH (ref 0.61–1.24)
GFR, Estimated: 37 mL/min — ABNORMAL LOW (ref >60)
Glucose, Bld: 200 mg/dL — ABNORMAL HIGH (ref 70–99)
Potassium: 3.3 mmol/L — ABNORMAL LOW (ref 3.5–5.1)
Sodium: 136 mmol/L (ref 135–145)
Total Bilirubin: 0.7 mg/dL (ref 0.3–1.2)
Total Protein: 6.1 g/dL — ABNORMAL LOW (ref 6.5–8.1)

## 2020-11-02 LAB — MAGNESIUM: Magnesium: 1.9 mg/dL (ref 1.7–2.4)

## 2020-11-02 LAB — HEMOGLOBIN A1C
Hgb A1c MFr Bld: 10.5 % — ABNORMAL HIGH (ref 4.8–5.6)
Mean Plasma Glucose: 255 mg/dL

## 2020-11-02 LAB — PHOSPHORUS: Phosphorus: 2.9 mg/dL (ref 2.5–4.6)

## 2020-11-02 MED ORDER — SODIUM CHLORIDE 0.9 % IV SOLN: INTRAVENOUS | Status: DC

## 2020-11-02 MED ORDER — POTASSIUM CHLORIDE CRYS ER 20 MEQ PO TBCR
40.0000 meq | EXTENDED_RELEASE_TABLET | Freq: Once | ORAL | Status: AC
  Administered 2020-11-02: 40 meq via ORAL
  Filled 2020-11-02: qty 2

## 2020-11-02 NOTE — H&P (View-Only) (Signed)
PROGRESS NOTE    Sean Valentine  YDX:[id number] DOB: 09/16/1965 DOA: 10/30/2020 PCP: Raina Mina., MD   Chief Complaint  Patient presents with  . 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  Patient was admitted with SIRS/sepsis syndrome likely secondary to right lower extremity cellulitis and infected ulcers.  He's being treated for cellulitis and GBS bacteremia.   Assessment & Plan:   Principal Problem:   Sepsis due to cellulitis [facility]) Active Problems:   Essential hypertension   GERD (gastroesophageal reflux disease)   Mild persistent asthma   Mixed hyperlipidemia   Type 2 diabetes mellitus with stage 3b chronic kidney disease, with long-term current use of insulin (HCC)   Obesity, Class III, BMI 40-49.9 (morbid obesity) ([place])   Sleep apnea   Sepsis ([place])  Sepsis due to cellulitis  GBS Bacteremia 2/2 Cellulitis -Sepsis physiology has resolved (sepsis ruled in with fever, leukocytosis and cellulitis/bacteremia) -Cellulitis seems to be improving. -Blood culture grew group B strep (sensistive to ampicillin) -repeat blood cultures from 5/31 NGTDx2 -Patient is currently on IV penicillin G - 6 mm fluid collection in plantar R forefoot at site of puncture wound, will discuss with surgery need for drainage?  -echo with EF 50-55%, no RWMA - no evidence of valvular vegetation on TTE  -Planning for TEE 6/3 -ID c/s, appreciate recs -> penicillin,  follow cultures, recommending TEE  Essential hypertension Continue amlodipine 5 mg p.o. daily. Continue carvedilol 25 mg p.o. daily. Hold ace/thiazide with aki below  Monitor BP, renal function electrolytes.  AKI on CKD IIIb - baseline appears to be ~1.8 in 03/2020 - creatinine above baseline today - hold ace/thiazide for now - trend creatinine  GERD (gastroesophageal reflux disease) Protonix 40 mg p.o. daily.  Mild persistent asthma Supplemental oxygen and bronchodilators as needed. Stable  Mixed hyperlipidemia Off statin. History of NASH.  Type 2 diabetes mellitus with stage 3b chronic kidney disease, with long-term current use of insulin (HCC) Carbohydrate modified diet. Continue concentrated insulin 30 units with meals. Continue Jardiance 25 mg p.o. daily. SSI  Obesity, Class III, BMI 40-49.9 (morbid obesity) (HCC) Lifestyle modifications. Follow-up with PCP.  Sleep apnea BiPAP at bedtime.  DVT prophylaxis: xarelto Code Status: full  Family Communication: wife at bedside Disposition:   Status is: Inpatient  Remains inpatient appropriate because:Inpatient level of care appropriate due to severity of illness   Dispo: The patient is from: Home              Anticipated d/c is to: Home              Patient currently is not medically stable to d/c.   Difficult to place patient No       Consultants:   ID  Procedures: Echo IMPRESSIONS    1. Left ventricular ejection fraction, by estimation, is 50 to 55%. The  left ventricle has low normal function. The left ventricle has no regional  wall motion abnormalities. There is mild left ventricular hypertrophy.  Left ventricular diastolic  parameters are indeterminate. Elevated left ventricular end-diastolic  pressure.  2. Right ventricular systolic function is normal. The right ventricular  size is normal. Tricuspid regurgitation signal is inadequate for assessing  PA pressure.  3. The mitral  valve is normal in structure. Trivial mitral valve  regurgitation. No evidence of mitral stenosis.  4. The aortic valve is grossly normal. Aortic valve regurgitation is not  visualized. No aortic stenosis is present.  5. Aortic dilatation noted. There is mild dilatation of the aortic root,  measuring 40 mm.   Conclusion(s)/Recommendation(s): No evidence of valvular vegetations on  this transthoracic echocardiogram. Would consider Sean Valentine transesophageal  echocardiogram to exclude infective endocarditis if clinically indicated.   Antimicrobials: Anti-infectives (From admission, onward)   Start     Dose/Rate Route Frequency Ordered Stop   10/31/20 0400  vancomycin (VANCOREADY) IVPB 2000 mg/400 mL  Status:  Discontinued        2,000 mg 200 mL/hr over 120 Minutes Intravenous Every 24 hours 10/30/20 0631 10/30/20 1729   10/30/20 2000  penicillin G potassium 4 Million Units in dextrose 5 % 250 mL IVPB        4 Million Units 250 mL/hr over 60 Minutes Intravenous Every 4 hours 10/30/20 1729     10/30/20 1200  ceFEPIme (MAXIPIME) 2 g in sodium chloride 0.9 % 100 mL IVPB  Status:  Discontinued        2 g 200 mL/hr over 30 Minutes Intravenous Every 8 hours 10/30/20 0631 10/30/20 1729   10/30/20 1000  metroNIDAZOLE (FLAGYL) IVPB 500 mg  Status:  Discontinued        500 mg 100 mL/hr over 60 Minutes Intravenous Every 8 hours 10/30/20 0613 10/30/20 1729   10/30/20 0300  vancomycin (VANCOCIN) 2,500 mg in sodium chloride 0.9 % 500 mL IVPB        2,500 mg 250 mL/hr over 120 Minutes Intravenous  Once 10/30/20 0230 10/30/20 0632   10/30/20 0245  ceFEPIme (MAXIPIME) 2 g in sodium chloride 0.9 % 100 mL IVPB        2 g 200 mL/hr over 30 Minutes Intravenous  Once 10/30/20 0230 10/30/20 0453   10/30/20 0245  metroNIDAZOLE (FLAGYL) IVPB 500 mg        500 mg 100 mL/hr over 60 Minutes Intravenous  Once 10/30/20 0230 10/30/20 0406         Subjective: No new complaints  Objective: Vitals:   11/01/20 1339  11/01/20 2016 11/02/20 0431 11/02/20 1305  BP: 127/62 (!) 148/67 (!) 148/62 135/69  Pulse: 70 74 62 72  Resp: 16 15 16 17   Temp: 98.5 F (36.9 C) 98.3 F (36.8 C) 98.3 F (36.8 C) 98.1 F (36.7 C)  TempSrc: Oral Oral Oral Oral  SpO2: 94% 95% 95% 94%  Weight:      Height:        Intake/Output Summary (Last 24 hours) at 11/02/2020 1620 Last data filed at 11/02/2020 1256 Gross per 24 hour  Intake 600 ml  Output --  Net 600 ml   Filed Weights   10/30/20 0203  Weight: (!) 172.4 kg    Examination:  General: No acute distress. Cardiovascular: Heart sounds show Sean Valentine regular rate, and rhythm.  Lungs: Clear to auscultation bilaterally  Abdomen: Soft, nontender, nondistended  Neurological: Alert and oriented 3. Moves all extremities 4. Cranial nerves II through XII grossly intact. Extremities: RLE with intact dressing  Data Reviewed: I have personally reviewed following labs and imaging studies  CBC: Recent Labs  Lab 10/30/20 0226 10/31/20 0021 11/01/20 0156 11/02/20 0030  WBC 13.3* 10.3 7.6 7.2  NEUTROABS 11.4* 8.4* 4.2 4.2  HGB 13.1 11.6* 11.7* 11.8*  HCT 38.6* 34.3* 34.2* 34.0*  MCV 95.8 95.5 95.8 94.7  PLT 201 182 165 616    Basic Metabolic Panel: Recent Labs  Lab 10/30/20 0226 10/31/20 0021 11/02/20 0030  NA 138 134* 136  K 3.5 3.3* 3.3*  CL 103 101 99  CO2 24 25 28   GLUCOSE 334* 277* 200*  BUN 37* 28* 33*  CREATININE 2.07* 2.19* 2.04*  CALCIUM 8.7* 8.3* 8.7*  MG 1.7  --  1.9  PHOS  --   --  2.9    GFR: Estimated Creatinine Clearance: 69.2 mL/min (Sean Valentine) (by C-G formula based on SCr of 2.04 mg/dL (H)).  Liver Function Tests: Recent Labs  Lab 10/30/20 0226 10/31/20 0021 11/02/20 0030  AST 24 15 16   ALT 24 19 18   ALKPHOS 52 43 40  BILITOT 0.8 0.9 0.7  PROT 6.5 5.8* 6.1*  ALBUMIN 2.9* 2.5* 2.4*    CBG: Recent Labs  Lab 10/30/20 1717 10/30/20 2020  GLUCAP 341* 318*     Recent Results (from the past 240 hour(s))  Blood Culture (routine x  2)     Status: Abnormal   Collection Time: 10/30/20  2:57 AM   Specimen: BLOOD  Result Value Ref Range Status   Specimen Description BLOOD RIGHT ANTECUBITAL  Final   Special Requests   Final    BOTTLES DRAWN AEROBIC AND ANAEROBIC Blood Culture adequate volume   Culture  Setup Time   Final    GRAM POSITIVE COCCI IN CHAINS IN BOTH AEROBIC AND ANAEROBIC BOTTLES CRITICAL RESULT CALLED TO, READ BACK BY AND VERIFIED WITH: Lafe Garin [phone] FCP Performed at [facility], [address], [place], [place] [address]    Culture GROUP B STREP(S.AGALACTIAE)ISOLATED (Sean Valentine)  Final   Report Status 11/01/2020 FINAL  Final   Organism ID, Bacteria GROUP B STREP(S.AGALACTIAE)ISOLATED  Final      Susceptibility   Group b strep(s.agalactiae)isolated - MIC*    CLINDAMYCIN >=1 RESISTANT Resistant     AMPICILLIN <=0.25 SENSITIVE Sensitive     ERYTHROMYCIN >=8 RESISTANT Resistant     VANCOMYCIN 0.5 SENSITIVE Sensitive     CEFTRIAXONE <=0.12 SENSITIVE Sensitive     LEVOFLOXACIN 1 SENSITIVE Sensitive     * GROUP B STREP(S.AGALACTIAE)ISOLATED  Blood Culture ID Panel (Reflexed)     Status: Abnormal   Collection Time: 10/30/20  2:57 AM  Result Value Ref Range Status   Enterococcus faecalis NOT DETECTED NOT DETECTED Final   Enterococcus Faecium NOT DETECTED NOT DETECTED Final   Listeria monocytogenes NOT DETECTED NOT DETECTED Final   Staphylococcus species NOT DETECTED NOT DETECTED Final   Staphylococcus aureus (BCID) NOT DETECTED NOT DETECTED Final   Staphylococcus epidermidis NOT DETECTED NOT DETECTED Final   Staphylococcus lugdunensis NOT DETECTED NOT DETECTED Final   Streptococcus species DETECTED (Sean Valentine) NOT DETECTED Final    Comment: CRITICAL RESULT CALLED TO, READ BACK BY AND VERIFIED WITH: PHARMD FRANK W. [phone] [id number] FCP    Streptococcus agalactiae DETECTED (Sean Valentine) NOT DETECTED Final    Comment: CRITICAL RESULT CALLED TO, READ BACK BY AND VERIFIED WITH: PHARMD FRANK W. [phone] FCP     Streptococcus pneumoniae NOT DETECTED NOT DETECTED Final   Streptococcus pyogenes NOT DETECTED NOT DETECTED Final   Tai Skelly.calcoaceticus-baumannii NOT DETECTED NOT DETECTED Final   Bacteroides fragilis NOT DETECTED NOT DETECTED Final   Enterobacterales NOT DETECTED NOT DETECTED Final  Enterobacter cloacae complex NOT DETECTED NOT DETECTED Final   Escherichia coli NOT DETECTED NOT DETECTED Final   Klebsiella aerogenes NOT DETECTED NOT DETECTED Final   Klebsiella oxytoca NOT DETECTED NOT DETECTED Final   Klebsiella pneumoniae NOT DETECTED NOT DETECTED Final   Proteus species NOT DETECTED NOT DETECTED Final   Salmonella species NOT DETECTED NOT DETECTED Final   Serratia marcescens NOT DETECTED NOT DETECTED Final   Haemophilus influenzae NOT DETECTED NOT DETECTED Final   Neisseria meningitidis NOT DETECTED NOT DETECTED Final   Pseudomonas aeruginosa NOT DETECTED NOT DETECTED Final   Stenotrophomonas maltophilia NOT DETECTED NOT DETECTED Final   Candida albicans NOT DETECTED NOT DETECTED Final   Candida auris NOT DETECTED NOT DETECTED Final   Candida glabrata NOT DETECTED NOT DETECTED Final   Candida krusei NOT DETECTED NOT DETECTED Final   Candida parapsilosis NOT DETECTED NOT DETECTED Final   Candida tropicalis NOT DETECTED NOT DETECTED Final   Cryptococcus neoformans/gattii NOT DETECTED NOT DETECTED Final    Comment: Performed at [facility], [place] [address] [place], [place] [address]  Blood Culture (routine x 2)     Status: Abnormal   Collection Time: 10/30/20  3:50 AM   Specimen: BLOOD RIGHT FOREARM  Result Value Ref Range Status   Specimen Description BLOOD RIGHT FOREARM  Final   Special Requests   Final    BOTTLES DRAWN AEROBIC AND ANAEROBIC Blood Culture results may not be optimal due to an inadequate volume of blood received in culture bottles   Culture  Setup Time   Final    GRAM POSITIVE COCCI IN CHAINS IN BOTH AEROBIC AND ANAEROBIC BOTTLES    Culture (Sean Valentine)  Final    GROUP  B STREP(S.AGALACTIAE)ISOLATED SUSCEPTIBILITIES PERFORMED ON PREVIOUS CULTURE WITHIN THE LAST 5 DAYS. Performed at [facility], [place] [address], [place], [place] [address]    Report Status 11/01/2020 FINAL  Final  Resp Panel by RT-PCR (Flu Ellysia Char&B, Covid) Nasopharyngeal Swab     Status: None   Collection Time: 10/30/20  3:57 AM   Specimen: Nasopharyngeal Swab; Nasopharyngeal(NP) swabs in vial transport medium  Result Value Ref Range Status   SARS Coronavirus 2 by RT PCR NEGATIVE NEGATIVE Final    Comment: (NOTE) SARS-CoV-2 target nucleic acids are NOT DETECTED.  The SARS-CoV-2 RNA is generally detectable in upper respiratory specimens during the acute phase of infection. The lowest concentration of SARS-CoV-2 viral copies this assay can detect is 138 copies/mL. Sean Valentine negative result does not preclude SARS-Cov-2 infection and should not be used as the sole basis for treatment or other patient management decisions. Sean Valentine negative result may occur with  improper specimen collection/handling, submission of specimen other than nasopharyngeal swab, presence of viral mutation(s) within the areas targeted by this assay, and inadequate number of viral copies(<138 copies/mL). Sean Valentine negative result must be combined with clinical observations, patient history, and epidemiological information. The expected result is Negative.  Fact Sheet for Patients:  EntrepreneurPulse.com.au  Fact Sheet for Healthcare Providers:  IncredibleEmployment.be  This test is no t yet approved or cleared by the [place] FDA and  has been authorized for detection and/or diagnosis of SARS-CoV-2 by FDA under an Emergency Use Authorization (EUA). This EUA will remain  in effect (meaning this test can be used) for the duration of the COVID-19 declaration under Section 564(b)(1) of the Act, 21 U.S.C.section 360bbb-3(b)(1), unless the authorization is terminated  or revoked sooner.        Influenza Sean Valentine by  PCR NEGATIVE NEGATIVE Final   Influenza B by PCR NEGATIVE NEGATIVE Final    Comment: (NOTE) The Xpert Xpress SARS-CoV-2/FLU/RSV plus assay is intended as an aid in the diagnosis of influenza from Nasopharyngeal swab specimens and should not be used as Akera Snowberger sole basis for treatment. Nasal washings and aspirates are unacceptable for Xpert Xpress SARS-CoV-2/FLU/RSV testing.  Fact Sheet for Patients: EntrepreneurPulse.com.au  Fact Sheet for Healthcare Providers: IncredibleEmployment.be  This test is not yet approved or cleared by the [place] FDA and has been authorized for detection and/or diagnosis of SARS-CoV-2 by FDA under an Emergency Use Authorization (EUA). This EUA will remain in effect (meaning this test can be used) for the duration of the COVID-19 declaration under Section 564(b)(1) of the Act, 21 U.S.C. section 360bbb-3(b)(1), unless the authorization is terminated or revoked.  Performed at [facility], [place] [address], [place], [place] [address]   Culture, blood (routine x 2)     Status: None (Preliminary result)   Collection Time: 10/31/20  8:19 PM   Specimen: BLOOD  Result Value Ref Range Status   Specimen Description BLOOD LEFT ANTECUBITAL  Final   Special Requests   Final    BOTTLES DRAWN AEROBIC AND ANAEROBIC Blood Culture adequate volume   Culture   Final    NO GROWTH 2 DAYS Performed at [facility], [place] [address], [place], [place] [address]    Report Status PENDING  Incomplete  Culture, blood (routine x 2)     Status: None (Preliminary result)   Collection Time: 10/31/20  8:22 PM   Specimen: BLOOD LEFT HAND  Result Value Ref Range Status   Specimen Description BLOOD LEFT HAND  Final   Special Requests   Final    BOTTLES DRAWN AEROBIC AND ANAEROBIC Blood Culture adequate volume   Culture   Final    NO GROWTH 2 DAYS Performed at [facility], [place] [address], [place], [place]  [address]    Report Status PENDING  Incomplete         Radiology Studies: ECHOCARDIOGRAM COMPLETE  Result Date: 11/01/2020    ECHOCARDIOGRAM REPORT   Patient Name:   Sean Valentine Date of Exam: 11/01/2020 Medical Rec #:  [id number]   Height:       77.0 in Accession #:    [id number]  Weight:       380.0 lb Date of Birth:  07-02-1965   BSA:          2.938 m Patient Age:    40 years    BP:           133/69 mmHg Patient Gender: M           HR:           65 bpm. Exam Location:  Inpatient Procedure: 2D Echo, Cardiac Doppler and Color Doppler Indications:    Bacteremia  History:        Patient has no prior history of Echocardiogram examinations.                 CHF; Risk Factors:Diabetes, Hypertension and Dyslipidemia.  Sonographer:    Cammy Brochure Referring Phys: [phone] Sean Valentine  Sonographer Comments: Image acquisition challenging due to patient body habitus. IMPRESSIONS  1. Left ventricular ejection fraction, by estimation, is 50 to 55%. The left ventricle has low normal function. The left ventricle has no regional wall motion abnormalities. There is mild left ventricular hypertrophy. Left ventricular diastolic  parameters are indeterminate. Elevated left ventricular end-diastolic pressure.  2. Right ventricular systolic function is normal. The right ventricular size is normal. Tricuspid regurgitation signal is inadequate for assessing PA pressure.  3. The mitral valve is normal in structure. Trivial mitral valve regurgitation. No evidence of mitral stenosis.  4. The aortic valve is grossly normal. Aortic valve regurgitation is not visualized. No aortic stenosis is present.  5. Aortic dilatation noted. There is mild dilatation of the aortic root, measuring 40 mm. Conclusion(s)/Recommendation(s): No evidence of valvular vegetations on this transthoracic echocardiogram. Would consider Desmen Schoffstall transesophageal echocardiogram to exclude infective endocarditis if clinically indicated. FINDINGS  Left Ventricle: Left  ventricular ejection fraction, by estimation, is 50 to 55%. The left ventricle has low normal function. The left ventricle has no regional wall motion abnormalities. The left ventricular internal cavity size was normal in size. There is mild left ventricular hypertrophy. Left ventricular diastolic parameters are indeterminate. Elevated left ventricular end-diastolic pressure. Right Ventricle: The right ventricular size is normal. No increase in right ventricular wall thickness. Right ventricular systolic function is normal. Tricuspid regurgitation signal is inadequate for assessing PA pressure. Left Atrium: Left atrial size was normal in size. Right Atrium: Right atrial size was normal in size. Pericardium: Trivial pericardial effusion is present. Mitral Valve: The mitral valve is normal in structure. Trivial mitral valve regurgitation. No evidence of mitral valve stenosis. Tricuspid Valve: The tricuspid valve is normal in structure. Tricuspid valve regurgitation is trivial. No evidence of tricuspid stenosis. Aortic Valve: The aortic valve is grossly normal. Aortic valve regurgitation is not visualized. No aortic stenosis is present. Aortic valve mean gradient measures 5.0 mmHg. Aortic valve peak gradient measures 8.6 mmHg. Aortic valve area, by VTI measures 3.68 cm. Pulmonic Valve: The pulmonic valve was normal in structure. Pulmonic valve regurgitation is trivial. No evidence of pulmonic stenosis. Aorta: Aortic dilatation noted. There is mild dilatation of the aortic root, measuring 40 mm. IAS/Shunts: No atrial level shunt detected by color flow Doppler.  LEFT VENTRICLE PLAX 2D LVIDd:         5.00 cm  Diastology LVIDs:         3.50 cm  LV e' medial:    4.35 cm/s LV PW:         1.50 cm  LV E/e' medial:  26.2 LV IVS:        1.30 cm  LV e' lateral:   6.53 cm/s LVOT diam:     2.30 cm  LV E/e' lateral: 17.5 LV SV:         116 LV SV Index:   39 LVOT Area:     4.15 cm  RIGHT VENTRICLE RV Basal diam:  3.00 cm RV S  prime:     14.80 cm/s TAPSE (M-mode): 2.3 cm LEFT ATRIUM             Index       RIGHT ATRIUM           Index LA diam:        4.50 cm 1.53 cm/m  RA Area:     17.20 cm LA Vol (A2C):   65.5 ml 22.29 ml/m RA Volume:   45.90 ml  15.62 ml/m LA Vol (A4C):   78.9 ml 26.83 ml/m LA Biplane Vol: 67.9 ml 23.11 ml/m  AORTIC VALVE AV Area (Vmax):    3.42 cm AV Area (Vmean):   3.28 cm AV Area (VTI):     3.68 cm AV Vmax:  147.00 cm/s AV Vmean:          103.000 cm/s AV VTI:            0.315 m AV Peak Grad:      8.6 mmHg AV Mean Grad:      5.0 mmHg LVOT Vmax:         121.00 cm/s LVOT Vmean:        81.200 cm/s LVOT VTI:          0.279 m LVOT/AV VTI ratio: 0.89  AORTA Ao Root diam: 4.00 cm Ao Asc diam:  3.50 cm MITRAL VALVE MV Area (PHT): 5.02 cm     SHUNTS MV Decel Time: 151 msec     Systemic VTI:  0.28 m MV E velocity: 114.00 cm/s  Systemic Diam: 2.30 cm MV Noorah Giammona velocity: 109.00 cm/s MV E/Shiya Fogelman ratio:  1.05 Cherlynn Kaiser MD Electronically signed by Cherlynn Kaiser MD Signature Date/Time: 11/01/2020/4:06:37 PM    Final    [place] RT LOWER EXTREM LTD SOFT TISSUE NON VASCULAR  Result Date: 11/01/2020 CLINICAL DATA:  Puncture wound right foot, cellulitis EXAM: ULTRASOUND RIGHT LOWER EXTREMITY LIMITED TECHNIQUE: Ultrasound examination of the lower extremity soft tissues was performed in the area of clinical concern. COMPARISON:  10/30/2020 FINDINGS: Sonographic evaluation of the plantar aspect of the right forefoot was performed at the site of the puncture wound. There is Tasha Jindra 6 mm fluid collection deep to the puncture wound, located approximately 10 mm deep to the skin surface, with small tract extending toward the skin surface. Superficial subcutaneous edema is identified. I do not see any foreign body. IMPRESSION: 1. 6 mm fluid collection in the plantar right forefoot at the site of the puncture wound. This is located approximately 10 mm deep to the skin surface. 2. No evidence of foreign body. Electronically Signed   By:  Randa Ngo M.D.   On: 11/01/2020 23:47        Scheduled Meds: . carvedilol  25 mg Oral BID WC  . empagliflozin  25 mg Oral Daily  . guaiFENesin  600 mg Oral BID  . insulin aspart  0-20 Units Subcutaneous TID WC  . insulin aspart  0-5 Units Subcutaneous QHS  . insulin regular human CONCENTRATED  30 Units Subcutaneous TID WC  . isosorbide mononitrate  30 mg Oral Daily  . mupirocin ointment   Nasal BID  . pneumococcal 23 valent vaccine  0.5 mL Intramuscular Tomorrow-1000  . rivaroxaban  20 mg Oral Q supper  . Vitamin D (Ergocalciferol)  50,000 Units Oral Q Mon   Continuous Infusions: . pencillin G potassium IV 4 Million Units (11/02/20 1200)     LOS: 3 days    Time spent: over 30 min    Fayrene Helper, MD [facility]   To contact the attending provider between 7A-7P or the covering provider during after hours 7P-7A, please log into the web site [email or web address] and access using universal [place] password for that web site. If you do not have the password, please call the hospital operator.  11/02/2020, 4:20 PM

## 2020-11-02 NOTE — Progress Notes (Addendum)
PROGRESS NOTE    Sean Valentine  EXH:[id number] DOB: [birth year] DOA: 10/30/2020 PCP: Raina Mina., MD   Chief Complaint  Patient presents with  . 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  Patient was admitted with SIRS/sepsis syndrome likely secondary to right lower extremity cellulitis and infected ulcers.  He's being treated for cellulitis and GBS bacteremia.   Assessment & Plan:   Principal Problem:   Sepsis due to cellulitis [facility]) Active Problems:   Essential hypertension   GERD (gastroesophageal reflux disease)   Mild persistent asthma   Mixed hyperlipidemia   Type 2 diabetes mellitus with stage 3b chronic kidney disease, with long-term current use of insulin (HCC)   Obesity, Class III, BMI 40-49.9 (morbid obesity) ([place])   Sleep apnea   Sepsis ([place])  Sepsis due to cellulitis  GBS Bacteremia 2/2 Cellulitis -Sepsis physiology has resolved (sepsis ruled in with fever, leukocytosis and cellulitis/bacteremia) -Cellulitis seems to be improving. -Blood culture grew group B strep (sensistive to ampicillin) -repeat blood cultures from 5/31 NGTDx2 -Patient is currently on IV penicillin G - 6 mm fluid collection in plantar R forefoot at site of puncture wound, will discuss with surgery need for drainage?  -echo with EF 50-55%, no RWMA - no evidence of valvular vegetation on TTE  -Planning for TEE 6/3 -ID c/s, appreciate recs -> penicillin,  follow cultures, recommending TEE  Essential hypertension Continue amlodipine 5 mg p.o. daily. Continue carvedilol 25 mg p.o. daily. Hold ace/thiazide with aki below  Monitor BP, renal function electrolytes.  AKI on CKD IIIb - baseline appears to be ~1.8 in 03/2020 - creatinine above baseline today - hold ace/thiazide for now - trend creatinine  GERD (gastroesophageal reflux disease) Protonix 40 mg p.o. daily.  Mild persistent asthma Supplemental oxygen and bronchodilators as needed. Stable  Mixed hyperlipidemia Off statin. History of NASH.  Type 2 diabetes mellitus with stage 3b chronic kidney disease, with long-term current use of insulin (HCC) Carbohydrate modified diet. Continue concentrated insulin 30 units with meals. Continue Jardiance 25 mg p.o. daily. SSI  Obesity, Class III, BMI 40-49.9 (morbid obesity) (HCC) Lifestyle modifications. Follow-up with PCP.  Sleep apnea BiPAP at bedtime.  DVT prophylaxis: xarelto Code Status: full  Family Communication: wife at bedside Disposition:   Status is: Inpatient  Remains inpatient appropriate because:Inpatient level of care appropriate due to severity of illness   Dispo: The patient is from: Home              Anticipated d/c is to: Home              Patient currently is not medically stable to d/c.   Difficult to place patient No       Consultants:   ID  Procedures: Echo IMPRESSIONS    1. Left ventricular ejection fraction, by estimation, is 50 to 55%. The  left ventricle has low normal function. The left ventricle has no regional  wall motion abnormalities. There is mild left ventricular hypertrophy.  Left ventricular diastolic  parameters are indeterminate. Elevated left ventricular end-diastolic  pressure.  2. Right ventricular systolic function is normal. The right ventricular  size is normal. Tricuspid regurgitation signal is inadequate for assessing  PA pressure.  3. The mitral  valve is normal in structure. Trivial mitral valve  regurgitation. No evidence of mitral stenosis.  4. The aortic valve is grossly normal. Aortic valve regurgitation is not  visualized. No aortic stenosis is present.  5. Aortic dilatation noted. There is mild dilatation of the aortic root,  measuring 40 mm.   Conclusion(s)/Recommendation(s): No evidence of valvular vegetations on  this transthoracic echocardiogram. Would consider Ezriel Boffa transesophageal  echocardiogram to exclude infective endocarditis if clinically indicated.   Antimicrobials: Anti-infectives (From admission, onward)   Start     Dose/Rate Route Frequency Ordered Stop   10/31/20 0400  vancomycin (VANCOREADY) IVPB 2000 mg/400 mL  Status:  Discontinued        2,000 mg 200 mL/hr over 120 Minutes Intravenous Every 24 hours 10/30/20 0631 10/30/20 1729   10/30/20 2000  penicillin G potassium 4 Million Units in dextrose 5 % 250 mL IVPB        4 Million Units 250 mL/hr over 60 Minutes Intravenous Every 4 hours 10/30/20 1729     10/30/20 1200  ceFEPIme (MAXIPIME) 2 g in sodium chloride 0.9 % 100 mL IVPB  Status:  Discontinued        2 g 200 mL/hr over 30 Minutes Intravenous Every 8 hours 10/30/20 0631 10/30/20 1729   10/30/20 1000  metroNIDAZOLE (FLAGYL) IVPB 500 mg  Status:  Discontinued        500 mg 100 mL/hr over 60 Minutes Intravenous Every 8 hours 10/30/20 0613 10/30/20 1729   10/30/20 0300  vancomycin (VANCOCIN) 2,500 mg in sodium chloride 0.9 % 500 mL IVPB        2,500 mg 250 mL/hr over 120 Minutes Intravenous  Once 10/30/20 0230 10/30/20 0632   10/30/20 0245  ceFEPIme (MAXIPIME) 2 g in sodium chloride 0.9 % 100 mL IVPB        2 g 200 mL/hr over 30 Minutes Intravenous  Once 10/30/20 0230 10/30/20 0453   10/30/20 0245  metroNIDAZOLE (FLAGYL) IVPB 500 mg        500 mg 100 mL/hr over 60 Minutes Intravenous  Once 10/30/20 0230 10/30/20 0406         Subjective: No new complaints  Objective: Vitals:   11/01/20 1339  11/01/20 2016 11/02/20 0431 11/02/20 1305  BP: 127/62 (!) 148/67 (!) 148/62 135/69  Pulse: 70 74 62 72  Resp: 16 15 16 17   Temp: 98.5 F (36.9 C) 98.3 F (36.8 C) 98.3 F (36.8 C) 98.1 F (36.7 C)  TempSrc: Oral Oral Oral Oral  SpO2: 94% 95% 95% 94%  Weight:      Height:        Intake/Output Summary (Last 24 hours) at 11/02/2020 1620 Last data filed at 11/02/2020 1256 Gross per 24 hour  Intake 600 ml  Output --  Net 600 ml   Filed Weights   10/30/20 0203  Weight: (!) 172.4 kg    Examination:  General: No acute distress. Cardiovascular: Heart sounds show Kiondre Grenz regular rate, and rhythm.  Lungs: Clear to auscultation bilaterally  Abdomen: Soft, nontender, nondistended  Neurological: Alert and oriented 3. Moves all extremities 4. Cranial nerves II through XII grossly intact. Extremities: RLE with intact dressing  Data Reviewed: I have personally reviewed following labs and imaging studies  CBC: Recent Labs  Lab 10/30/20 0226 10/31/20 0021 11/01/20 0156 11/02/20 0030  WBC 13.3* 10.3 7.6 7.2  NEUTROABS 11.4* 8.4* 4.2 4.2  HGB 13.1 11.6* 11.7* 11.8*  HCT 38.6* 34.3* 34.2* 34.0*  MCV 95.8 95.5 95.8 94.7  PLT 201 182 165 062    Basic Metabolic Panel: Recent Labs  Lab 10/30/20 0226 10/31/20 0021 11/02/20 0030  NA 138 134* 136  K 3.5 3.3* 3.3*  CL 103 101 99  CO2 24 25 28   GLUCOSE 334* 277* 200*  BUN 37* 28* 33*  CREATININE 2.07* 2.19* 2.04*  CALCIUM 8.7* 8.3* 8.7*  MG 1.7  --  1.9  PHOS  --   --  2.9    GFR: Estimated Creatinine Clearance: 69.2 mL/min (Ramin Zoll) (by C-G formula based on SCr of 2.04 mg/dL (H)).  Liver Function Tests: Recent Labs  Lab 10/30/20 0226 10/31/20 0021 11/02/20 0030  AST 24 15 16   ALT 24 19 18   ALKPHOS 52 43 40  BILITOT 0.8 0.9 0.7  PROT 6.5 5.8* 6.1*  ALBUMIN 2.9* 2.5* 2.4*    CBG: Recent Labs  Lab 10/30/20 1717 10/30/20 2020  GLUCAP 341* 318*     Recent Results (from the past 240 hour(s))  Blood Culture (routine x  2)     Status: Abnormal   Collection Time: 10/30/20  2:57 AM   Specimen: BLOOD  Result Value Ref Range Status   Specimen Description BLOOD RIGHT ANTECUBITAL  Final   Special Requests   Final    BOTTLES DRAWN AEROBIC AND ANAEROBIC Blood Culture adequate volume   Culture  Setup Time   Final    GRAM POSITIVE COCCI IN CHAINS IN BOTH AEROBIC AND ANAEROBIC BOTTLES CRITICAL RESULT CALLED TO, READ BACK BY AND VERIFIED WITH: Lafe Garin [phone] FCP Performed at [facility], [address] [place], [place] [address]    Culture GROUP B STREP(S.AGALACTIAE)ISOLATED ([place])  Final   Report Status 11/01/2020 FINAL  Final   Organism ID, Bacteria GROUP B STREP(S.AGALACTIAE)ISOLATED  Final      Susceptibility   Group b strep(s.agalactiae)isolated - MIC*    CLINDAMYCIN >=1 RESISTANT Resistant     AMPICILLIN <=0.25 SENSITIVE Sensitive     ERYTHROMYCIN >=8 RESISTANT Resistant     VANCOMYCIN 0.5 SENSITIVE Sensitive     CEFTRIAXONE <=0.12 SENSITIVE Sensitive     LEVOFLOXACIN 1 SENSITIVE Sensitive     * GROUP B STREP(S.AGALACTIAE)ISOLATED  Blood Culture ID Panel (Reflexed)     Status: Abnormal   Collection Time: 10/30/20  2:57 AM  Result Value Ref Range Status   Enterococcus faecalis NOT DETECTED NOT DETECTED Final   Enterococcus Faecium NOT DETECTED NOT DETECTED Final   Listeria monocytogenes NOT DETECTED NOT DETECTED Final   Staphylococcus species NOT DETECTED NOT DETECTED Final   Staphylococcus aureus (BCID) NOT DETECTED NOT DETECTED Final   Staphylococcus epidermidis NOT DETECTED NOT DETECTED Final   Staphylococcus lugdunensis NOT DETECTED NOT DETECTED Final   Streptococcus species DETECTED (Samentha Perham) NOT DETECTED Final    Comment: CRITICAL RESULT CALLED TO, READ BACK BY AND VERIFIED WITH: PHARMD FRANK W. [phone] FCP    Streptococcus agalactiae DETECTED (Tieasha Larsen) NOT DETECTED Final    Comment: CRITICAL RESULT CALLED TO, READ BACK BY AND VERIFIED WITH: PHARMD FRANK W. [phone] FCP     Streptococcus pneumoniae NOT DETECTED NOT DETECTED Final   Streptococcus pyogenes NOT DETECTED NOT DETECTED Final   Elanie Hammitt.calcoaceticus-baumannii NOT DETECTED NOT DETECTED Final   Bacteroides fragilis NOT DETECTED NOT DETECTED Final   Enterobacterales NOT DETECTED NOT DETECTED Final  Enterobacter cloacae complex NOT DETECTED NOT DETECTED Final   Escherichia coli NOT DETECTED NOT DETECTED Final   Klebsiella aerogenes NOT DETECTED NOT DETECTED Final   Klebsiella oxytoca NOT DETECTED NOT DETECTED Final   Klebsiella pneumoniae NOT DETECTED NOT DETECTED Final   Proteus species NOT DETECTED NOT DETECTED Final   Salmonella species NOT DETECTED NOT DETECTED Final   Serratia marcescens NOT DETECTED NOT DETECTED Final   Haemophilus influenzae NOT DETECTED NOT DETECTED Final   Neisseria meningitidis NOT DETECTED NOT DETECTED Final   Pseudomonas aeruginosa NOT DETECTED NOT DETECTED Final   Stenotrophomonas maltophilia NOT DETECTED NOT DETECTED Final   Candida albicans NOT DETECTED NOT DETECTED Final   Candida auris NOT DETECTED NOT DETECTED Final   Candida glabrata NOT DETECTED NOT DETECTED Final   Candida krusei NOT DETECTED NOT DETECTED Final   Candida parapsilosis NOT DETECTED NOT DETECTED Final   Candida tropicalis NOT DETECTED NOT DETECTED Final   Cryptococcus neoformans/gattii NOT DETECTED NOT DETECTED Final    Comment: Performed at [facility], [place] [address] [place], [place] [address]  Blood Culture (routine x 2)     Status: Abnormal   Collection Time: 10/30/20  3:50 AM   Specimen: BLOOD RIGHT FOREARM  Result Value Ref Range Status   Specimen Description BLOOD RIGHT FOREARM  Final   Special Requests   Final    BOTTLES DRAWN AEROBIC AND ANAEROBIC Blood Culture results may not be optimal due to an inadequate volume of blood received in culture bottles   Culture  Setup Time   Final    GRAM POSITIVE COCCI IN CHAINS IN BOTH AEROBIC AND ANAEROBIC BOTTLES    Culture (Samra Pesch)  Final    GROUP  B STREP(S.AGALACTIAE)ISOLATED SUSCEPTIBILITIES PERFORMED ON PREVIOUS CULTURE WITHIN THE LAST 5 DAYS. Performed at [facility], [place] [address] [place], [place] [address]    Report Status 11/01/2020 FINAL  Final  Resp Panel by RT-PCR (Flu Akeela Busk&B, Covid) Nasopharyngeal Swab     Status: None   Collection Time: 10/30/20  3:57 AM   Specimen: Nasopharyngeal Swab; Nasopharyngeal(NP) swabs in vial transport medium  Result Value Ref Range Status   SARS Coronavirus 2 by RT PCR NEGATIVE NEGATIVE Final    Comment: (NOTE) SARS-CoV-2 target nucleic acids are NOT DETECTED.  The SARS-CoV-2 RNA is generally detectable in upper respiratory specimens during the acute phase of infection. The lowest concentration of SARS-CoV-2 viral copies this assay can detect is 138 copies/mL. Jaben Benegas negative result does not preclude SARS-Cov-2 infection and should not be used as the sole basis for treatment or other patient management decisions. Chrystopher Stangl negative result may occur with  improper specimen collection/handling, submission of specimen other than nasopharyngeal swab, presence of viral mutation(s) within the areas targeted by this assay, and inadequate number of viral copies(<138 copies/mL). Nephi Savage negative result must be combined with clinical observations, patient history, and epidemiological information. The expected result is Negative.  Fact Sheet for Patients:  EntrepreneurPulse.com.au  Fact Sheet for Healthcare Providers:  IncredibleEmployment.be  This test is no t yet approved or cleared by the [place] FDA and  has been authorized for detection and/or diagnosis of SARS-CoV-2 by FDA under an Emergency Use Authorization (EUA). This EUA will remain  in effect (meaning this test can be used) for the duration of the COVID-19 declaration under Section 564(b)(1) of the Act, 21 U.S.C.section 360bbb-3(b)(1), unless the authorization is terminated  or revoked sooner.        Influenza Bence Trapp by  PCR NEGATIVE NEGATIVE Final   Influenza B by PCR NEGATIVE NEGATIVE Final    Comment: (NOTE) The Xpert Xpress SARS-CoV-2/FLU/RSV plus assay is intended as an aid in the diagnosis of influenza from Nasopharyngeal swab specimens and should not be used as Wilgus Deyton sole basis for treatment. Nasal washings and aspirates are unacceptable for Xpert Xpress SARS-CoV-2/FLU/RSV testing.  Fact Sheet for Patients: EntrepreneurPulse.com.au  Fact Sheet for Healthcare Providers: IncredibleEmployment.be  This test is not yet approved or cleared by the [place] FDA and has been authorized for detection and/or diagnosis of SARS-CoV-2 by FDA under an Emergency Use Authorization (EUA). This EUA will remain in effect (meaning this test can be used) for the duration of the COVID-19 declaration under Section 564(b)(1) of the Act, 21 U.S.C. section 360bbb-3(b)(1), unless the authorization is terminated or revoked.  Performed at [facility], [place] [address], [place], [place] [address]   Culture, blood (routine x 2)     Status: None (Preliminary result)   Collection Time: 10/31/20  8:19 PM   Specimen: BLOOD  Result Value Ref Range Status   Specimen Description BLOOD LEFT ANTECUBITAL  Final   Special Requests   Final    BOTTLES DRAWN AEROBIC AND ANAEROBIC Blood Culture adequate volume   Culture   Final    NO GROWTH 2 DAYS Performed at [facility], [place] [address], [place], [place] [address]    Report Status PENDING  Incomplete  Culture, blood (routine x 2)     Status: None (Preliminary result)   Collection Time: 10/31/20  8:22 PM   Specimen: BLOOD LEFT HAND  Result Value Ref Range Status   Specimen Description BLOOD LEFT HAND  Final   Special Requests   Final    BOTTLES DRAWN AEROBIC AND ANAEROBIC Blood Culture adequate volume   Culture   Final    NO GROWTH 2 DAYS Performed at [facility], [place] [address], [place], [place]  [address]    Report Status PENDING  Incomplete         Radiology Studies: ECHOCARDIOGRAM COMPLETE  Result Date: 11/01/2020    ECHOCARDIOGRAM REPORT   Patient Name:   TAYVON CULLEY Date of Exam: 11/01/2020 Medical Rec #:  [id number]   Height:       77.0 in Accession #:    [id number]  Weight:       380.0 lb Date of Birth:  11-09-1965   BSA:          2.938 m Patient Age:    47 years    BP:           133/69 mmHg Patient Gender: M           HR:           65 bpm. Exam Location:  Inpatient Procedure: 2D Echo, Cardiac Doppler and Color Doppler Indications:    Bacteremia  History:        Patient has no prior history of Echocardiogram examinations.                 CHF; Risk Factors:Diabetes, Hypertension and Dyslipidemia.  Sonographer:    Cammy Brochure Referring Phys: [phone] Callahan Peddie CALDWELL POWELL JR  Sonographer Comments: Image acquisition challenging due to patient body habitus. IMPRESSIONS  1. Left ventricular ejection fraction, by estimation, is 50 to 55%. The left ventricle has low normal function. The left ventricle has no regional wall motion abnormalities. There is mild left ventricular hypertrophy. Left ventricular diastolic  parameters are indeterminate. Elevated left ventricular end-diastolic pressure.  2. Right ventricular systolic function is normal. The right ventricular size is normal. Tricuspid regurgitation signal is inadequate for assessing PA pressure.  3. The mitral valve is normal in structure. Trivial mitral valve regurgitation. No evidence of mitral stenosis.  4. The aortic valve is grossly normal. Aortic valve regurgitation is not visualized. No aortic stenosis is present.  5. Aortic dilatation noted. There is mild dilatation of the aortic root, measuring 40 mm. Conclusion(s)/Recommendation(s): No evidence of valvular vegetations on this transthoracic echocardiogram. Would consider Joshawn Crissman transesophageal echocardiogram to exclude infective endocarditis if clinically indicated. FINDINGS  Left Ventricle: Left  ventricular ejection fraction, by estimation, is 50 to 55%. The left ventricle has low normal function. The left ventricle has no regional wall motion abnormalities. The left ventricular internal cavity size was normal in size. There is mild left ventricular hypertrophy. Left ventricular diastolic parameters are indeterminate. Elevated left ventricular end-diastolic pressure. Right Ventricle: The right ventricular size is normal. No increase in right ventricular wall thickness. Right ventricular systolic function is normal. Tricuspid regurgitation signal is inadequate for assessing PA pressure. Left Atrium: Left atrial size was normal in size. Right Atrium: Right atrial size was normal in size. Pericardium: Trivial pericardial effusion is present. Mitral Valve: The mitral valve is normal in structure. Trivial mitral valve regurgitation. No evidence of mitral valve stenosis. Tricuspid Valve: The tricuspid valve is normal in structure. Tricuspid valve regurgitation is trivial. No evidence of tricuspid stenosis. Aortic Valve: The aortic valve is grossly normal. Aortic valve regurgitation is not visualized. No aortic stenosis is present. Aortic valve mean gradient measures 5.0 mmHg. Aortic valve peak gradient measures 8.6 mmHg. Aortic valve area, by VTI measures 3.68 cm. Pulmonic Valve: The pulmonic valve was normal in structure. Pulmonic valve regurgitation is trivial. No evidence of pulmonic stenosis. Aorta: Aortic dilatation noted. There is mild dilatation of the aortic root, measuring 40 mm. IAS/Shunts: No atrial level shunt detected by color flow Doppler.  LEFT VENTRICLE PLAX 2D LVIDd:         5.00 cm  Diastology LVIDs:         3.50 cm  LV e' medial:    4.35 cm/s LV PW:         1.50 cm  LV E/e' medial:  26.2 LV IVS:        1.30 cm  LV e' lateral:   6.53 cm/s LVOT diam:     2.30 cm  LV E/e' lateral: 17.5 LV SV:         116 LV SV Index:   39 LVOT Area:     4.15 cm  RIGHT VENTRICLE RV Basal diam:  3.00 cm RV S  prime:     14.80 cm/s TAPSE (M-mode): 2.3 cm LEFT ATRIUM             Index       RIGHT ATRIUM           Index LA diam:        4.50 cm 1.53 cm/m  RA Area:     17.20 cm LA Vol (A2C):   65.5 ml 22.29 ml/m RA Volume:   45.90 ml  15.62 ml/m LA Vol (A4C):   78.9 ml 26.83 ml/m LA Biplane Vol: 67.9 ml 23.11 ml/m  AORTIC VALVE AV Area (Vmax):    3.42 cm AV Area (Vmean):   3.28 cm AV Area (VTI):     3.68 cm AV Vmax:  147.00 cm/s AV Vmean:          103.000 cm/s AV VTI:            0.315 m AV Peak Grad:      8.6 mmHg AV Mean Grad:      5.0 mmHg LVOT Vmax:         121.00 cm/s LVOT Vmean:        81.200 cm/s LVOT VTI:          0.279 m LVOT/AV VTI ratio: 0.89  AORTA Ao Root diam: 4.00 cm Ao Asc diam:  3.50 cm MITRAL VALVE MV Area (PHT): 5.02 cm     SHUNTS MV Decel Time: 151 msec     Systemic VTI:  0.28 m MV E velocity: 114.00 cm/s  Systemic Diam: 2.30 cm MV Damin Salido velocity: 109.00 cm/s MV E/Lexandra Rettke ratio:  1.05 Cherlynn Kaiser MD Electronically signed by Cherlynn Kaiser MD Signature Date/Time: 11/01/2020/4:06:37 PM    Final    [place] RT LOWER EXTREM LTD SOFT TISSUE NON VASCULAR  Result Date: 11/01/2020 CLINICAL DATA:  Puncture wound right foot, cellulitis EXAM: ULTRASOUND RIGHT LOWER EXTREMITY LIMITED TECHNIQUE: Ultrasound examination of the lower extremity soft tissues was performed in the area of clinical concern. COMPARISON:  10/30/2020 FINDINGS: Sonographic evaluation of the plantar aspect of the right forefoot was performed at the site of the puncture wound. There is Javante Nilsson 6 mm fluid collection deep to the puncture wound, located approximately 10 mm deep to the skin surface, with small tract extending toward the skin surface. Superficial subcutaneous edema is identified. I do not see any foreign body. IMPRESSION: 1. 6 mm fluid collection in the plantar right forefoot at the site of the puncture wound. This is located approximately 10 mm deep to the skin surface. 2. No evidence of foreign body. Electronically Signed   By:  Randa Ngo M.D.   On: 11/01/2020 23:47        Scheduled Meds: . carvedilol  25 mg Oral BID WC  . empagliflozin  25 mg Oral Daily  . guaiFENesin  600 mg Oral BID  . insulin aspart  0-20 Units Subcutaneous TID WC  . insulin aspart  0-5 Units Subcutaneous QHS  . insulin regular human CONCENTRATED  30 Units Subcutaneous TID WC  . isosorbide mononitrate  30 mg Oral Daily  . mupirocin ointment   Nasal BID  . pneumococcal 23 valent vaccine  0.5 mL Intramuscular Tomorrow-1000  . rivaroxaban  20 mg Oral Q supper  . Vitamin D (Ergocalciferol)  50,000 Units Oral Q Mon   Continuous Infusions: . pencillin G potassium IV 4 Million Units (11/02/20 1200)     LOS: 3 days    Time spent: over 30 min    Fayrene Helper, MD [facility]   To contact the attending provider between 7A-7P or the covering provider during after hours 7P-7A, please log into the web site [email or web address] and access using universal [place] password for that web site. If you do not have the password, please call the hospital operator.  11/02/2020, 4:20 PM

## 2020-11-02 NOTE — Progress Notes (Signed)
    [facility] has been requested to perform a transesophageal echocardiogram on Sean Valentine for bacteremia.  After careful review of history and examination, the risks and benefits of transesophageal echocardiogram have been explained including risks of esophageal damage, perforation (1:10,000 risk), bleeding, pharyngeal hematoma as well as other potential complications associated with conscious sedation including aspiration, arrhythmia, respiratory failure and death. Alternatives to treatment were discussed, questions were answered. Patient is willing to proceed.   Pt is scheduled tomorrow at 12:30 with Dr. Audie Box. NPO at [place] please.  Tami Lin Hyatt [place], [place]  11/02/2020 2:04 PM

## 2020-11-03 ENCOUNTER — Inpatient Hospital Stay (HOSPITAL_COMMUNITY): Payer: PRIVATE HEALTH INSURANCE | Admitting: Anesthesiology

## 2020-11-03 ENCOUNTER — Encounter (HOSPITAL_COMMUNITY)
Admission: EM | Disposition: A | Discharge status: Home / Self Care | Payer: Self-pay | Source: Home / Self Care | Attending: Family Medicine

## 2020-11-03 ENCOUNTER — Inpatient Hospital Stay (HOSPITAL_COMMUNITY): Payer: PRIVATE HEALTH INSURANCE

## 2020-11-03 ENCOUNTER — Other Ambulatory Visit (HOSPITAL_COMMUNITY): Payer: PRIVATE HEALTH INSURANCE

## 2020-11-03 ENCOUNTER — Encounter (HOSPITAL_COMMUNITY): Payer: Self-pay | Admitting: Internal Medicine

## 2020-11-03 DIAGNOSIS — N1832 Chronic kidney disease, stage 3b: Secondary | ICD-10-CM

## 2020-11-03 DIAGNOSIS — B951 Streptococcus, group B, as the cause of diseases classified elsewhere: Secondary | ICD-10-CM

## 2020-11-03 DIAGNOSIS — Z794 Long term (current) use of insulin: Secondary | ICD-10-CM

## 2020-11-03 DIAGNOSIS — E1122 Type 2 diabetes mellitus with diabetic chronic kidney disease: Secondary | ICD-10-CM | POA: Diagnosis not present

## 2020-11-03 DIAGNOSIS — A419 Sepsis, unspecified organism: Secondary | ICD-10-CM | POA: Diagnosis not present

## 2020-11-03 DIAGNOSIS — J45909 Unspecified asthma, uncomplicated: Secondary | ICD-10-CM

## 2020-11-03 DIAGNOSIS — L0390 Cellulitis, unspecified: Secondary | ICD-10-CM | POA: Diagnosis not present

## 2020-11-03 DIAGNOSIS — R7881 Bacteremia: Secondary | ICD-10-CM

## 2020-11-03 DIAGNOSIS — I10 Essential (primary) hypertension: Secondary | ICD-10-CM | POA: Diagnosis not present

## 2020-11-03 DIAGNOSIS — I313 Pericardial effusion (noninflammatory): Secondary | ICD-10-CM

## 2020-11-03 DIAGNOSIS — G4730 Sleep apnea, unspecified: Secondary | ICD-10-CM

## 2020-11-03 DIAGNOSIS — E782 Mixed hyperlipidemia: Secondary | ICD-10-CM

## 2020-11-03 DIAGNOSIS — K219 Gastro-esophageal reflux disease without esophagitis: Secondary | ICD-10-CM | POA: Diagnosis not present

## 2020-11-03 DIAGNOSIS — I87331 Chronic venous hypertension (idiopathic) with ulcer and inflammation of right lower extremity: Secondary | ICD-10-CM

## 2020-11-03 DIAGNOSIS — L97919 Non-pressure chronic ulcer of unspecified part of right lower leg with unspecified severity: Secondary | ICD-10-CM

## 2020-11-03 DIAGNOSIS — N183 Chronic kidney disease, stage 3 unspecified: Secondary | ICD-10-CM

## 2020-11-03 HISTORY — DX: Bacteremia: R78.81

## 2020-11-03 HISTORY — PX: TEE WITHOUT CARDIOVERSION: SHX5443

## 2020-11-03 HISTORY — PX: BUBBLE STUDY: SHX6837

## 2020-11-03 HISTORY — DX: Streptococcus, group b, as the cause of diseases classified elsewhere: B95.1

## 2020-11-03 LAB — CBC WITH DIFFERENTIAL/PLATELET
Abs Immature Granulocytes: 0.19 10*3/uL — ABNORMAL HIGH (ref 0.00–0.07)
Basophils Absolute: 0.1 10*3/uL (ref 0.0–0.1)
Basophils Relative: 1 %
Eosinophils Absolute: 0.3 10*3/uL (ref 0.0–0.5)
Eosinophils Relative: 5 %
HCT: 34.6 % — ABNORMAL LOW (ref 39.0–52.0)
Hemoglobin: 11.7 g/dL — ABNORMAL LOW (ref 13.0–17.0)
Immature Granulocytes: 3 %
Lymphocytes Relative: 27 %
Lymphs Abs: 1.8 10*3/uL (ref 0.7–4.0)
MCH: 32.3 pg (ref 26.0–34.0)
MCHC: 33.8 g/dL (ref 30.0–36.0)
MCV: 95.6 fL (ref 80.0–100.0)
Monocytes Absolute: 0.5 10*3/uL (ref 0.1–1.0)
Monocytes Relative: 8 %
Neutro Abs: 3.8 10*3/uL (ref 1.7–7.7)
Neutrophils Relative %: 56 %
Platelets: 201 10*3/uL (ref 150–400)
RBC: 3.62 MIL/uL — ABNORMAL LOW (ref 4.22–5.81)
RDW: 13.4 % (ref 11.5–15.5)
WBC: 6.6 10*3/uL (ref 4.0–10.5)
nRBC: 0 % (ref 0.0–0.2)

## 2020-11-03 LAB — ECHO TEE
AR max vel: 4.14 cm2
AV Area VTI: 4.4 cm2
AV Area mean vel: 4.25 cm2
AV Mean grad: 4.8 mmHg
AV Peak grad: 8.8 mmHg
Ao pk vel: 1.48 m/s

## 2020-11-03 LAB — COMPREHENSIVE METABOLIC PANEL
ALT: 22 U/L (ref 0–44)
AST: 17 U/L (ref 15–41)
Albumin: 2.5 g/dL — ABNORMAL LOW (ref 3.5–5.0)
Alkaline Phosphatase: 42 U/L (ref 38–126)
Anion gap: 9 (ref 5–15)
BUN: 33 mg/dL — ABNORMAL HIGH (ref 6–20)
CO2: 27 mmol/L (ref 22–32)
Calcium: 9 mg/dL (ref 8.9–10.3)
Chloride: 103 mmol/L (ref 98–111)
Creatinine, Ser: 2.11 mg/dL — ABNORMAL HIGH (ref 0.61–1.24)
GFR, Estimated: 36 mL/min — ABNORMAL LOW (ref >60)
Glucose, Bld: 199 mg/dL — ABNORMAL HIGH (ref 70–99)
Potassium: 3.8 mmol/L (ref 3.5–5.1)
Sodium: 139 mmol/L (ref 135–145)
Total Bilirubin: 0.5 mg/dL (ref 0.3–1.2)
Total Protein: 6.1 g/dL — ABNORMAL LOW (ref 6.5–8.1)

## 2020-11-03 LAB — PHOSPHORUS: Phosphorus: 3.5 mg/dL (ref 2.5–4.6)

## 2020-11-03 LAB — GLUCOSE, CAPILLARY: Glucose-Capillary: 204 mg/dL — ABNORMAL HIGH (ref 70–99)

## 2020-11-03 LAB — MAGNESIUM: Magnesium: 2 mg/dL (ref 1.7–2.4)

## 2020-11-03 SURGERY — ECHOCARDIOGRAM, TRANSESOPHAGEAL
Anesthesia: Monitor Anesthesia Care

## 2020-11-03 MED ORDER — PROPOFOL 10 MG/ML IV BOLUS
INTRAVENOUS | Status: DC | PRN
  Administered 2020-11-03: 50 mg via INTRAVENOUS

## 2020-11-03 MED ORDER — PROPOFOL 500 MG/50ML IV EMUL
INTRAVENOUS | Status: DC | PRN
  Administered 2020-11-03: 100 ug/kg/min via INTRAVENOUS

## 2020-11-03 MED ORDER — BUTAMBEN-TETRACAINE-BENZOCAINE 2-2-14 % EX AERO
INHALATION_SPRAY | CUTANEOUS | Status: DC | PRN
  Administered 2020-11-03: 2 via TOPICAL

## 2020-11-03 NOTE — Consult Note (Signed)
ORTHOPAEDIC CONSULTATION  REQUESTING PHYSICIAN: Elgergawy, Silver Huguenin, MD  Chief Complaint: Ulcerations right lower extremity.  HPI: Sean Valentine is a 55 y.o. male who presents with history of chronic venous stasis insufficiency ulcerations to the right lower extremity.  Patient states that recently he has been seen by his primary care physician and has been placed on 2 rounds of antibiotics.  Patient states he is scheduled for an appointment at the wound center in [facility].  He states he was hospitalized about 3 years ago for the same problem with ulcerations in his leg.  Past Medical History:  Diagnosis Date  . Anemia, chronic disease 05/26/2018   Formatting of this note might be different from the original. Eval with [facility] and phlebotomy for Hemochromotosis.  . Anticoagulant long-term use 11/26/2016  . Arthritis of right foot 05/08/2016  . Atypical chest pain 09/13/2020  . Benign hypertension with CKD (chronic kidney disease) stage III ([place]) 07/12/2019  . Bilateral leg edema   . Chest pain at rest 08/16/2020  . CHF (congestive heart failure) ([place]) 03/05/2012  . COVID-19 virus infection 02/09/2020  . Deformity of toe 08/01/2020  . Diabetic ulcer of toe of right foot associated with type 2 diabetes mellitus, with fat layer exposed ([place]) 01/10/2020   Formatting of this note might be different from the original. Distal tuft right fifth toe  . Dyslipidemia 03/05/2012  . Essential hypertension 11/26/2016  . GERD (gastroesophageal reflux disease) 08/01/2020  . Gouty arthritis of left hand 01/15/2017  . High risk medication use 11/26/2016  . History of COVID-19 05/05/2020   Formatting of this note might be different from the original. 02/2020  . History of pulmonary embolism 11/26/2016   Formatting of this note might be different from the original. Felt severe with permanent anticoagulation and Filter placed because of burden.  . Hypertension   . Localized edema 03/05/2012  . LVH (left ventricular  hypertrophy) 05/26/2018   Formatting of this note might be different from the original. 04/2018 EF 555-60%  . Malaise and fatigue 11/26/2016  . MGUS (monoclonal gammopathy of unknown significance) 06/03/2018   Formatting of this note might be different from the original. Possible. Seen on SPEP 05/2018  . Mild persistent asthma 11/26/2016  . Mixed hyperlipidemia 11/26/2016  . Morbid (severe) obesity due to excess calories ([place]) 03/05/2012  . Morbid obesity with body mass index (BMI) of 40.0 to 49.9 ([place]) 11/26/2016  . Nail dystrophy 05/20/2018  . Nephrolithiasis 03/05/2012  . Nonalcoholic steatohepatitis ([facility]) 05/26/2018  . Obesity, Class III, BMI 40-49.9 (morbid obesity) ([place]) 11/26/2016  . Other hemochromatosis 03/21/2020  . Posterior tibial tendinitis of right lower extremity 08/01/2020  . Pre-ulcerative calluses 06/18/2019  . Primary central sleep apnea 11/26/2016   Formatting of this note might be different from the original. bipap  . Sepsis ([place]) 10/30/2020  . Sepsis due to cellulitis ([place]) 10/30/2020  . Sleep apnea 03/05/2012   Formatting of this note might be different from the original. [place].  Marland Kitchen Spinal stenosis of lumbar region 05/26/2018  . Status post amputation of toe of right foot ([place]) 07/26/2015  . Subfibular impingement, right 03/13/2016  . Tinea pedis 08/01/2020  . Type 2 diabetes mellitus with stage 3b chronic kidney disease, with long-term current use of insulin ([place]) 07/12/2019   Formatting of this note might be different from the original. IMO 10/01 Updates  Formatting of this note might be different from the original. 2000   Past Surgical History:  Procedure Laterality Date  .  CHOLECYSTECTOMY    . CYSTOSCOPY W/ URETERAL STENT PLACEMENT Left 05/06/2005  . FOOT SURGERY Right    Big toe amputation  . IVC FILTER INSERTION  08/2016  . KIDNEY STONE SURGERY    . SHOULDER SURGERY Left    Social History   Socioeconomic History  . Marital status: Married    Spouse name: Not on  file  . Number of children: Not on file  . Years of education: Not on file  . Highest education level: Not on file  Occupational History  . Not on file  Tobacco Use  . Smoking status: Former Smoker    Years: 1.00    Quit date: 1986    Years since quitting: 36.4  . Smokeless tobacco: Never Used  Substance and Sexual Activity  . Alcohol use: Not on file  . Drug use: Not on file  . Sexual activity: Not on file  Other Topics Concern  . Not on file  Social History Narrative  . Not on file   Social Determinants of Health   Financial Resource Strain: Not on file  Food Insecurity: Not on file  Transportation Needs: Not on file  Physical Activity: Not on file  Stress: Not on file  Social Connections: Not on file   Family History  Problem Relation Age of Onset  . Cancer Mother   . Clotting disorder Mother   . Stroke Mother   . Alzheimer's disease Mother   . Diabetes Mother   . Diabetes Father    - negative except otherwise stated in the family history section No Known Allergies Prior to Admission medications   Medication Sig Start Date End Date Taking? Authorizing Provider  amLODipine (NORVASC) 5 MG tablet Take 5 mg by mouth daily as needed (high blood pressure). 09/07/17  Yes [provider]  carvedilol (COREG) 25 MG tablet Take 25 mg by mouth 2 (two) times daily with a meal. 07/12/19  Yes [provider]  empagliflozin (JARDIANCE) 25 MG TABS tablet Take 25 mg by mouth daily. 03/24/19  Yes [provider]  ergocalciferol (VITAMIN D2) 1.25 MG (50000 UT) capsule Take 1 capsule by mouth every Monday. 07/13/20  Yes [provider]  insulin aspart (FIASP FLEXTOUCH) 100 UNIT/ML FlexTouch Pen Inject 20 Units into the skin 3 (three) times daily. Inject 20 units into the skin before each meal 03/21/20  Yes [provider]  insulin regular human CONCENTRATED (HUMULIN R) 500 UNIT/ML injection Inject 30 Units into the skin 3 (three) times daily with  meals. 11/29/17  Yes [provider]  isosorbide mononitrate (IMDUR) 30 MG 24 hr tablet Take 1 tablet (30 mg total) by mouth daily. 09/13/20  Yes Park Liter, MD  lisinopril-hydrochlorothiazide (ZESTORETIC) 20-12.5 MG tablet Take 1 tablet by mouth daily. 03/13/17  Yes [provider]  nitroGLYCERIN (NITROSTAT) 0.4 MG SL tablet Place 1 tablet (0.4 mg total) under the tongue every 5 (five) minutes as needed for chest pain. 09/13/20 12/12/20 Yes Park Liter, MD  rivaroxaban (XARELTO) 20 MG TABS tablet Take 20 mg by mouth daily with supper.   Yes [provider]  torsemide (DEMADEX) 20 MG tablet Take 20 mg by mouth daily as needed (for swelling). 06/01/20  Yes [provider]  vitamin B-12 (CYANOCOBALAMIN) 1000 MCG tablet Take 1,000 mcg by mouth every Monday.   Yes [provider]  ciprofloxacin (CIPRO) 500 MG tablet Take 500 mg by mouth 2 (two) times daily. Patient not taking: No sig reported  [provider]   [place] RT LOWER EXTREM LTD SOFT TISSUE NON VASCULAR  Result Date: 11/01/2020 CLINICAL DATA:  Puncture wound right foot, cellulitis EXAM: ULTRASOUND RIGHT LOWER EXTREMITY LIMITED TECHNIQUE: Ultrasound examination of the lower extremity soft tissues was performed in the area of clinical concern. COMPARISON:  10/30/2020 FINDINGS: Sonographic evaluation of the plantar aspect of the right forefoot was performed at the site of the puncture wound. There is a 6 mm fluid collection deep to the puncture wound, located approximately 10 mm deep to the skin surface, with small tract extending toward the skin surface. Superficial subcutaneous edema is identified. I do not see any foreign body. IMPRESSION: 1. 6 mm fluid collection in the plantar right forefoot at the site of the puncture wound. This is located approximately 10 mm deep to the skin surface. 2. No evidence of foreign body. Electronically Signed   By: Randa Ngo M.D.   On: 11/01/2020  23:47   ECHO TEE  Result Date: 11/03/2020    TRANSESOPHOGEAL ECHO REPORT   Patient Name:   Sean Valentine Date of Exam: 11/03/2020 Medical Rec #:  [id number]   Height:       77.0 in Accession #:    [id number]  Weight:       380.0 lb Date of Birth:  Nov 08, 1965   BSA:          2.938 m Patient Age:    72 years    BP:           168/84 mmHg Patient Gender: M           HR:           79 bpm. Exam Location:  Inpatient Procedure: 2D Echo, Cardiac Doppler and Color Doppler Indications:     Bacteremia  History:         Patient has prior history of Echocardiogram examinations, most                  recent 11/01/2020. CHF; Risk Factors:Hypertension, Diabetes and                  Dyslipidemia.  Sonographer:     Clayton Lefort RDCS (AE) Referring Phys:  [id number] Tami Lin [facility] Diagnosing Phys: Eleonore Chiquito MD PROCEDURE: After discussion of the risks and benefits of a TEE, an informed consent was obtained from the patient. TEE procedure time was 14 minutes. The transesophogeal probe was passed without difficulty through the esophogus of the patient. Imaged were obtained with the patient in a left lateral decubitus position. Local oropharyngeal anesthetic was provided with Cetacaine. Sedation performed by different physician. The patient was monitored while under deep sedation. Anesthestetic sedation was provided intravenously by Anesthesiology: 360mg  of Propofol. Image quality was excellent. The patient's vital signs; including heart rate, blood pressure, and oxygen saturation; remained stable throughout the procedure. The patient developed no complications during the procedure. IMPRESSIONS  1. No evidence of endocarditis.  2. Left ventricular ejection fraction, by estimation, is 60 to 65%. The left ventricle has normal function.  3. Right ventricular systolic function is normal. The right ventricular size is normal.  4. No left atrial/left atrial appendage thrombus was detected. The LAA emptying velocity was 62 cm/s.  5. 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 Bubble study negative for PFO. Agitated saline contrast bubble study was negative, with no evidence of any interatrial shunt.  6. A small pericardial effusion is present. The pericardial effusion is circumferential.  7.  The mitral valve is normal in structure. Trivial mitral valve regurgitation. No evidence of mitral stenosis.  8. The aortic valve is tricuspid. Aortic valve regurgitation is not visualized. No aortic stenosis is present. Conclusion(s)/Recommendation(s): No evidence of vegetation/infective endocarditis on this transesophageal echocardiogram. FINDINGS  Left Ventricle: Left ventricular ejection fraction, by estimation, is 60 to 65%. The left ventricle has normal function. The left ventricular internal cavity size was normal in size. Right Ventricle: The right ventricular size is normal. No increase in right ventricular wall thickness. Right ventricular systolic function is normal. Left Atrium: Left atrial size was normal in size. No left atrial/left atrial appendage thrombus was detected. The LAA emptying velocity was 62 cm/s. Right Atrium: Right atrial size was normal in size. Pericardium: A small pericardial effusion is present. The pericardial effusion is circumferential. Mitral Valve: The mitral valve is normal in structure. Trivial mitral valve regurgitation. No evidence of mitral valve stenosis. There is no evidence of mitral valve vegetation. Tricuspid Valve: The tricuspid valve is normal in structure. Tricuspid valve regurgitation is trivial. No evidence of tricuspid stenosis. There is no evidence of tricuspid valve vegetation. Aortic Valve: The aortic valve is tricuspid. Aortic valve regurgitation is not visualized. No aortic stenosis is present. Aortic valve mean gradient measures 4.8 mmHg. Aortic valve peak gradient measures 8.8 mmHg. Aortic valve area, by VTI measures 4.40 cm. There is no evidence of aortic valve vegetation. Pulmonic  Valve: The pulmonic valve was normal in structure. Pulmonic valve regurgitation is trivial. No evidence of pulmonic stenosis. There is no evidence of pulmonic valve vegetation. Aorta: The aortic root and ascending aorta are structurally normal, with no evidence of dilitation. There is minimal (Grade I) layered plaque involving the descending aorta. Venous: The left upper pulmonary vein, left lower pulmonary vein, right upper pulmonary vein and right lower pulmonary vein are normal. IAS/Shunts: The interatrial septum is aneurysmal. The atrial septum is grossly normal. Agitated saline contrast was given intravenously to evaluate for intracardiac shunting. Agitated saline contrast bubble study was negative, with no evidence of any interatrial shunt. 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 Bubble study negative for PFO.  LEFT VENTRICLE PLAX 2D LVOT diam:     2.50 cm LV SV:         137 LV SV Index:   47 LVOT Area:     4.91 cm  AORTIC VALVE AV Area (Vmax):    4.14 cm AV Area (Vmean):   4.25 cm AV Area (VTI):     4.40 cm AV Vmax:           148.08 cm/s AV Vmean:          103.139 cm/s AV VTI:            0.310 m AV Peak Grad:      8.8 mmHg AV Mean Grad:      4.8 mmHg LVOT Vmax:         124.89 cm/s LVOT Vmean:        89.375 cm/s LVOT VTI:          0.279 m LVOT/AV VTI ratio: 0.90  AORTA Ao Root diam: 3.56 cm Ao Asc diam:  3.30 cm TRICUSPID VALVE TR Peak grad:   3.5 mmHg TR Vmax:        94.00 cm/s  SHUNTS Systemic VTI:  0.28 m Systemic Diam: 2.50 cm Eleonore Chiquito MD Electronically signed by Eleonore Chiquito MD Signature Date/Time: 11/03/2020/2:41:03 PM    Final (Updated)    -  pertinent xrays, CT, MRI studies were reviewed and independently interpreted  Positive ROS: All other systems have been reviewed and were otherwise negative with the exception of those mentioned in the HPI and as above.  Physical Exam: General: Alert, no acute distress Psychiatric: Patient is competent for consent with normal mood  and affect Lymphatic: No axillary or cervical lymphadenopathy Cardiovascular: No pedal edema Respiratory: No cyanosis, no use of accessory musculature GI: No organomegaly, abdomen is soft and non-tender    Images:  @ENCIMAGES @  Labs:  Lab Results  Component Value Date   HGBA1C 10.5 (H) 11/01/2020   REPTSTATUS PENDING 10/31/2020   CULT  10/31/2020    NO GROWTH 3 DAYS Performed at [facility] [address], [place], [place] [address]    LABORGA GROUP B STREP(S.AGALACTIAE)ISOLATED 10/30/2020    Lab Results  Component Value Date   ALBUMIN 2.5 (L) 11/03/2020   ALBUMIN 2.4 (L) 11/02/2020   ALBUMIN 2.5 (L) 10/31/2020     CBC EXTENDED Latest Ref Rng & Units 11/03/2020 11/02/2020 11/01/2020  WBC 4.0 - 10.5 K/uL 6.6 7.2 7.6  RBC 4.22 - 5.81 MIL/uL 3.62(L) 3.59(L) 3.57(L)  HGB 13.0 - 17.0 g/dL 11.7(L) 11.8(L) 11.7(L)  HCT 39.0 - 52.0 % 34.6(L) 34.0(L) 34.2(L)  PLT 150 - 400 K/uL 201 187 165  NEUTROABS 1.7 - 7.7 K/uL 3.8 4.2 4.2  LYMPHSABS 0.7 - 4.0 K/uL 1.8 1.9 2.0    Neurologic: Patient does not have protective sensation bilateral lower extremities.   MUSCULOSKELETAL:   Skin: Examination patient has dermatitis in the gaiter area of both legs worse on the right than the left.  He has 3 venous stasis ulcers of the right leg as well as a plantar ulcer on the right.  There is no cellulitis no crepitation to palpation of the soft tissue.  There is pitting edema.  Patient has a strongly palpable dorsalis pedis pulse.  Assessment: Venous stasis insufficiency ulceration right lower extremity.  Plan: Plan: I have written orders for patient to wear a Vive wear 20 to 30 mm compression stocking around-the-clock, change daily.  Sock to be provided by [employer] [facility] clinic.  I will reevaluate the patient tomorrow and anticipate if patient's leg is improving he could be discharged at that time.  Thank you for the consult and the opportunity to see Mr. Ria Comment, MD [employer] [phone] 4:04 PM

## 2020-11-03 NOTE — Anesthesia Procedure Notes (Signed)
Procedure Name: MAC Date/Time: 11/03/2020 12:30 PM Performed by: Lowella Dell, CRNA Pre-anesthesia Checklist: Patient identified, Emergency Drugs available, Suction available, Patient being monitored and Timeout performed Patient Re-evaluated:Patient Re-evaluated prior to induction Oxygen Delivery Method: Nasal cannula Placement Confirmation: positive ETCO2 Dental Injury: Teeth and Oropharynx as per pre-operative assessment

## 2020-11-03 NOTE — CV Procedure (Signed)
    TRANSESOPHAGEAL ECHOCARDIOGRAM   NAME:  Kaynan Klonowski    MRN: [id number] DOB:  [birth year]    ADMIT DATE: 10/30/2020  INDICATIONS: Bacteremia   PROCEDURE:   Informed consent was obtained prior to the procedure. The risks, benefits and alternatives for the procedure were discussed and the patient comprehended these risks.  Risks include, but are not limited to, cough, sore throat, vomiting, nausea, somnolence, esophageal and stomach trauma or perforation, bleeding, low blood pressure, aspiration, pneumonia, infection, trauma to the teeth and death.    Procedural time out performed. The oropharynx was anesthetized with topical 1% benzocaine.    Anesthesia was administered by Dr. Gloris Manchester.  The patient was administered 360 mg of propofol and 0 mg of lidocaine to achieve and maintain moderate conscious sedation.  The patient's heart rate, blood pressure, and oxygen saturation are monitored continuously during the procedure. The period of conscious sedation is 14 minutes, of which I was present face-to-face 100% of this time.   The transesophageal probe was inserted in the esophagus and stomach without difficulty and multiple views were obtained.   COMPLICATIONS:    There were no immediate complications.  KEY FINDINGS:  1. No endocarditis.  2. 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  3. Normal LV/RV function.   4. Full report to follow. 5. Further management per primary team.   Addison Naegeli. Audie Box, MD, [place]  [address], [place], [place] [address] [phone]  12:45 PM

## 2020-11-03 NOTE — Progress Notes (Addendum)
I have seen and examined the patient. I have personally reviewed the clinical findings, laboratory findings, microbiological data and imaging studies. The assessment and treatment plan was discussed with the  Advance Practice Provider, Mauricio Po  I agree with her/his recommendations except following additions/corrections.   Group B strep Bacteremia - likely source is lower extremity chronic swelling.  TEE findings negative for vegetations  Follow up blood cultures negative in 3 days   Afebrile, no leukocytosis, hemodynamically stable   [place] RT Lower extremity : 6 mm fluid collection in the plantar right forefoot at the site of the puncture wound. This is located approximately 10 mm deep to the skin surface.  Continue Penicillin for now pending Ortho plans for rt foot wound. If no intervention planned, would switch to Amoxicillin ( renal dosing) to complete 14 days course total  Elevate legs Follow up with Wound care [facility]   Dr Juleen China is on call this weekend and will follow up on Ortho recommendations   Rosiland Oz, [place] [facility]ious Disease  Date of Admission:  10/30/2020     Total days of antibiotics 6         ASSESSMENT:  Mr. Sean Valentine blood cultures from 5/31 have remained without growth to date and TTE is without evidence of vegetation. Planned TEE this afternoon. If TEE is negative will consider oral treatment with amoxicillin. Continue with Penicillin for now. Continue wound care for per [facility] RN recommendations. Does not appear to need any interventions for plantar aspect of foot.   PLAN:  1. Continue Penicillin. 2. Await TEE  3. Wound care per [facility] RN recommendations.   Principal Problem:   Sepsis due to cellulitis [facility]) Active Problems:   Bacteremia due to group B Streptococcus   Essential hypertension   GERD (gastroesophageal reflux disease)   Mild persistent  asthma   Mixed hyperlipidemia   Type 2 diabetes mellitus with stage 3b chronic kidney disease, with long-term current use of insulin (HCC)   Obesity, Class III, BMI 40-49.9 (morbid obesity) ([place])   Sleep apnea   Sepsis ([place])   . carvedilol  25 mg Oral BID WC  . empagliflozin  25 mg Oral Daily  . guaiFENesin  600 mg Oral BID  . insulin aspart  0-20 Units Subcutaneous TID WC  . insulin aspart  0-5 Units Subcutaneous QHS  . insulin regular human CONCENTRATED  30 Units Subcutaneous TID WC  . isosorbide mononitrate  30 mg Oral Daily  . mupirocin ointment   Nasal BID  . pneumococcal 23 valent vaccine  0.5 mL Intramuscular Tomorrow-1000  . rivaroxaban  20 mg Oral Q supper  . Vitamin D (Ergocalciferol)  50,000 Units Oral Q Mon    SUBJECTIVE: Afebrile overnight with no acute events. TTE without vegetation and scheduled for TEE today. Blood cultures from 5/31 have been without growth to date. Feeling better today.   No Known Allergies   Review of Systems: Review of Systems  Constitutional: Negative for chills, fever and weight loss.  Respiratory: Negative for cough, shortness of breath and wheezing.   Cardiovascular: Negative for chest pain and leg swelling.  Gastrointestinal: Negative for abdominal pain, constipation, diarrhea, nausea and vomiting.  Skin: Negative for rash.      OBJECTIVE: Vitals:   11/02/20 1305 11/02/20 2025 11/03/20 0055 11/03/20 0351  BP: 135/69 (!) 146/73  (!) 147/59  Pulse: 72 73 68 65  Resp:  17 16 17 18   Temp: 98.1 F (36.7 C) 98.3 F (36.8 C)  97.7 F (36.5 C)  TempSrc: Oral Oral  Oral  SpO2: 94% 95% 98% 98%  Weight:      Height:       Body mass index is 45.06 kg/m.  Physical Exam Constitutional:      General: He is not in acute distress.    Appearance: He is well-developed.  Cardiovascular:     Rate and Rhythm: Normal rate and regular rhythm.     Heart sounds: Normal heart sounds.  Pulmonary:     Effort: Pulmonary effort is normal.      Breath sounds: Normal breath sounds.  Skin:    General: Skin is warm and dry.     Comments: Wounds on right lower extremity are wrapped with no drainage on dressing. Appear to be healing adequately.   Neurological:     Mental Status: He is alert and oriented to person, place, and time.  Psychiatric:        Behavior: Behavior normal.        Thought Content: Thought content normal.        Judgment: Judgment normal.     Lab Results Lab Results  Component Value Date   WBC 6.6 11/03/2020   HGB 11.7 (L) 11/03/2020   HCT 34.6 (L) 11/03/2020   MCV 95.6 11/03/2020   PLT 201 11/03/2020    Lab Results  Component Value Date   CREATININE 2.11 (H) 11/03/2020   BUN 33 (H) 11/03/2020   NA 139 11/03/2020   K 3.8 11/03/2020   CL 103 11/03/2020   CO2 27 11/03/2020    Lab Results  Component Value Date   ALT 22 11/03/2020   AST 17 11/03/2020   ALKPHOS 42 11/03/2020   BILITOT 0.5 11/03/2020     Microbiology: Recent Results (from the past 240 hour(s))  Blood Culture (routine x 2)     Status: Abnormal   Collection Time: 10/30/20  2:57 AM   Specimen: BLOOD  Result Value Ref Range Status   Specimen Description BLOOD RIGHT ANTECUBITAL  Final   Special Requests   Final    BOTTLES DRAWN AEROBIC AND ANAEROBIC Blood Culture adequate volume   Culture  Setup Time   Final    GRAM POSITIVE COCCI IN CHAINS IN BOTH AEROBIC AND ANAEROBIC BOTTLES CRITICAL RESULT CALLED TO, READ BACK BY AND VERIFIED WITH: Lafe Garin [id number] FCP Performed at [facility], [address] [place], [place] [address]    Culture GROUP B STREP(S.AGALACTIAE)ISOLATED (A)  Final   Report Status 11/01/2020 FINAL  Final   Organism ID, Bacteria GROUP B STREP(S.AGALACTIAE)ISOLATED  Final      Susceptibility   Group b strep(s.agalactiae)isolated - MIC*    CLINDAMYCIN >=1 RESISTANT Resistant     AMPICILLIN <=0.25 SENSITIVE Sensitive     ERYTHROMYCIN >=8 RESISTANT Resistant     VANCOMYCIN 0.5 SENSITIVE Sensitive      CEFTRIAXONE <=0.12 SENSITIVE Sensitive     LEVOFLOXACIN 1 SENSITIVE Sensitive     * GROUP B STREP(S.AGALACTIAE)ISOLATED  Blood Culture ID Panel (Reflexed)     Status: Abnormal   Collection Time: 10/30/20  2:57 AM  Result Value Ref Range Status   Enterococcus faecalis NOT DETECTED NOT DETECTED Final   Enterococcus Faecium NOT DETECTED NOT DETECTED Final   Listeria monocytogenes NOT DETECTED NOT DETECTED Final   Staphylococcus species NOT DETECTED NOT DETECTED Final   Staphylococcus aureus (BCID) NOT  DETECTED NOT DETECTED Final   Staphylococcus epidermidis NOT DETECTED NOT DETECTED Final   Staphylococcus lugdunensis NOT DETECTED NOT DETECTED Final   Streptococcus species DETECTED (A) NOT DETECTED Final    Comment: CRITICAL RESULT CALLED TO, READ BACK BY AND VERIFIED WITH: PHARMD Kathreen Cosier. [id number] FCP    Streptococcus agalactiae DETECTED (A) NOT DETECTED Final    Comment: CRITICAL RESULT CALLED TO, READ BACK BY AND VERIFIED WITH: PHARMD FRANK W. [id number] FCP    Streptococcus pneumoniae NOT DETECTED NOT DETECTED Final   Streptococcus pyogenes NOT DETECTED NOT DETECTED Final   A.calcoaceticus-baumannii NOT DETECTED NOT DETECTED Final   Bacteroides fragilis NOT DETECTED NOT DETECTED Final   Enterobacterales NOT DETECTED NOT DETECTED Final   Enterobacter cloacae complex NOT DETECTED NOT DETECTED Final   Escherichia coli NOT DETECTED NOT DETECTED Final   Klebsiella aerogenes NOT DETECTED NOT DETECTED Final   Klebsiella oxytoca NOT DETECTED NOT DETECTED Final   Klebsiella pneumoniae NOT DETECTED NOT DETECTED Final   Proteus species NOT DETECTED NOT DETECTED Final   Salmonella species NOT DETECTED NOT DETECTED Final   Serratia marcescens NOT DETECTED NOT DETECTED Final   Haemophilus influenzae NOT DETECTED NOT DETECTED Final   Neisseria meningitidis NOT DETECTED NOT DETECTED Final   Pseudomonas aeruginosa NOT DETECTED NOT DETECTED Final   Stenotrophomonas maltophilia NOT  DETECTED NOT DETECTED Final   Candida albicans NOT DETECTED NOT DETECTED Final   Candida auris NOT DETECTED NOT DETECTED Final   Candida glabrata NOT DETECTED NOT DETECTED Final   Candida krusei NOT DETECTED NOT DETECTED Final   Candida parapsilosis NOT DETECTED NOT DETECTED Final   Candida tropicalis NOT DETECTED NOT DETECTED Final   Cryptococcus neoformans/gattii NOT DETECTED NOT DETECTED Final    Comment: Performed at [facility], [address] [place], [place] [address]  Blood Culture (routine x 2)     Status: Abnormal   Collection Time: 10/30/20  3:50 AM   Specimen: BLOOD RIGHT FOREARM  Result Value Ref Range Status   Specimen Description BLOOD RIGHT FOREARM  Final   Special Requests   Final    BOTTLES DRAWN AEROBIC AND ANAEROBIC Blood Culture results may not be optimal due to an inadequate volume of blood received in culture bottles   Culture  Setup Time   Final    GRAM POSITIVE COCCI IN CHAINS IN BOTH AEROBIC AND ANAEROBIC BOTTLES    Culture (A)  Final    GROUP B STREP(S.AGALACTIAE)ISOLATED SUSCEPTIBILITIES PERFORMED ON PREVIOUS CULTURE WITHIN THE LAST 5 DAYS. Performed at [facility], [place] [address] [place], [place] [address]    Report Status 11/01/2020 FINAL  Final  Resp Panel by RT-PCR (Flu A&B, Covid) Nasopharyngeal Swab     Status: None   Collection Time: 10/30/20  3:57 AM   Specimen: Nasopharyngeal Swab; Nasopharyngeal(NP) swabs in vial transport medium  Result Value Ref Range Status   SARS Coronavirus 2 by RT PCR NEGATIVE NEGATIVE Final    Comment: (NOTE) SARS-CoV-2 target nucleic acids are NOT DETECTED.  The SARS-CoV-2 RNA is generally detectable in upper respiratory specimens during the acute phase of infection. The lowest concentration of SARS-CoV-2 viral copies this assay can detect is 138 copies/mL. A negative result does not preclude SARS-Cov-2 infection and should not be used as the sole basis for treatment or other patient management  decisions. A negative result may occur with  improper specimen collection/handling, submission of specimen other than nasopharyngeal swab, presence of viral mutation(s) within the  areas targeted by this assay, and inadequate number of viral copies(<138 copies/mL). A negative result must be combined with clinical observations, patient history, and epidemiological information. The expected result is Negative.  Fact Sheet for Patients:  EntrepreneurPulse.com.au  Fact Sheet for Healthcare Providers:  IncredibleEmployment.be  This test is no t yet approved or cleared by the [place] FDA and  has been authorized for detection and/or diagnosis of SARS-CoV-2 by FDA under an Emergency Use Authorization (EUA). This EUA will remain  in effect (meaning this test can be used) for the duration of the COVID-19 declaration under Section 564(b)(1) of the Act, 21 U.S.C.section 360bbb-3(b)(1), unless the authorization is terminated  or revoked sooner.       Influenza A by PCR NEGATIVE NEGATIVE Final   Influenza B by PCR NEGATIVE NEGATIVE Final    Comment: (NOTE) The Xpert Xpress SARS-CoV-2/FLU/RSV plus assay is intended as an aid in the diagnosis of influenza from Nasopharyngeal swab specimens and should not be used as a sole basis for treatment. Nasal washings and aspirates are unacceptable for Xpert Xpress SARS-CoV-2/FLU/RSV testing.  Fact Sheet for Patients: EntrepreneurPulse.com.au  Fact Sheet for Healthcare Providers: IncredibleEmployment.be  This test is not yet approved or cleared by the [place] FDA and has been authorized for detection and/or diagnosis of SARS-CoV-2 by FDA under an Emergency Use Authorization (EUA). This EUA will remain in effect (meaning this test can be used) for the duration of the COVID-19 declaration under Section 564(b)(1) of the Act, 21 U.S.C. section 360bbb-3(b)(1), unless the  authorization is terminated or revoked.  Performed at [facility], [place] [address] [place], [place] [address]   Culture, blood (routine x 2)     Status: None (Preliminary result)   Collection Time: 10/31/20  8:19 PM   Specimen: BLOOD  Result Value Ref Range Status   Specimen Description BLOOD LEFT ANTECUBITAL  Final   Special Requests   Final    BOTTLES DRAWN AEROBIC AND ANAEROBIC Blood Culture adequate volume   Culture   Final    NO GROWTH 3 DAYS Performed at [facility], [place] [address], [place], [place] [address]    Report Status PENDING  Incomplete  Culture, blood (routine x 2)     Status: None (Preliminary result)   Collection Time: 10/31/20  8:22 PM   Specimen: BLOOD LEFT HAND  Result Value Ref Range Status   Specimen Description BLOOD LEFT HAND  Final   Special Requests   Final    BOTTLES DRAWN AEROBIC AND ANAEROBIC Blood Culture adequate volume   Culture   Final    NO GROWTH 3 DAYS Performed at [facility], [place] [address], [place], [place] [address]    Report Status PENDING  Incomplete   TEE 11/03/20 1. No evidence of endocarditis. 2. Left ventricular ejection fraction, by estimation, is 60 to 65%. The left ventricle has normal function. 3. Right ventricular systolic function is normal. The right ventricular size is normal. 4. No left atrial/left atrial appendage thrombus was detected. The LAA emptying velocity was 62 cm/s. 5. 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 Bubble study negative for PFO. Agitated saline contrast bubble study was negative, with no evidence of any interatrial shunt. 6. A small pericardial effusion is present. The pericardial effusion is circumferential. 7. The mitral valve is normal in structure. Trivial mitral valve regurgitation. No evidence of mitral stenosis. 8. The aortic valve is tricuspid. Aortic valve regurgitation is not visualized. No  aortic stenosis  is present. Conclusion(s)/Recommendation(s): No evidence of vegetation/infective endocarditis on this transesophageal echocardiogram.   Terri Piedra, [place] [facility] [place] [facility]  11/03/2020  11:45 AM

## 2020-11-03 NOTE — Consult Note (Signed)
Reason for Consult:RLE wounds Referring Physician: Phillips Climes Time called: 1121 Time at bedside: Sean Valentine is an 55 y.o. male.  HPI: Thelma was admitted several days ago with sepsis. It was thought to be 2/2 cellulitis of the right foot and possibly involving some chronic ulceration of the right leg. His acute syndrome has resolved and orthopedic surgery was consulted to assess his wounds as well of an [place] finding of a relatively deep foot fluid collection. He notes the leg ulcers have been present for about 6 weeks and wax and wane. He has been on 3 rounds of abx with no apparent benefit. He has been referred to the wound care center and his appointment is upcoming. The foot wound is only about a week old. It still hurts when he bears weight but doesn't hurt at rest anymore.  Past Medical History:  Diagnosis Date  . Anemia, chronic disease 05/26/2018   Formatting of this note might be different from the original. Eval with [facility] and phlebotomy for Hemochromotosis.  . Anticoagulant long-term use 11/26/2016  . Arthritis of right foot 05/08/2016  . Atypical chest pain 09/13/2020  . Benign hypertension with CKD (chronic kidney disease) stage III ([place]) 07/12/2019  . Bilateral leg edema   . Chest pain at rest 08/16/2020  . CHF (congestive heart failure) ([place]) 03/05/2012  . COVID-19 virus infection 02/09/2020  . Deformity of toe 08/01/2020  . Diabetic ulcer of toe of right foot associated with type 2 diabetes mellitus, with fat layer exposed ([place]) 01/10/2020   Formatting of this note might be different from the original. Distal tuft right fifth toe  . Dyslipidemia 03/05/2012  . Essential hypertension 11/26/2016  . GERD (gastroesophageal reflux disease) 08/01/2020  . Gouty arthritis of left hand 01/15/2017  . High risk medication use 11/26/2016  . History of COVID-19 05/05/2020   Formatting of this note might be different from the original. 02/2020  . History of pulmonary embolism 11/26/2016    Formatting of this note might be different from the original. Felt severe with permanent anticoagulation and Filter placed because of burden.  . Hypertension   . Localized edema 03/05/2012  . LVH (left ventricular hypertrophy) 05/26/2018   Formatting of this note might be different from the original. 04/2018 EF 555-60%  . Malaise and fatigue 11/26/2016  . MGUS (monoclonal gammopathy of unknown significance) 06/03/2018   Formatting of this note might be different from the original. Possible. Seen on SPEP 05/2018  . Mild persistent asthma 11/26/2016  . Mixed hyperlipidemia 11/26/2016  . Morbid (severe) obesity due to excess calories ([place]) 03/05/2012  . Morbid obesity with body mass index (BMI) of 40.0 to 49.9 ([place]) 11/26/2016  . Nail dystrophy 05/20/2018  . Nephrolithiasis 03/05/2012  . Nonalcoholic steatohepatitis ([facility]) 05/26/2018  . Obesity, Class III, BMI 40-49.9 (morbid obesity) ([place]) 11/26/2016  . Other hemochromatosis 03/21/2020  . Posterior tibial tendinitis of right lower extremity 08/01/2020  . Pre-ulcerative calluses 06/18/2019  . Primary central sleep apnea 11/26/2016   Formatting of this note might be different from the original. bipap  . Sepsis ([place]) 10/30/2020  . Sepsis due to cellulitis ([place]) 10/30/2020  . Sleep apnea 03/05/2012   Formatting of this note might be different from the original. [place].  Marland Kitchen Spinal stenosis of lumbar region 05/26/2018  . Status post amputation of toe of right foot ([place]) 07/26/2015  . Subfibular impingement, right 03/13/2016  . Tinea pedis 08/01/2020  . Type 2 diabetes mellitus with stage 3b chronic  kidney disease, with long-term current use of insulin ([place]) 07/12/2019   Formatting of this note might be different from the original. IMO 10/01 Updates  Formatting of this note might be different from the original. 2000    Past Surgical History:  Procedure Laterality Date  . CHOLECYSTECTOMY    . CYSTOSCOPY W/ URETERAL STENT PLACEMENT Left 05/06/2005  . FOOT SURGERY  Right    Big toe amputation  . IVC FILTER INSERTION  08/2016  . KIDNEY STONE SURGERY    . SHOULDER SURGERY Left     Family History  Problem Relation Age of Onset  . Cancer Mother   . Clotting disorder Mother   . Stroke Mother   . Alzheimer's disease Mother   . Diabetes Mother   . Diabetes Father     Social History:  reports that he quit smoking about 36 years ago. He quit after 1.00 year of use. He has never used smokeless tobacco. No history on file for alcohol use and drug use.  Allergies: No Known Allergies  Medications: I have reviewed the patient's current medications.  Results for orders placed or performed during the hospital encounter of 10/30/20 (from the past 48 hour(s))  Hemoglobin A1c     Status: Abnormal   Collection Time: 11/01/20  6:07 PM  Result Value Ref Range   Hgb A1c MFr Bld 10.5 (H) 4.8 - 5.6 %    Comment: (NOTE)         Prediabetes: 5.7 - 6.4         Diabetes: >6.4         Glycemic control for adults with diabetes: <7.0    Mean Plasma Glucose 255 mg/dL    Comment: (NOTE) Performed At: [facility] [place], [place] [id number] Rush Farmer MD [id number]   CBC with Differential     Status: Abnormal   Collection Time: 11/02/20 12:30 AM  Result Value Ref Range   WBC 7.2 4.0 - 10.5 K/uL   RBC 3.59 (L) 4.22 - 5.81 MIL/uL   Hemoglobin 11.8 (L) 13.0 - 17.0 g/dL   HCT 34.0 (L) 39.0 - 52.0 %   MCV 94.7 80.0 - 100.0 fL   MCH 32.9 26.0 - 34.0 pg   MCHC 34.7 30.0 - 36.0 g/dL   RDW 13.4 11.5 - 15.5 %   Platelets 187 150 - 400 K/uL   nRBC 0.0 0.0 - 0.2 %   Neutrophils Relative % 57 %   Neutro Abs 4.2 1.7 - 7.7 K/uL   Lymphocytes Relative 27 %   Lymphs Abs 1.9 0.7 - 4.0 K/uL   Monocytes Relative 10 %   Monocytes Absolute 0.7 0.1 - 1.0 K/uL   Eosinophils Relative 4 %   Eosinophils Absolute 0.3 0.0 - 0.5 K/uL   Basophils Relative 1 %   Basophils Absolute 0.1 0.0 - 0.1 K/uL   Immature Granulocytes 1 %   Abs Immature  Granulocytes 0.09 (H) 0.00 - 0.07 K/uL    Comment: Performed at [facility], [place] [address] [place], [place] [address]  Comprehensive metabolic panel     Status: Abnormal   Collection Time: 11/02/20 12:30 AM  Result Value Ref Range   Sodium 136 135 - 145 mmol/L   Potassium 3.3 (L) 3.5 - 5.1 mmol/L   Chloride 99 98 - 111 mmol/L   CO2 28 22 - 32 mmol/L   Glucose, Bld 200 (H) 70 - 99 mg/dL    Comment: Glucose reference range applies  only to samples taken after fasting for at least 8 hours.   BUN 33 (H) 6 - 20 mg/dL   Creatinine, Ser 2.04 (H) 0.61 - 1.24 mg/dL   Calcium 8.7 (L) 8.9 - 10.3 mg/dL   Total Protein 6.1 (L) 6.5 - 8.1 g/dL   Albumin 2.4 (L) 3.5 - 5.0 g/dL   AST 16 15 - 41 U/L   ALT 18 0 - 44 U/L   Alkaline Phosphatase 40 38 - 126 U/L   Total Bilirubin 0.7 0.3 - 1.2 mg/dL   GFR, Estimated 37 (L) >60 mL/min    Comment: (NOTE) Calculated using the CKD-EPI Creatinine Equation (2021)    Anion gap 9 5 - 15    Comment: Performed at [facility], [place] [address], [place], [place] [address]  Magnesium     Status: None   Collection Time: 11/02/20 12:30 AM  Result Value Ref Range   Magnesium 1.9 1.7 - 2.4 mg/dL    Comment: Performed at [facility] [address], [place], [place] [address]  Phosphorus     Status: None   Collection Time: 11/02/20 12:30 AM  Result Value Ref Range   Phosphorus 2.9 2.5 - 4.6 mg/dL    Comment: Performed at [facility] [address] [place], [place] [address]  CBC with Differential     Status: Abnormal   Collection Time: 11/03/20  3:58 AM  Result Value Ref Range   WBC 6.6 4.0 - 10.5 K/uL   RBC 3.62 (L) 4.22 - 5.81 MIL/uL   Hemoglobin 11.7 (L) 13.0 - 17.0 g/dL   HCT 34.6 (L) 39.0 - 52.0 %   MCV 95.6 80.0 - 100.0 fL   MCH 32.3 26.0 - 34.0 pg   MCHC 33.8 30.0 - 36.0 g/dL   RDW 13.4 11.5 - 15.5 %   Platelets 201 150 - 400 K/uL   nRBC 0.0 0.0 - 0.2 %   Neutrophils Relative % 56 %   Neutro Abs 3.8 1.7 - 7.7 K/uL    Lymphocytes Relative 27 %   Lymphs Abs 1.8 0.7 - 4.0 K/uL   Monocytes Relative 8 %   Monocytes Absolute 0.5 0.1 - 1.0 K/uL   Eosinophils Relative 5 %   Eosinophils Absolute 0.3 0.0 - 0.5 K/uL   Basophils Relative 1 %   Basophils Absolute 0.1 0.0 - 0.1 K/uL   Immature Granulocytes 3 %   Abs Immature Granulocytes 0.19 (H) 0.00 - 0.07 K/uL    Comment: Performed at [facility] [address], [place], [place] [address]  Comprehensive metabolic panel     Status: Abnormal   Collection Time: 11/03/20  3:58 AM  Result Value Ref Range   Sodium 139 135 - 145 mmol/L   Potassium 3.8 3.5 - 5.1 mmol/L   Chloride 103 98 - 111 mmol/L   CO2 27 22 - 32 mmol/L   Glucose, Bld 199 (H) 70 - 99 mg/dL    Comment: Glucose reference range applies only to samples taken after fasting for at least 8 hours.   BUN 33 (H) 6 - 20 mg/dL   Creatinine, Ser 2.11 (H) 0.61 - 1.24 mg/dL   Calcium 9.0 8.9 - 10.3 mg/dL   Total Protein 6.1 (L) 6.5 - 8.1 g/dL   Albumin 2.5 (L) 3.5 - 5.0 g/dL   AST 17 15 - 41 U/L   ALT 22 0 - 44 U/L   Alkaline Phosphatase 42 38 - 126 U/L  Total Bilirubin 0.5 0.3 - 1.2 mg/dL   GFR, Estimated 36 (L) >60 mL/min    Comment: (NOTE) Calculated using the CKD-EPI Creatinine Equation (2021)    Anion gap 9 5 - 15    Comment: Performed at [facility] [address] [place], [place] [address]  Magnesium     Status: None   Collection Time: 11/03/20  3:58 AM  Result Value Ref Range   Magnesium 2.0 1.7 - 2.4 mg/dL    Comment: Performed at [facility] [address] [place], [place] [address]  Phosphorus     Status: None   Collection Time: 11/03/20  3:58 AM  Result Value Ref Range   Phosphorus 3.5 2.5 - 4.6 mg/dL    Comment: Performed at [facility] [address] [place], [place] [address]    [place] RT LOWER EXTREM LTD SOFT TISSUE NON VASCULAR  Result Date: 11/01/2020 CLINICAL DATA:  Puncture wound right foot, cellulitis EXAM: ULTRASOUND RIGHT LOWER EXTREMITY  LIMITED TECHNIQUE: Ultrasound examination of the lower extremity soft tissues was performed in the area of clinical concern. COMPARISON:  10/30/2020 FINDINGS: Sonographic evaluation of the plantar aspect of the right forefoot was performed at the site of the puncture wound. There is a 6 mm fluid collection deep to the puncture wound, located approximately 10 mm deep to the skin surface, with small tract extending toward the skin surface. Superficial subcutaneous edema is identified. I do not see any foreign body. IMPRESSION: 1. 6 mm fluid collection in the plantar right forefoot at the site of the puncture wound. This is located approximately 10 mm deep to the skin surface. 2. No evidence of foreign body. Electronically Signed   By: Randa Ngo M.D.   On: 11/01/2020 23:47    Review of Systems  HENT: Negative for ear discharge, ear pain, hearing loss and tinnitus.   Eyes: Negative for photophobia and pain.  Respiratory: Negative for cough and shortness of breath.   Cardiovascular: Negative for chest pain.  Gastrointestinal: Negative for abdominal pain, nausea and vomiting.  Genitourinary: Negative for dysuria, flank pain, frequency and urgency.  Musculoskeletal: Positive for arthralgias (RLE/foot). Negative for back pain, myalgias and neck pain.  Skin: Positive for wound.  Neurological: Negative for dizziness and headaches.  Hematological: Does not bruise/bleed easily.  Psychiatric/Behavioral: The patient is not nervous/anxious.    Blood pressure (!) 168/84, pulse 66, temperature 97.8 F (36.6 C), temperature source Oral, resp. rate 20, height 6\' 5"  (1.956 m), weight (!) 172.4 kg, SpO2 97 %. Physical Exam Constitutional:      General: He is not in acute distress.    Appearance: He is well-developed. He is not diaphoretic.  HENT:     Head: Normocephalic and atraumatic.  Eyes:     General: No scleral icterus.       Right eye: No discharge.        Left eye: No discharge.      Conjunctiva/sclera: Conjunctivae normal.  Cardiovascular:     Rate and Rhythm: Normal rate and regular rhythm.  Pulmonary:     Effort: Pulmonary effort is normal. No respiratory distress.  Musculoskeletal:     Cervical back: Normal range of motion.     Comments: RLE No traumatic wounds, ecchymosis, or rash. 2 ovoid ulceration lateral lower leg, NT. Punctate ulceration mid-sole, NT, no discharge.  No knee or ankle effusion  Knee stable to varus/ valgus and anterior/posterior stress  Sens DPN paresthetic, TN/SPN absent  Motor EHL, ext, flex, evers 5/5  DP 2+, PT 1+, 4+ pitting edema  Skin:    General: Skin is warm and dry.  Neurological:     Mental Status: He is alert.  Psychiatric:        Behavior: Behavior normal.     Assessment/Plan: Right foot wound -- Dr. Sharol Given to evaluate. Will check ABI's though expect they will be normal.  Right LE ulcers -- Likely stasis ulcers. Multiple medical problems including anemia of chronic disease, hemochromatosis, pulmonary embolism on long-term anticoagulation, arthritis of the right foot, benign hypertension, stage IIIbCKD, chronic systolic heart failure, lung history of diabetes mellitus, right great toe ulcer s/p partial amputation, hyperlipidemia, GERD, gout arthritis of the left hand, history of COVID-19 disease, LVH, mild persistent asthma, nephrolithiasis, nonalcoholic fatty liver disease, sleep apnea and spinal stenosis -- per primary service    Lisette Abu, PA-C Orthopedic Surgery [phone] 11/03/2020, 2:39 PM

## 2020-11-03 NOTE — Progress Notes (Signed)
PROGRESS NOTE    Sean Valentine  QMG:[id number] DOB: [birth year] DOA: 10/30/2020 PCP: Raina Mina., MD   Chief Complaint  Patient presents with  . 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  Patient was admitted with SIRS/sepsis syndrome likely secondary to right lower extremity cellulitis and infected ulcers.  He's being treated for cellulitis and GBS bacteremia.   Assessment & Plan:   Principal Problem:   Sepsis due to cellulitis [facility]) Active Problems:   Essential hypertension   GERD (gastroesophageal reflux disease)   Mild persistent asthma   Mixed hyperlipidemia   Type 2 diabetes mellitus with stage 3b chronic kidney disease, with long-term current use of insulin (HCC)   Obesity, Class III, BMI 40-49.9 (morbid obesity) ([place])   Sleep apnea   Sepsis ([place])   Bacteremia due to group B Streptococcus  Sepsis due to cellulitis  GBS Bacteremia 2/2 Cellulitis -Sepsis physiology has resolved (sepsis ruled in with fever, leukocytosis and cellulitis/bacteremia) -Cellulitis seems to be improving. -Blood culture grew group B strep (sensistive to ampicillin) -repeat blood cultures from 5/31 NGTDx2 -Patient is currently on IV penicillin G - 6 mm fluid collection in plantar R forefoot at site of puncture wound, I have consulted orthopedic to look at the wound to see if there is any need for I&D.  -echo with EF 50-55%, no RWMA - no evidence of valvular  vegetation on TTE , TEE done today, with no vegetation, but significant for IAS with no PFO. -Antibiotics recommendations per ID  Essential hypertension Continue amlodipine 5 mg p.o. daily. Continue carvedilol 25 mg p.o. daily. Hold ace/thiazide with aki below  Monitor BP, renal function electrolytes.  AKI on CKD IIIb - baseline appears to be ~1.8 in 03/2020 - creatinine above baseline today - hold ace/thiazide for now - trend creatinine  GERD (gastroesophageal reflux disease) Protonix 40 mg p.o. daily.  Mild persistent asthma Supplemental oxygen and bronchodilators as needed. Stable  Mixed hyperlipidemia Off statin. History of NASH.  Type 2 diabetes mellitus with stage 3b chronic kidney disease, with long-term current use of insulin (HCC) Carbohydrate modified diet. Continue concentrated insulin 30 units with meals. Continue Jardiance 25 mg p.o. daily. SSI  Obesity, Class III, BMI 40-49.9 (morbid obesity) (HCC) Lifestyle modifications. Follow-up with PCP.  Sleep apnea BiPAP at bedtime.  DVT prophylaxis: xarelto Code Status: full  Family Communication: D/W family  at bedside Disposition:   Status is: Inpatient  Remains inpatient appropriate because:Inpatient level of care appropriate due to severity of illness   Dispo: The patient is from: Home              Anticipated d/c is to: Home              Patient currently is not medically stable to d/c.   Difficult to place patient No       Consultants:   ID  Procedures: Echo IMPRESSIONS    1. Left ventricular ejection fraction, by estimation, is 50 to 55%. The  left ventricle has low normal function. The left ventricle has no regional  wall motion abnormalities. There is mild left ventricular hypertrophy.  Left ventricular diastolic  parameters are indeterminate. Elevated left ventricular end-diastolic  pressure.  2. Right ventricular systolic function is normal. The right ventricular  size  is normal. Tricuspid regurgitation signal is inadequate for assessing  PA pressure.  3. The mitral valve is normal in structure. Trivial mitral valve  regurgitation. No evidence of mitral stenosis.  4. The aortic valve is grossly normal. Aortic valve regurgitation is not  visualized. No aortic stenosis is present.  5. Aortic dilatation noted. There is mild dilatation of the aortic root,  measuring 40 mm.   Conclusion(s)/Recommendation(s): No evidence of valvular vegetations on  this transthoracic echocardiogram. Would consider a transesophageal  echocardiogram to exclude infective endocarditis if clinically indicated.   Antimicrobials: Anti-infectives (From admission, onward)   Start     Dose/Rate Route Frequency Ordered Stop   10/31/20 0400  vancomycin (VANCOREADY) IVPB 2000 mg/400 mL  Status:  Discontinued        2,000 mg 200 mL/hr over 120 Minutes Intravenous Every 24 hours 10/30/20 0631 10/30/20 1729   10/30/20 2000  [MAR Hold]  penicillin G potassium 4 Million Units in dextrose 5 % 250 mL IVPB        (MAR Hold since Fri 11/03/2020 at 1153.Hold Reason: Transfer to a Procedural area.)   4 Million Units 250 mL/hr over 60 Minutes Intravenous Every 4 hours 10/30/20 1729     10/30/20 1200  ceFEPIme (MAXIPIME) 2 g in sodium chloride 0.9 % 100 mL IVPB  Status:  Discontinued        2 g 200 mL/hr over 30 Minutes Intravenous Every 8 hours 10/30/20 0631 10/30/20 1729   10/30/20 1000  metroNIDAZOLE (FLAGYL) IVPB 500 mg  Status:  Discontinued        500 mg 100 mL/hr over 60 Minutes Intravenous Every 8 hours 10/30/20 0613 10/30/20 1729   10/30/20 0300  vancomycin (VANCOCIN) 2,500 mg in sodium chloride 0.9 % 500 mL IVPB        2,500 mg 250 mL/hr over 120 Minutes Intravenous  Once 10/30/20 0230 10/30/20 0632   10/30/20 0245  ceFEPIme (MAXIPIME) 2 g in sodium chloride 0.9 % 100 mL IVPB        2 g 200 mL/hr over 30 Minutes Intravenous  Once 10/30/20 0230 10/30/20 0453   10/30/20 0245   metroNIDAZOLE (FLAGYL) IVPB 500 mg        500 mg 100 mL/hr over 60 Minutes Intravenous  Once 10/30/20 0230 10/30/20 0406         Subjective: No new complaints  Objective: Vitals:   11/03/20 1205 11/03/20 1254 11/03/20 1304 11/03/20 1309  BP: (!) 191/81 138/74 (!) 165/84 (!) 163/80  Pulse: 70 74 71 66  Resp: 10 11 16 11   Temp: 98.2 F (36.8 C) 98.1 F (36.7 C)    TempSrc: Oral Oral    SpO2: 100% 99% 98% 98%  Weight: (!) 172.4 kg     Height: 6\' 5"  (1.956 m)       Intake/Output Summary (Last 24 hours) at 11/03/2020 1314 Last data filed at 11/03/2020 1246 Gross per 24 hour  Intake 100 ml  Output --  Net 100 ml   Filed Weights   10/30/20 0203 11/03/20 1205  Weight: (!) 172.4 kg (!) 172.4 kg    Examination:  Awake Alert, Oriented X 3, No new F.N deficits, Normal affect Symmetrical Chest wall movement, Good air movement bilaterally, CTAB RRR,No Gallops,Rubs or new Murmurs,  No Parasternal Heave +ve B.Sounds, Abd Soft, No tenderness, No rebound - guarding or rigidity. No Cyanosis, Clubbing or edema, right lower extremity chronic wounds, bandaged, he has a small wound at plantar surface, no discharge or foul-smelling odor   Data Reviewed: I have personally reviewed following labs and imaging studies  CBC: Recent Labs  Lab 10/30/20 0226 10/31/20 0021 11/01/20 0156 11/02/20 0030 11/03/20 0358  WBC 13.3* 10.3 7.6 7.2 6.6  NEUTROABS 11.4* 8.4* 4.2 4.2 3.8  HGB 13.1 11.6* 11.7* 11.8* 11.7*  HCT 38.6* 34.3* 34.2* 34.0* 34.6*  MCV 95.8 95.5 95.8 94.7 95.6  PLT 201 182 165 187 378    Basic Metabolic Panel: Recent Labs  Lab 10/30/20 0226 10/31/20 0021 11/02/20 0030 11/03/20 0358  NA 138 134* 136 139  K 3.5 3.3* 3.3* 3.8  CL 103 101 99 103  CO2 24 25 28 27   GLUCOSE 334* 277* 200* 199*  BUN 37* 28* 33* 33*  CREATININE 2.07* 2.19* 2.04* 2.11*  CALCIUM 8.7* 8.3* 8.7* 9.0  MG 1.7  --  1.9 2.0  PHOS  --   --  2.9 3.5    GFR: Estimated Creatinine Clearance: 66.9  mL/min (A) (by C-G formula based on SCr of 2.11 mg/dL (H)).  Liver Function Tests: Recent Labs  Lab 10/30/20 0226 10/31/20 0021 11/02/20 0030 11/03/20 0358  AST 24 15 16 17   ALT 24 19 18 22   ALKPHOS 52 43 40 42  BILITOT 0.8 0.9 0.7 0.5  PROT 6.5 5.8* 6.1* 6.1*  ALBUMIN 2.9* 2.5* 2.4* 2.5*    CBG: Recent Labs  Lab 10/30/20 1717 10/30/20 2020  GLUCAP 341* 318*     Recent Results (from the past 240 hour(s))  Blood Culture (routine x 2)     Status: Abnormal   Collection Time: 10/30/20  2:57 AM   Specimen: BLOOD  Result Value Ref Range Status   Specimen Description BLOOD RIGHT ANTECUBITAL  Final   Special Requests   Final    BOTTLES DRAWN AEROBIC AND ANAEROBIC Blood Culture adequate volume   Culture  Setup Time   Final    GRAM POSITIVE COCCI IN CHAINS IN BOTH AEROBIC AND ANAEROBIC BOTTLES CRITICAL RESULT CALLED TO, READ BACK BY AND VERIFIED WITH: Lafe Garin [phone] FCP Performed at [facility], [address], [place], [place] [address]    Culture GROUP B STREP(S.AGALACTIAE)ISOLATED (A)  Final   Report Status 11/01/2020 FINAL  Final   Organism ID, Bacteria GROUP B STREP(S.AGALACTIAE)ISOLATED  Final      Susceptibility   Group b strep(s.agalactiae)isolated - MIC*    CLINDAMYCIN >=1 RESISTANT Resistant     AMPICILLIN <=0.25 SENSITIVE Sensitive     ERYTHROMYCIN >=8 RESISTANT Resistant     VANCOMYCIN 0.5 SENSITIVE Sensitive     CEFTRIAXONE <=0.12 SENSITIVE Sensitive     LEVOFLOXACIN 1 SENSITIVE Sensitive     * GROUP B STREP(S.AGALACTIAE)ISOLATED  Blood Culture ID Panel (Reflexed)     Status: Abnormal   Collection Time: 10/30/20  2:57 AM  Result Value Ref Range Status   Enterococcus faecalis NOT DETECTED NOT DETECTED Final   Enterococcus Faecium NOT DETECTED NOT DETECTED Final   Listeria monocytogenes NOT DETECTED NOT DETECTED Final   Staphylococcus species NOT DETECTED NOT DETECTED Final   Staphylococcus aureus (BCID) NOT DETECTED NOT DETECTED Final    Staphylococcus epidermidis NOT DETECTED NOT DETECTED Final   Staphylococcus lugdunensis NOT DETECTED NOT DETECTED Final   Streptococcus species DETECTED (A) NOT DETECTED Final  Comment: CRITICAL RESULT CALLED TO, READ BACK BY AND VERIFIED WITH: PHARMD Kathreen Cosier. [id number] FCP    Streptococcus agalactiae DETECTED (A) NOT DETECTED Final    Comment: CRITICAL RESULT CALLED TO, READ BACK BY AND VERIFIED WITH: PHARMD FRANK W. [id number] FCP    Streptococcus pneumoniae NOT DETECTED NOT DETECTED Final   Streptococcus pyogenes NOT DETECTED NOT DETECTED Final   A.calcoaceticus-baumannii NOT DETECTED NOT DETECTED Final   Bacteroides fragilis NOT DETECTED NOT DETECTED Final   Enterobacterales NOT DETECTED NOT DETECTED Final   Enterobacter cloacae complex NOT DETECTED NOT DETECTED Final   Escherichia coli NOT DETECTED NOT DETECTED Final   Klebsiella aerogenes NOT DETECTED NOT DETECTED Final   Klebsiella oxytoca NOT DETECTED NOT DETECTED Final   Klebsiella pneumoniae NOT DETECTED NOT DETECTED Final   Proteus species NOT DETECTED NOT DETECTED Final   Salmonella species NOT DETECTED NOT DETECTED Final   Serratia marcescens NOT DETECTED NOT DETECTED Final   Haemophilus influenzae NOT DETECTED NOT DETECTED Final   Neisseria meningitidis NOT DETECTED NOT DETECTED Final   Pseudomonas aeruginosa NOT DETECTED NOT DETECTED Final   Stenotrophomonas maltophilia NOT DETECTED NOT DETECTED Final   Candida albicans NOT DETECTED NOT DETECTED Final   Candida auris NOT DETECTED NOT DETECTED Final   Candida glabrata NOT DETECTED NOT DETECTED Final   Candida krusei NOT DETECTED NOT DETECTED Final   Candida parapsilosis NOT DETECTED NOT DETECTED Final   Candida tropicalis NOT DETECTED NOT DETECTED Final   Cryptococcus neoformans/gattii NOT DETECTED NOT DETECTED Final    Comment: Performed at [facility], [address], [place], [place] [address]  Blood Culture (routine x 2)     Status: Abnormal    Collection Time: 10/30/20  3:50 AM   Specimen: BLOOD RIGHT FOREARM  Result Value Ref Range Status   Specimen Description BLOOD RIGHT FOREARM  Final   Special Requests   Final    BOTTLES DRAWN AEROBIC AND ANAEROBIC Blood Culture results may not be optimal due to an inadequate volume of blood received in culture bottles   Culture  Setup Time   Final    GRAM POSITIVE COCCI IN CHAINS IN BOTH AEROBIC AND ANAEROBIC BOTTLES    Culture (A)  Final    GROUP B STREP(S.AGALACTIAE)ISOLATED SUSCEPTIBILITIES PERFORMED ON PREVIOUS CULTURE WITHIN THE LAST 5 DAYS. Performed at [facility], [place] [address] [place], [place] [address]    Report Status 11/01/2020 FINAL  Final  Resp Panel by RT-PCR (Flu A&B, Covid) Nasopharyngeal Swab     Status: None   Collection Time: 10/30/20  3:57 AM   Specimen: Nasopharyngeal Swab; Nasopharyngeal(NP) swabs in vial transport medium  Result Value Ref Range Status   SARS Coronavirus 2 by RT PCR NEGATIVE NEGATIVE Final    Comment: (NOTE) SARS-CoV-2 target nucleic acids are NOT DETECTED.  The SARS-CoV-2 RNA is generally detectable in upper respiratory specimens during the acute phase of infection. The lowest concentration of SARS-CoV-2 viral copies this assay can detect is 138 copies/mL. A negative result does not preclude SARS-Cov-2 infection and should not be used as the sole basis for treatment or other patient management decisions. A negative result may occur with  improper specimen collection/handling, submission of specimen other than nasopharyngeal swab, presence of viral mutation(s) within the areas targeted by this assay, and inadequate number of viral copies(<138 copies/mL). A negative result must be combined with clinical observations, patient history, and epidemiological information. The expected result is Negative.  Fact Sheet for  Patients:  EntrepreneurPulse.com.au  Fact Sheet for Healthcare Providers:   IncredibleEmployment.be  This test is no t yet approved or cleared by the [place] FDA and  has been authorized for detection and/or diagnosis of SARS-CoV-2 by FDA under an Emergency Use Authorization (EUA). This EUA will remain  in effect (meaning this test can be used) for the duration of the COVID-19 declaration under Section 564(b)(1) of the Act, 21 U.S.C.section 360bbb-3(b)(1), unless the authorization is terminated  or revoked sooner.       Influenza A by PCR NEGATIVE NEGATIVE Final   Influenza B by PCR NEGATIVE NEGATIVE Final    Comment: (NOTE) The Xpert Xpress SARS-CoV-2/FLU/RSV plus assay is intended as an aid in the diagnosis of influenza from Nasopharyngeal swab specimens and should not be used as a sole basis for treatment. Nasal washings and aspirates are unacceptable for Xpert Xpress SARS-CoV-2/FLU/RSV testing.  Fact Sheet for Patients: EntrepreneurPulse.com.au  Fact Sheet for Healthcare Providers: IncredibleEmployment.be  This test is not yet approved or cleared by the [place] FDA and has been authorized for detection and/or diagnosis of SARS-CoV-2 by FDA under an Emergency Use Authorization (EUA). This EUA will remain in effect (meaning this test can be used) for the duration of the COVID-19 declaration under Section 564(b)(1) of the Act, 21 U.S.C. section 360bbb-3(b)(1), unless the authorization is terminated or revoked.  Performed at [facility], [place] [address] [place], [place] [address]   Culture, blood (routine x 2)     Status: None (Preliminary result)   Collection Time: 10/31/20  8:19 PM   Specimen: BLOOD  Result Value Ref Range Status   Specimen Description BLOOD LEFT ANTECUBITAL  Final   Special Requests   Final    BOTTLES DRAWN AEROBIC AND ANAEROBIC Blood Culture adequate volume   Culture   Final    NO GROWTH 3 DAYS Performed at [facility], [place] [address]  [place], [place] [address]    Report Status PENDING  Incomplete  Culture, blood (routine x 2)     Status: None (Preliminary result)   Collection Time: 10/31/20  8:22 PM   Specimen: BLOOD LEFT HAND  Result Value Ref Range Status   Specimen Description BLOOD LEFT HAND  Final   Special Requests   Final    BOTTLES DRAWN AEROBIC AND ANAEROBIC Blood Culture adequate volume   Culture   Final    NO GROWTH 3 DAYS Performed at [facility], [place] [address], [place], [place] [address]    Report Status PENDING  Incomplete         Radiology Studies: ECHOCARDIOGRAM COMPLETE  Result Date: 11/01/2020    ECHOCARDIOGRAM REPORT   Patient Name:   Sean Valentine Date of Exam: 11/01/2020 Medical Rec #:  [id number]   Height:       77.0 in Accession #:    [id number]  Weight:       380.0 lb Date of Birth:  [birth year]   BSA:          2.938 m Patient Age:    32 years    BP:           133/69 mmHg Patient Gender: M           HR:           65 bpm. Exam Location:  Inpatient Procedure: 2D Echo, Cardiac Doppler and Color Doppler Indications:    Bacteremia  History:        Patient  has no prior history of Echocardiogram examinations.                 CHF; Risk Factors:Diabetes, Hypertension and Dyslipidemia.  Sonographer:    Cammy Brochure Referring Phys: [phone] A CALDWELL POWELL JR  Sonographer Comments: Image acquisition challenging due to patient body habitus. IMPRESSIONS  1. Left ventricular ejection fraction, by estimation, is 50 to 55%. The left ventricle has low normal function. The left ventricle has no regional wall motion abnormalities. There is mild left ventricular hypertrophy. Left ventricular diastolic parameters are indeterminate. Elevated left ventricular end-diastolic pressure.  2. Right ventricular systolic function is normal. The right ventricular size is normal. Tricuspid regurgitation signal is inadequate for assessing PA pressure.  3. The mitral valve is normal in structure. Trivial mitral valve regurgitation.  No evidence of mitral stenosis.  4. The aortic valve is grossly normal. Aortic valve regurgitation is not visualized. No aortic stenosis is present.  5. Aortic dilatation noted. There is mild dilatation of the aortic root, measuring 40 mm. Conclusion(s)/Recommendation(s): No evidence of valvular vegetations on this transthoracic echocardiogram. Would consider a transesophageal echocardiogram to exclude infective endocarditis if clinically indicated. FINDINGS  Left Ventricle: Left ventricular ejection fraction, by estimation, is 50 to 55%. The left ventricle has low normal function. The left ventricle has no regional wall motion abnormalities. The left ventricular internal cavity size was normal in size. There is mild left ventricular hypertrophy. Left ventricular diastolic parameters are indeterminate. Elevated left ventricular end-diastolic pressure. Right Ventricle: The right ventricular size is normal. No increase in right ventricular wall thickness. Right ventricular systolic function is normal. Tricuspid regurgitation signal is inadequate for assessing PA pressure. Left Atrium: Left atrial size was normal in size. Right Atrium: Right atrial size was normal in size. Pericardium: Trivial pericardial effusion is present. Mitral Valve: The mitral valve is normal in structure. Trivial mitral valve regurgitation. No evidence of mitral valve stenosis. Tricuspid Valve: The tricuspid valve is normal in structure. Tricuspid valve regurgitation is trivial. No evidence of tricuspid stenosis. Aortic Valve: The aortic valve is grossly normal. Aortic valve regurgitation is not visualized. No aortic stenosis is present. Aortic valve mean gradient measures 5.0 mmHg. Aortic valve peak gradient measures 8.6 mmHg. Aortic valve area, by VTI measures 3.68 cm. Pulmonic Valve: The pulmonic valve was normal in structure. Pulmonic valve regurgitation is trivial. No evidence of pulmonic stenosis. Aorta: Aortic dilatation noted. There is  mild dilatation of the aortic root, measuring 40 mm. IAS/Shunts: No atrial level shunt detected by color flow Doppler.  LEFT VENTRICLE PLAX 2D LVIDd:         5.00 cm  Diastology LVIDs:         3.50 cm  LV e' medial:    4.35 cm/s LV PW:         1.50 cm  LV E/e' medial:  26.2 LV IVS:        1.30 cm  LV e' lateral:   6.53 cm/s LVOT diam:     2.30 cm  LV E/e' lateral: 17.5 LV SV:         116 LV SV Index:   39 LVOT Area:     4.15 cm  RIGHT VENTRICLE RV Basal diam:  3.00 cm RV S prime:     14.80 cm/s TAPSE (M-mode): 2.3 cm LEFT ATRIUM             Index       RIGHT ATRIUM  Index LA diam:        4.50 cm 1.53 cm/m  RA Area:     17.20 cm LA Vol (A2C):   65.5 ml 22.29 ml/m RA Volume:   45.90 ml  15.62 ml/m LA Vol (A4C):   78.9 ml 26.83 ml/m LA Biplane Vol: 67.9 ml 23.11 ml/m  AORTIC VALVE AV Area (Vmax):    3.42 cm AV Area (Vmean):   3.28 cm AV Area (VTI):     3.68 cm AV Vmax:           147.00 cm/s AV Vmean:          103.000 cm/s AV VTI:            0.315 m AV Peak Grad:      8.6 mmHg AV Mean Grad:      5.0 mmHg LVOT Vmax:         121.00 cm/s LVOT Vmean:        81.200 cm/s LVOT VTI:          0.279 m LVOT/AV VTI ratio: 0.89  AORTA Ao Root diam: 4.00 cm Ao Asc diam:  3.50 cm MITRAL VALVE MV Area (PHT): 5.02 cm     SHUNTS MV Decel Time: 151 msec     Systemic VTI:  0.28 m MV E velocity: 114.00 cm/s  Systemic Diam: 2.30 cm MV A velocity: 109.00 cm/s MV E/A ratio:  1.05 Cherlynn Kaiser MD Electronically signed by Cherlynn Kaiser MD Signature Date/Time: 11/01/2020/4:06:37 PM    Final    [place] RT LOWER EXTREM LTD SOFT TISSUE NON VASCULAR  Result Date: 11/01/2020 CLINICAL DATA:  Puncture wound right foot, cellulitis EXAM: ULTRASOUND RIGHT LOWER EXTREMITY LIMITED TECHNIQUE: Ultrasound examination of the lower extremity soft tissues was performed in the area of clinical concern. COMPARISON:  10/30/2020 FINDINGS: Sonographic evaluation of the plantar aspect of the right forefoot was performed at the site of the puncture  wound. There is a 6 mm fluid collection deep to the puncture wound, located approximately 10 mm deep to the skin surface, with small tract extending toward the skin surface. Superficial subcutaneous edema is identified. I do not see any foreign body. IMPRESSION: 1. 6 mm fluid collection in the plantar right forefoot at the site of the puncture wound. This is located approximately 10 mm deep to the skin surface. 2. No evidence of foreign body. Electronically Signed   By: Randa Ngo M.D.   On: 11/01/2020 23:47        Scheduled Meds: . [MAR Hold] carvedilol  25 mg Oral BID WC  . [MAR Hold] empagliflozin  25 mg Oral Daily  . [MAR Hold] guaiFENesin  600 mg Oral BID  . [MAR Hold] insulin aspart  0-20 Units Subcutaneous TID WC  . [MAR Hold] insulin aspart  0-5 Units Subcutaneous QHS  . [MAR Hold] insulin regular human CONCENTRATED  30 Units Subcutaneous TID WC  . [MAR Hold] isosorbide mononitrate  30 mg Oral Daily  . [MAR Hold] mupirocin ointment   Nasal BID  . pneumococcal 23 valent vaccine  0.5 mL Intramuscular Tomorrow-1000  . [MAR Hold] rivaroxaban  20 mg Oral Q supper  . [MAR Hold] Vitamin D (Ergocalciferol)  50,000 Units Oral Q Mon   Continuous Infusions: . sodium chloride 20 mL/hr at 11/03/20 0606  . [MAR Hold] pencillin G potassium IV 4 Million Units (11/03/20 0834)     LOS: 4 days       Phillips Climes, MD [facility]  To contact the attending provider between 7A-7P or the covering provider during after hours 7P-7A, please log into the web site [email or web address] and access using universal [place] password for that web site. If you do not have the password, please call the hospital operator.  11/03/2020, 1:14 PM

## 2020-11-03 NOTE — Transfer of Care (Signed)
Immediate Anesthesia Transfer of Care Note  Patient: Sean Valentine  Procedure(s) Performed: TRANSESOPHAGEAL ECHOCARDIOGRAM (TEE) (N/A ) BUBBLE STUDY  Patient Location: PACU and Endoscopy Unit  Anesthesia Type:MAC  Level of Consciousness: awake, oriented and patient cooperative  Airway & Oxygen Therapy: Patient Spontanous Breathing and Patient connected to nasal cannula oxygen  Post-op Assessment: Report given to RN and Post -op Vital signs reviewed and stable  Post vital signs: Reviewed and stable  Last Vitals:  Vitals Value Taken Time  BP 138/74 11/03/20 1254  Temp 36.7 C 11/03/20 1254  Pulse 71 11/03/20 1258  Resp 15 11/03/20 1258  SpO2 97 % 11/03/20 1258  Vitals shown include unvalidated device data.  Last Pain:  Vitals:   11/03/20 1254  TempSrc: Oral  PainSc: 0-No pain         Complications: No complications documented.

## 2020-11-03 NOTE — Progress Notes (Signed)
  Echocardiogram Echocardiogram Transesophageal has been performed.  Sean Valentine 11/03/2020, 2:02 PM

## 2020-11-03 NOTE — Interval H&P Note (Signed)
History and Physical Interval Note:  11/03/2020 11:55 AM  Sean Valentine  has presented today for surgery, with the diagnosis of BACTERIMIA.  The various methods of treatment have been discussed with the patient and family. After consideration of risks, benefits and other options for treatment, the patient has consented to  Procedure(s): TRANSESOPHAGEAL ECHOCARDIOGRAM (TEE) (N/A) as a surgical intervention.  The patient's history has been reviewed, patient examined, no change in status, stable for surgery.  I have reviewed the patient's chart and labs.  Questions were answered to the patient's satisfaction.     TEE to exclude endocarditis. NPO.   [place] T. Audie Box, MD, [place]  [address] [place], [place] [address] [phone]  11:55 AM

## 2020-11-03 NOTE — Anesthesia Postprocedure Evaluation (Signed)
Anesthesia Post Note  Patient: Sean Valentine  Procedure(s) Performed: TRANSESOPHAGEAL ECHOCARDIOGRAM (TEE) (N/A ) BUBBLE STUDY     Patient location during evaluation: Endoscopy Anesthesia Type: MAC Level of consciousness: awake and alert Pain management: pain level controlled Vital Signs Assessment: post-procedure vital signs reviewed and stable Respiratory status: spontaneous breathing, nonlabored ventilation, respiratory function stable and patient connected to nasal cannula oxygen Cardiovascular status: stable and blood pressure returned to baseline Postop Assessment: no apparent nausea or vomiting Anesthetic complications: no   No complications documented.  Last Vitals:  Vitals:   11/03/20 1309 11/03/20 1331  BP: (!) 163/80 (!) 168/84  Pulse: 66 66  Resp: 11 20  Temp:  36.6 C  SpO2: 98% 97%    Last Pain:  Vitals:   11/03/20 1331  TempSrc: Oral  PainSc:                  [name]

## 2020-11-03 NOTE — Anesthesia Preprocedure Evaluation (Addendum)
Anesthesia Evaluation  Patient identified by MRN, date of birth, ID band Patient awake    Reviewed: Allergy & Precautions, NPO status , Patient's Chart, lab work & pertinent test results  Airway Mallampati: III  TM Distance: >3 FB Neck ROM: Full    Dental  (+) Poor Dentition   Pulmonary asthma , sleep apnea and Continuous Positive Airway Pressure Ventilation , former smoker, PE (2018 on Xarelto)   Pulmonary exam normal        Cardiovascular hypertension, Pt. on medications and Pt. on home beta blockers +CHF   Rhythm:Regular Rate:Normal     Neuro/Psych    GI/Hepatic GERD  ,(+) Hepatitis -  Endo/Other  diabetes, Type 2, Insulin Dependent  Renal/GU CRFRenal disease     Musculoskeletal  (+) Arthritis ,   Abdominal (+)  Abdomen: soft. Bowel sounds: normal.  Peds  Hematology  (+) anemia ,   Anesthesia Other Findings Bacteremia   Reproductive/Obstetrics                            Anesthesia Physical Anesthesia Plan  ASA: III  Anesthesia Plan: MAC   Post-op Pain Management:    Induction: Intravenous  PONV Risk Score and Plan: 1 and Propofol infusion  Airway Management Planned: Simple Face Mask, Natural Airway and Nasal Cannula  Additional Equipment: None  Intra-op Plan:   Post-operative Plan:   Informed Consent:   Plan Discussed with:   Anesthesia Plan Comments: (Lab Results      Component                Value               Date                      WBC                      6.6                 11/03/2020                HGB                      11.7 (L)            11/03/2020                HCT                      34.6 (L)            11/03/2020                MCV                      95.6                11/03/2020                PLT                      201                 11/03/2020           Lab Results      Component  Value               Date                       NA                       139                 11/03/2020                K                        3.8                 11/03/2020                CO2                      27                  11/03/2020                GLUCOSE                  199 (H)             11/03/2020                BUN                      33 (H)              11/03/2020                CREATININE               2.11 (H)            11/03/2020                CALCIUM                  9.0                 11/03/2020                GFRNONAA                 36 (L)              11/03/2020           ECHO 11/01/20: 1. Left ventricular ejection fraction, by estimation, is 50 to 55%. The  left ventricle has low normal function. The left ventricle has no regional  wall motion abnormalities. There is mild left ventricular hypertrophy.  Left ventricular diastolic  parameters are indeterminate. Elevated left ventricular end-diastolic  pressure.  2. Right ventricular systolic function is normal. The right ventricular  size is normal. Tricuspid regurgitation signal is inadequate for assessing  PA pressure.  3. The mitral valve is normal in structure. Trivial mitral valve  regurgitation. No evidence of mitral stenosis.  4. The aortic valve is grossly normal. Aortic valve regurgitation is not  visualized. No aortic stenosis is present.  5. Aortic dilatation noted. There is mild dilatation of the aortic root,  measuring 40 mm.   Conclusion(s)/Recommendation(s): No evidence of valvular vegetations on  this transthoracic echocardiogram. Would consider a transesophageal  echocardiogram to exclude infective  endocarditis if clinically indicated. )        Anesthesia Quick Evaluation

## 2020-11-04 ENCOUNTER — Encounter (HOSPITAL_COMMUNITY): Payer: PRIVATE HEALTH INSURANCE

## 2020-11-04 DIAGNOSIS — L0390 Cellulitis, unspecified: Secondary | ICD-10-CM | POA: Diagnosis not present

## 2020-11-04 DIAGNOSIS — N179 Acute kidney failure, unspecified: Secondary | ICD-10-CM

## 2020-11-04 DIAGNOSIS — R7881 Bacteremia: Secondary | ICD-10-CM | POA: Diagnosis not present

## 2020-11-04 DIAGNOSIS — E1122 Type 2 diabetes mellitus with diabetic chronic kidney disease: Secondary | ICD-10-CM | POA: Diagnosis not present

## 2020-11-04 DIAGNOSIS — I87331 Chronic venous hypertension (idiopathic) with ulcer and inflammation of right lower extremity: Secondary | ICD-10-CM | POA: Diagnosis not present

## 2020-11-04 DIAGNOSIS — A419 Sepsis, unspecified organism: Secondary | ICD-10-CM | POA: Diagnosis not present

## 2020-11-04 DIAGNOSIS — R6520 Severe sepsis without septic shock: Secondary | ICD-10-CM

## 2020-11-04 LAB — CBC WITH DIFFERENTIAL/PLATELET
Abs Immature Granulocytes: 0.19 10*3/uL — ABNORMAL HIGH (ref 0.00–0.07)
Basophils Absolute: 0.1 10*3/uL (ref 0.0–0.1)
Basophils Relative: 1 %
Eosinophils Absolute: 0.3 10*3/uL (ref 0.0–0.5)
Eosinophils Relative: 3 %
HCT: 34.9 % — ABNORMAL LOW (ref 39.0–52.0)
Hemoglobin: 11.8 g/dL — ABNORMAL LOW (ref 13.0–17.0)
Immature Granulocytes: 2 %
Lymphocytes Relative: 20 %
Lymphs Abs: 1.9 10*3/uL (ref 0.7–4.0)
MCH: 32.1 pg (ref 26.0–34.0)
MCHC: 33.8 g/dL (ref 30.0–36.0)
MCV: 94.8 fL (ref 80.0–100.0)
Monocytes Absolute: 0.6 10*3/uL (ref 0.1–1.0)
Monocytes Relative: 6 %
Neutro Abs: 6.5 10*3/uL (ref 1.7–7.7)
Neutrophils Relative %: 68 %
Platelets: 209 10*3/uL (ref 150–400)
RBC: 3.68 MIL/uL — ABNORMAL LOW (ref 4.22–5.81)
RDW: 13.3 % (ref 11.5–15.5)
WBC: 9.6 10*3/uL (ref 4.0–10.5)
nRBC: 0 % (ref 0.0–0.2)

## 2020-11-04 LAB — BASIC METABOLIC PANEL
Anion gap: 9 (ref 5–15)
BUN: 26 mg/dL — ABNORMAL HIGH (ref 6–20)
CO2: 24 mmol/L (ref 22–32)
Calcium: 8.6 mg/dL — ABNORMAL LOW (ref 8.9–10.3)
Chloride: 105 mmol/L (ref 98–111)
Creatinine, Ser: 1.89 mg/dL — ABNORMAL HIGH (ref 0.61–1.24)
GFR, Estimated: 41 mL/min — ABNORMAL LOW (ref >60)
Glucose, Bld: 190 mg/dL — ABNORMAL HIGH (ref 70–99)
Potassium: 3.6 mmol/L (ref 3.5–5.1)
Sodium: 138 mmol/L (ref 135–145)

## 2020-11-04 MED ORDER — AMOXICILLIN 500 MG PO TABS: 500.0000 mg | ORAL_TABLET | Freq: Two times a day (BID) | ORAL | 0 refills | Status: AC

## 2020-11-04 NOTE — Progress Notes (Signed)
Sean Valentine to be D/C'd Home per MD order.  Discussed with the patient and all questions fully answered.  VSS, Skin clean, dry and intact without evidence of skin break down, no evidence of skin tears noted. IV catheter discontinued intact. Site without signs and symptoms of complications. Dressing and pressure applied.  An After Visit Summary was printed and given to the patient. Patient received prescription.  D/c education completed with patient/family including follow up instructions, medication list, d/c activities limitations if indicated, with other d/c instructions as indicated by MD - patient able to verbalize understanding, all questions fully answered.   Patient instructed to return to ED, call 911, or call MD for any changes in condition.   Patient escorted via [place], and D/C home via private auto.  [place] 11/04/2020 6:56 PM

## 2020-11-04 NOTE — Discharge Instructions (Signed)
Follow with Primary MD Raina Mina., MD in 7 days   Get CBC, CMP, 2 view Chest X ray checked  by Primary MD next visit.    Activity: As tolerated with Full fall precautions use walker/cane & assistance as needed   Disposition Home    Diet: Heart Healthy /carb modified , with feeding assistance and aspiration precautions.    On your next visit with your primary care physician please Get Medicines reviewed and adjusted.   Please request your Prim.MD to go over all Hospital Tests and Procedure/Radiological results at the follow up, please get all Hospital records sent to your Prim MD by signing hospital release before you go home.   If you experience worsening of your admission symptoms, develop shortness of breath, life threatening emergency, suicidal or homicidal thoughts you must seek medical attention immediately by calling 911 or calling your MD immediately  if symptoms less severe.  You Must read complete instructions/literature along with all the possible adverse reactions/side effects for all the Medicines you take and that have been prescribed to you. Take any new Medicines after you have completely understood and accpet all the possible adverse reactions/side effects.   Do not drive, operating heavy machinery, perform activities at heights, swimming or participation in water activities or provide baby sitting services if your were admitted for syncope or siezures until you have seen by Primary MD or a Neurologist and advised to do so again.  Do not drive when taking Pain medications.    Do not take more than prescribed Pain, Sleep and Anxiety Medications  Special Instructions: If you have smoked or chewed Tobacco  in the last 2 yrs please stop smoking, stop any regular Alcohol  and or any Recreational drug use.  Wear Seat belts while driving.   Please note  You were cared for by a hospitalist during your hospital stay. If you have any questions about your discharge  medications or the care you received while you were in the hospital after you are discharged, you can call the unit and asked to speak with the hospitalist on call if the hospitalist that took care of you is not available. Once you are discharged, your primary care physician will handle any further medical issues. Please note that NO REFILLS for any discharge medications will be authorized once you are discharged, as it is imperative that you return to your primary care physician (or establish a relationship with a primary care physician if you do not have one) for your aftercare needs so that they can reassess your need for medications and monitor your lab values.

## 2020-11-04 NOTE — Discharge Summary (Signed)
Physician Discharge Summary  Sean Valentine MGN:[id number] DOB: 14-Nov-1965 DOA: 10/30/2020  PCP: Raina Mina., MD  Admit date: 10/30/2020 Discharge date: 11/04/2020  Admitted From:Home Disposition:  Home   Recommendations for Outpatient Follow-up:  1. Follow up with PCP in 1-2 weeks 2. 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 Patient was admitted with SIRS/sepsis syndrome likely secondary to right lower extremity cellulitis and infected ulcers.  He's being treated for cellulitis and GBS bacteremia.   Sepsis due to cellulitis / chronic venous stasis wound / GBS Bacteremia 2/2 Cellulitis -Sepsis POA, sepsis  physiology has resolved (sepsis ruled in with fever, leukocytosis and cellulitis/bacteremia) -Cellulitis seems to be improving. -Blood culture grew group B strep (sensistive to ampicillin) -repeat blood cultures from 5/31 NGTD -Patient is currently on IV penicillin G, ID input greatly appreciated, to finish total of 15 days treatment from negative cultures to date, to continue with amoxicillin on discharge till 6/15, and then to follow with ID that same day to see if antibiotics need to be extended. - 6 mm fluid collection in plantar R forefoot at site of  wound,  as well chronic leg ulcers,  orthopedic Dr. Sharol Given has been consulted, recommendation for compression stocking, no need for I&D, he will follow as an outpatient in 1 week. -echo with EF 50-55%, no RWMA - no evidence of valvular vegetation on TTE , TEE done today, with no vegetation, but significant for IAS with no PFO.   Essential hypertension -Overall blood pressure has been acceptable on Coreg and Imdur, zoomed ACE/thiazide on discharge, will hold amlodipine on discharg .  AKI on CKD IIIb - baseline appears to be ~1.8 in 03/2020, it peaked around 2.19, back to baseline on discharge  GERD (gastroesophageal reflux disease) Protonix 40 mg p.o. daily.  Mild persistent asthma Supplemental oxygen and bronchodilators as needed. Stable  Mixed hyperlipidemia Off statin. History of NASH.  Type 2 diabetes mellitus with stage 3b chronic kidney disease, with long-term current use of insulin (HCC) Continue home regimen on discharge  Obesity, Class III, BMI 40-49.9 (morbid obesity) (HCC) Lifestyle modifications. Follow-up with PCP.  Sleep apnea BiPAP at bedtime.  Discharge Diagnoses:  Principal Problem:   Sepsis due to cellulitis [facility]) Active Problems:   Essential hypertension   GERD (gastroesophageal reflux disease)   Mild persistent asthma   Mixed hyperlipidemia   Type 2 diabetes mellitus with stage 3b chronic kidney disease, with long-term current use of insulin (HCC)   Obesity, Class III, BMI 40-49.9 (morbid obesity) ([place])   Sleep apnea   Sepsis ([place])   Bacteremia due to group B Streptococcus   Chronic venous hypertension (idiopathic) with ulcer and inflammation of right lower extremity [facility])    Discharge Instructions  Discharge Instructions    Diet - low sodium heart healthy   Complete by: As directed    Increase activity slowly   Complete by: As directed    No wound care   Complete by: As  directed      Allergies as of 11/04/2020   No Known Allergies     Medication List    STOP  taking these medications   amLODipine 5 MG tablet Commonly known as: NORVASC   ciprofloxacin 500 MG tablet Commonly known as: CIPRO     TAKE these medications   amoxicillin 500 MG tablet Commonly known as: AMOXIL Take 1 tablet (500 mg total) by mouth 2 (two) times daily for 11 days.   carvedilol 25 MG tablet Commonly known as: COREG Take 25 mg by mouth 2 (two) times daily with a meal.   empagliflozin 25 MG Tabs tablet Commonly known as: JARDIANCE Take 25 mg by mouth daily.   ergocalciferol 1.25 MG (50000 UT) capsule Commonly known as: VITAMIN D2 Take 1 capsule by mouth every Monday.   Fiasp FlexTouch 100 UNIT/ML FlexTouch Pen Generic drug: insulin aspart Inject 20 Units into the skin 3 (three) times daily. Inject 20 units into the skin before each meal   HUMULIN R 500 UNIT/ML injection Generic drug: insulin regular human CONCENTRATED Inject 30 Units into the skin 3 (three) times daily with meals.   isosorbide mononitrate 30 MG 24 hr tablet Commonly known as: IMDUR Take 1 tablet (30 mg total) by mouth daily.   lisinopril-hydrochlorothiazide 20-12.5 MG tablet Commonly known as: ZESTORETIC Take 1 tablet by mouth daily.   nitroGLYCERIN 0.4 MG SL tablet Commonly known as: NITROSTAT Place 1 tablet (0.4 mg total) under the tongue every 5 (five) minutes as needed for chest pain.   rivaroxaban 20 MG Tabs tablet Commonly known as: XARELTO Take 20 mg by mouth daily with supper.   torsemide 20 MG tablet Commonly known as: DEMADEX Take 20 mg by mouth daily as needed (for swelling).   vitamin B-12 1000 MCG tablet Commonly known as: CYANOCOBALAMIN Take 1,000 mcg by mouth every Monday.       Follow-up Information    Newt Minion, MD Follow up in 1 week(s).   Specialty: Orthopedic Surgery Contact information: [place] [address] [phone]              No Known Allergies  Consultations:  Orthopedic Infectious  disease   Procedures/Studies: DG Chest 2 View  Result Date: 10/31/2020 CLINICAL DATA:  Shortness of breath. EXAM: CHEST - 2 VIEW COMPARISON:  10/30/2020.  CT 07/12/2018. FINDINGS: Mediastinum and hilar structures normal. Cardiomegaly. Low lung volumes with mild bilateral interstitial prominence. Calcified granulomas again noted. No pleural effusion or pneumothorax. IMPRESSION: Cardiomegaly with mild bilateral interstitial prominence. Mild interstitial edema cannot be excluded. Electronically Signed   By: Marcello Moores  Register   On: 10/31/2020 05:44   DG Chest Portable 1 View  Result Date: 10/30/2020 CLINICAL DATA:  Chest pain, tachycardia, diaphoresis EXAM: PORTABLE CHEST 1 VIEW COMPARISON:  04/27/2018 FINDINGS: Two frontal views of the chest demonstrate an unremarkable cardiac silhouette. No acute airspace disease, effusion, or pneumothorax. Stable bilateral calcified granuloma. No acute bony abnormalities. IMPRESSION: 1. No acute intrathoracic process. Electronically Signed   By: Randa Ngo M.D.   On: 10/30/2020 03:01   DG Foot Complete Right  Result Date: 10/30/2020 CLINICAL DATA:  Pain, puncture wound EXAM: RIGHT FOOT COMPLETE - 3+ VIEW COMPARISON:  None. FINDINGS: No fracture or malalignment. Status post amputation of first digit at the level of the IP joint. Moderate degenerative change at the first MTP joint. Tiny focus of probable gas within the mid sole of the foot presumably due to puncture wound. No periostitis or  bony destructive change. Dorsal degenerative changes of the midfoot IMPRESSION: Partial amputation of first digit.  No acute osseous abnormality. Electronically Signed   By: Donavan Foil M.D.   On: 10/30/2020 03:57   ECHOCARDIOGRAM COMPLETE  Result Date: 11/01/2020    ECHOCARDIOGRAM REPORT   Patient Name:   Sean Valentine Date of Exam: 11/01/2020 Medical Rec #:  [id number]   Height:       77.0 in Accession #:    [id number]  Weight:       380.0 lb Date of Birth:  07-22-1965   BSA:           2.938 m Patient Age:    10 years    BP:           133/69 mmHg Patient Gender: M           HR:           65 bpm. Exam Location:  Inpatient Procedure: 2D Echo, Cardiac Doppler and Color Doppler Indications:    Bacteremia  History:        Patient has no prior history of Echocardiogram examinations.                 CHF; Risk Factors:Diabetes, Hypertension and Dyslipidemia.  Sonographer:    Cammy Brochure Referring Phys: [phone] A CALDWELL POWELL JR  Sonographer Comments: Image acquisition challenging due to patient body habitus. IMPRESSIONS  1. Left ventricular ejection fraction, by estimation, is 50 to 55%. The left ventricle has low normal function. The left ventricle has no regional wall motion abnormalities. There is mild left ventricular hypertrophy. Left ventricular diastolic parameters are indeterminate. Elevated left ventricular end-diastolic pressure.  2. Right ventricular systolic function is normal. The right ventricular size is normal. Tricuspid regurgitation signal is inadequate for assessing PA pressure.  3. The mitral valve is normal in structure. Trivial mitral valve regurgitation. No evidence of mitral stenosis.  4. The aortic valve is grossly normal. Aortic valve regurgitation is not visualized. No aortic stenosis is present.  5. Aortic dilatation noted. There is mild dilatation of the aortic root, measuring 40 mm. Conclusion(s)/Recommendation(s): No evidence of valvular vegetations on this transthoracic echocardiogram. Would consider a transesophageal echocardiogram to exclude infective endocarditis if clinically indicated. FINDINGS  Left Ventricle: Left ventricular ejection fraction, by estimation, is 50 to 55%. The left ventricle has low normal function. The left ventricle has no regional wall motion abnormalities. The left ventricular internal cavity size was normal in size. There is mild left ventricular hypertrophy. Left ventricular diastolic parameters are indeterminate. Elevated left  ventricular end-diastolic pressure. Right Ventricle: The right ventricular size is normal. No increase in right ventricular wall thickness. Right ventricular systolic function is normal. Tricuspid regurgitation signal is inadequate for assessing PA pressure. Left Atrium: Left atrial size was normal in size. Right Atrium: Right atrial size was normal in size. Pericardium: Trivial pericardial effusion is present. Mitral Valve: The mitral valve is normal in structure. Trivial mitral valve regurgitation. No evidence of mitral valve stenosis. Tricuspid Valve: The tricuspid valve is normal in structure. Tricuspid valve regurgitation is trivial. No evidence of tricuspid stenosis. Aortic Valve: The aortic valve is grossly normal. Aortic valve regurgitation is not visualized. No aortic stenosis is present. Aortic valve mean gradient measures 5.0 mmHg. Aortic valve peak gradient measures 8.6 mmHg. Aortic valve area, by VTI measures 3.68 cm. Pulmonic Valve: The pulmonic valve was normal in structure. Pulmonic valve regurgitation is trivial. No evidence of pulmonic stenosis. Aorta: Aortic dilatation noted.  There is mild dilatation of the aortic root, measuring 40 mm. IAS/Shunts: No atrial level shunt detected by color flow Doppler.  LEFT VENTRICLE PLAX 2D LVIDd:         5.00 cm  Diastology LVIDs:         3.50 cm  LV e' medial:    4.35 cm/s LV PW:         1.50 cm  LV E/e' medial:  26.2 LV IVS:        1.30 cm  LV e' lateral:   6.53 cm/s LVOT diam:     2.30 cm  LV E/e' lateral: 17.5 LV SV:         116 LV SV Index:   39 LVOT Area:     4.15 cm  RIGHT VENTRICLE RV Basal diam:  3.00 cm RV S prime:     14.80 cm/s TAPSE (M-mode): 2.3 cm LEFT ATRIUM             Index       RIGHT ATRIUM           Index LA diam:        4.50 cm 1.53 cm/m  RA Area:     17.20 cm LA Vol (A2C):   65.5 ml 22.29 ml/m RA Volume:   45.90 ml  15.62 ml/m LA Vol (A4C):   78.9 ml 26.83 ml/m LA Biplane Vol: 67.9 ml 23.11 ml/m  AORTIC VALVE AV Area (Vmax):     3.42 cm AV Area (Vmean):   3.28 cm AV Area (VTI):     3.68 cm AV Vmax:           147.00 cm/s AV Vmean:          103.000 cm/s AV VTI:            0.315 m AV Peak Grad:      8.6 mmHg AV Mean Grad:      5.0 mmHg LVOT Vmax:         121.00 cm/s LVOT Vmean:        81.200 cm/s LVOT VTI:          0.279 m LVOT/AV VTI ratio: 0.89  AORTA Ao Root diam: 4.00 cm Ao Asc diam:  3.50 cm MITRAL VALVE MV Area (PHT): 5.02 cm     SHUNTS MV Decel Time: 151 msec     Systemic VTI:  0.28 m MV E velocity: 114.00 cm/s  Systemic Diam: 2.30 cm MV A velocity: 109.00 cm/s MV E/A ratio:  1.05 Cherlynn Kaiser MD Electronically signed by Cherlynn Kaiser MD Signature Date/Time: 11/01/2020/4:06:37 PM    Final    [place] RT LOWER EXTREM LTD SOFT TISSUE NON VASCULAR  Result Date: 11/01/2020 CLINICAL DATA:  Puncture wound right foot, cellulitis EXAM: ULTRASOUND RIGHT LOWER EXTREMITY LIMITED TECHNIQUE: Ultrasound examination of the lower extremity soft tissues was performed in the area of clinical concern. COMPARISON:  10/30/2020 FINDINGS: Sonographic evaluation of the plantar aspect of the right forefoot was performed at the site of the puncture wound. There is a 6 mm fluid collection deep to the puncture wound, located approximately 10 mm deep to the skin surface, with small tract extending toward the skin surface. Superficial subcutaneous edema is identified. I do not see any foreign body. IMPRESSION: 1. 6 mm fluid collection in the plantar right forefoot at the site of the puncture wound. This is located approximately 10 mm deep to the skin surface. 2. No evidence of foreign body. Electronically Signed  By: Randa Ngo M.D.   On: 11/01/2020 23:47   ECHO TEE  Result Date: 11/03/2020    TRANSESOPHOGEAL ECHO REPORT   Patient Name:   Sean Valentine Date of Exam: 11/03/2020 Medical Rec #:  [id number]   Height:       77.0 in Accession #:    [id number]  Weight:       380.0 lb Date of Birth:  05-Oct-1965   BSA:          2.938 m Patient Age:    89 years    BP:            168/84 mmHg Patient Gender: M           HR:           79 bpm. Exam Location:  Inpatient Procedure: 2D Echo, Cardiac Doppler and Color Doppler Indications:     Bacteremia  History:         Patient has prior history of Echocardiogram examinations, most                  recent 11/01/2020. CHF; Risk Factors:Hypertension, Diabetes and                  Dyslipidemia.  Sonographer:     Clayton Lefort RDCS (AE) Referring Phys:  [id number] Tami Lin [facility] Diagnosing Phys: Eleonore Chiquito MD PROCEDURE: After discussion of the risks and benefits of a TEE, an informed consent was obtained from the patient. TEE procedure time was 14 minutes. The transesophogeal probe was passed without difficulty through the esophogus of the patient. Imaged were obtained with the patient in a left lateral decubitus position. Local oropharyngeal anesthetic was provided with Cetacaine. Sedation performed by different physician. The patient was monitored while under deep sedation. Anesthestetic sedation was provided intravenously by Anesthesiology: 360mg  of Propofol. Image quality was excellent. The patient's vital signs; including heart rate, blood pressure, and oxygen saturation; remained stable throughout the procedure. The patient developed no complications during the procedure. IMPRESSIONS  1. No evidence of endocarditis.  2. Left ventricular ejection fraction, by estimation, is 60 to 65%. The left ventricle has normal function.  3. Right ventricular systolic function is normal. The right ventricular size is normal.  4. No left atrial/left atrial appendage thrombus was detected. The LAA emptying velocity was 62 cm/s.  5. 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 Bubble study negative for PFO. Agitated saline contrast bubble study was negative, with no evidence of any interatrial shunt.  6. A small pericardial effusion is present. The pericardial effusion is circumferential.  7. The mitral valve is normal in structure. Trivial  mitral valve regurgitation. No evidence of mitral stenosis.  8. The aortic valve is tricuspid. Aortic valve regurgitation is not visualized. No aortic stenosis is present. Conclusion(s)/Recommendation(s): No evidence of vegetation/infective endocarditis on this transesophageal echocardiogram. FINDINGS  Left Ventricle: Left ventricular ejection fraction, by estimation, is 60 to 65%. The left ventricle has normal function. The left ventricular internal cavity size was normal in size. Right Ventricle: The right ventricular size is normal. No increase in right ventricular wall thickness. Right ventricular systolic function is normal. Left Atrium: Left atrial size was normal in size. No left atrial/left atrial appendage thrombus was detected. The LAA emptying velocity was 62 cm/s. Right Atrium: Right atrial size was normal in size. Pericardium: A small pericardial effusion is present. The pericardial effusion is circumferential. Mitral Valve: The mitral valve is normal in structure. Trivial mitral valve  regurgitation. No evidence of mitral valve stenosis. There is no evidence of mitral valve vegetation. Tricuspid Valve: The tricuspid valve is normal in structure. Tricuspid valve regurgitation is trivial. No evidence of tricuspid stenosis. There is no evidence of tricuspid valve vegetation. Aortic Valve: The aortic valve is tricuspid. Aortic valve regurgitation is not visualized. No aortic stenosis is present. Aortic valve mean gradient measures 4.8 mmHg. Aortic valve peak gradient measures 8.8 mmHg. Aortic valve area, by VTI measures 4.40 cm. There is no evidence of aortic valve vegetation. Pulmonic Valve: The pulmonic valve was normal in structure. Pulmonic valve regurgitation is trivial. No evidence of pulmonic stenosis. There is no evidence of pulmonic valve vegetation. Aorta: The aortic root and ascending aorta are structurally normal, with no evidence of dilitation. There is minimal (Grade I) layered plaque  involving the descending aorta. Venous: The left upper pulmonary vein, left lower pulmonary vein, right upper pulmonary vein and right lower pulmonary vein are normal. IAS/Shunts: The interatrial septum is aneurysmal. The atrial septum is grossly normal. Agitated saline contrast was given intravenously to evaluate for intracardiac shunting. Agitated saline contrast bubble study was negative, with no evidence of any interatrial shunt. 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 Bubble study negative for PFO.  LEFT VENTRICLE PLAX 2D LVOT diam:     2.50 cm LV SV:         137 LV SV Index:   47 LVOT Area:     4.91 cm  AORTIC VALVE AV Area (Vmax):    4.14 cm AV Area (Vmean):   4.25 cm AV Area (VTI):     4.40 cm AV Vmax:           148.08 cm/s AV Vmean:          103.139 cm/s AV VTI:            0.310 m AV Peak Grad:      8.8 mmHg AV Mean Grad:      4.8 mmHg LVOT Vmax:         124.89 cm/s LVOT Vmean:        89.375 cm/s LVOT VTI:          0.279 m LVOT/AV VTI ratio: 0.90  AORTA Ao Root diam: 3.56 cm Ao Asc diam:  3.30 cm TRICUSPID VALVE TR Peak grad:   3.5 mmHg TR Vmax:        94.00 cm/s  SHUNTS Systemic VTI:  0.28 m Systemic Diam: 2.50 cm Eleonore Chiquito MD Electronically signed by Eleonore Chiquito MD Signature Date/Time: 11/03/2020/2:41:03 PM    Final (Updated)        Subjective:  Denies any fever, chills, leg pain, he is eager to go home. Discharge Exam: Vitals:   11/03/20 2037 11/04/20 0415  BP:  (!) 156/77  Pulse: 71 60  Resp: 17 16  Temp:  97.8 F (36.6 C)  SpO2: 95% 97%   Vitals:   11/03/20 1331 11/03/20 2005 11/03/20 2037 11/04/20 0415  BP: (!) 168/84 138/60  (!) 156/77  Pulse: 66 69 71 60  Resp: 20 18 17 16   Temp: 97.8 F (36.6 C) 98.4 F (36.9 C)  97.8 F (36.6 C)  TempSrc: Oral Oral  Oral  SpO2: 97% 93% 95% 97%  Weight:      Height:        General: Pt is alert, awake, not in acute distress Cardiovascular: RRR, S1/S2 +, no rubs, no gallops Respiratory: CTA bilaterally,  no wheezing, no rhonchi Abdominal:  Soft, NT, ND, bowel sounds + Extremities: no edema, lower extremity chronic skin wound with no discharge, or cellulitis    The results of significant diagnostics from this hospitalization (including imaging, microbiology, ancillary and laboratory) are listed below for reference.     Microbiology: Recent Results (from the past 240 hour(s))  Blood Culture (routine x 2)     Status: Abnormal   Collection Time: 10/30/20  2:57 AM   Specimen: BLOOD  Result Value Ref Range Status   Specimen Description BLOOD RIGHT ANTECUBITAL  Final   Special Requests   Final    BOTTLES DRAWN AEROBIC AND ANAEROBIC Blood Culture adequate volume   Culture  Setup Time   Final    GRAM POSITIVE COCCI IN CHAINS IN BOTH AEROBIC AND ANAEROBIC BOTTLES CRITICAL RESULT CALLED TO, READ BACK BY AND VERIFIED WITH: Lafe Garin [phone] FCP Performed at [facility], [place] [address] [place], [place] [address]    Culture GROUP B STREP(S.AGALACTIAE)ISOLATED (A)  Final   Report Status 11/01/2020 FINAL  Final   Organism ID, Bacteria GROUP B STREP(S.AGALACTIAE)ISOLATED  Final      Susceptibility   Group b strep(s.agalactiae)isolated - MIC*    CLINDAMYCIN >=1 RESISTANT Resistant     AMPICILLIN <=0.25 SENSITIVE Sensitive     ERYTHROMYCIN >=8 RESISTANT Resistant     VANCOMYCIN 0.5 SENSITIVE Sensitive     CEFTRIAXONE <=0.12 SENSITIVE Sensitive     LEVOFLOXACIN 1 SENSITIVE Sensitive     * GROUP B STREP(S.AGALACTIAE)ISOLATED  Blood Culture ID Panel (Reflexed)     Status: Abnormal   Collection Time: 10/30/20  2:57 AM  Result Value Ref Range Status   Enterococcus faecalis NOT DETECTED NOT DETECTED Final   Enterococcus Faecium NOT DETECTED NOT DETECTED Final   Listeria monocytogenes NOT DETECTED NOT DETECTED Final   Staphylococcus species NOT DETECTED NOT DETECTED Final   Staphylococcus aureus (BCID) NOT DETECTED NOT DETECTED Final   Staphylococcus epidermidis NOT DETECTED NOT  DETECTED Final   Staphylococcus lugdunensis NOT DETECTED NOT DETECTED Final   Streptococcus species DETECTED (A) NOT DETECTED Final    Comment: CRITICAL RESULT CALLED TO, READ BACK BY AND VERIFIED WITH: PHARMD FRANK W. [phone] FCP    Streptococcus agalactiae DETECTED (A) NOT DETECTED Final    Comment: CRITICAL RESULT CALLED TO, READ BACK BY AND VERIFIED WITH: PHARMD FRANK W. [phone] FCP    Streptococcus pneumoniae NOT DETECTED NOT DETECTED Final   Streptococcus pyogenes NOT DETECTED NOT DETECTED Final   A.calcoaceticus-baumannii NOT DETECTED NOT DETECTED Final   Bacteroides fragilis NOT DETECTED NOT DETECTED Final   Enterobacterales NOT DETECTED NOT DETECTED Final   Enterobacter cloacae complex NOT DETECTED NOT DETECTED Final   Escherichia coli NOT DETECTED NOT DETECTED Final   Klebsiella aerogenes NOT DETECTED NOT DETECTED Final   Klebsiella oxytoca NOT DETECTED NOT DETECTED Final   Klebsiella pneumoniae NOT DETECTED NOT DETECTED Final   Proteus species NOT DETECTED NOT DETECTED Final   Salmonella species NOT DETECTED NOT DETECTED Final   Serratia marcescens NOT DETECTED NOT DETECTED Final   Haemophilus influenzae NOT DETECTED NOT DETECTED Final   Neisseria meningitidis NOT DETECTED NOT DETECTED Final   Pseudomonas aeruginosa NOT DETECTED NOT DETECTED Final   Stenotrophomonas maltophilia NOT DETECTED NOT DETECTED Final   Candida albicans NOT DETECTED NOT DETECTED Final   Candida auris NOT DETECTED NOT DETECTED Final   Candida glabrata NOT DETECTED NOT DETECTED Final   Candida krusei NOT DETECTED NOT DETECTED Final  Candida parapsilosis NOT DETECTED NOT DETECTED Final   Candida tropicalis NOT DETECTED NOT DETECTED Final   Cryptococcus neoformans/gattii NOT DETECTED NOT DETECTED Final    Comment: Performed at [facility], [address] [place], [place] [address]  Blood Culture (routine x 2)     Status: Abnormal   Collection Time: 10/30/20  3:50 AM   Specimen:  BLOOD RIGHT FOREARM  Result Value Ref Range Status   Specimen Description BLOOD RIGHT FOREARM  Final   Special Requests   Final    BOTTLES DRAWN AEROBIC AND ANAEROBIC Blood Culture results may not be optimal due to an inadequate volume of blood received in culture bottles   Culture  Setup Time   Final    GRAM POSITIVE COCCI IN CHAINS IN BOTH AEROBIC AND ANAEROBIC BOTTLES    Culture (A)  Final    GROUP B STREP(S.AGALACTIAE)ISOLATED SUSCEPTIBILITIES PERFORMED ON PREVIOUS CULTURE WITHIN THE LAST 5 DAYS. Performed at [facility], [place] [address] [place], [place] [address]    Report Status 11/01/2020 FINAL  Final  Resp Panel by RT-PCR (Flu A&B, Covid) Nasopharyngeal Swab     Status: None   Collection Time: 10/30/20  3:57 AM   Specimen: Nasopharyngeal Swab; Nasopharyngeal(NP) swabs in vial transport medium  Result Value Ref Range Status   SARS Coronavirus 2 by RT PCR NEGATIVE NEGATIVE Final    Comment: (NOTE) SARS-CoV-2 target nucleic acids are NOT DETECTED.  The SARS-CoV-2 RNA is generally detectable in upper respiratory specimens during the acute phase of infection. The lowest concentration of SARS-CoV-2 viral copies this assay can detect is 138 copies/mL. A negative result does not preclude SARS-Cov-2 infection and should not be used as the sole basis for treatment or other patient management decisions. A negative result may occur with  improper specimen collection/handling, submission of specimen other than nasopharyngeal swab, presence of viral mutation(s) within the areas targeted by this assay, and inadequate number of viral copies(<138 copies/mL). A negative result must be combined with clinical observations, patient history, and epidemiological information. The expected result is Negative.  Fact Sheet for Patients:  EntrepreneurPulse.com.au  Fact Sheet for Healthcare Providers:  IncredibleEmployment.be  This test is no t yet  approved or cleared by the [place] FDA and  has been authorized for detection and/or diagnosis of SARS-CoV-2 by FDA under an Emergency Use Authorization (EUA). This EUA will remain  in effect (meaning this test can be used) for the duration of the COVID-19 declaration under Section 564(b)(1) of the Act, 21 U.S.C.section 360bbb-3(b)(1), unless the authorization is terminated  or revoked sooner.       Influenza A by PCR NEGATIVE NEGATIVE Final   Influenza B by PCR NEGATIVE NEGATIVE Final    Comment: (NOTE) The Xpert Xpress SARS-CoV-2/FLU/RSV plus assay is intended as an aid in the diagnosis of influenza from Nasopharyngeal swab specimens and should not be used as a sole basis for treatment. Nasal washings and aspirates are unacceptable for Xpert Xpress SARS-CoV-2/FLU/RSV testing.  Fact Sheet for Patients: EntrepreneurPulse.com.au  Fact Sheet for Healthcare Providers: IncredibleEmployment.be  This test is not yet approved or cleared by the [place] FDA and has been authorized for detection and/or diagnosis of SARS-CoV-2 by FDA under an Emergency Use Authorization (EUA). This EUA will remain in effect (meaning this test can be used) for the duration of the COVID-19 declaration under Section 564(b)(1) of the Act, 21 U.S.C. section 360bbb-3(b)(1), unless the authorization is terminated or revoked.  Performed at [facility]  [facility], [place] [address], [place], [place] [address]   Culture, blood (routine x 2)     Status: None (Preliminary result)   Collection Time: 10/31/20  8:19 PM   Specimen: BLOOD  Result Value Ref Range Status   Specimen Description BLOOD LEFT ANTECUBITAL  Final   Special Requests   Final    BOTTLES DRAWN AEROBIC AND ANAEROBIC Blood Culture adequate volume   Culture   Final    NO GROWTH 3 DAYS Performed at [facility], [place] [address] [place], [place] [address]    Report Status PENDING  Incomplete  Culture,  blood (routine x 2)     Status: None (Preliminary result)   Collection Time: 10/31/20  8:22 PM   Specimen: BLOOD LEFT HAND  Result Value Ref Range Status   Specimen Description BLOOD LEFT HAND  Final   Special Requests   Final    BOTTLES DRAWN AEROBIC AND ANAEROBIC Blood Culture adequate volume   Culture   Final    NO GROWTH 3 DAYS Performed at [facility], [place] [address] [place], [place] [address]    Report Status PENDING  Incomplete     Labs: BNP (last 3 results) Recent Labs    10/30/20 0227  BNP 99.3   Basic Metabolic Panel: Recent Labs  Lab 10/30/20 0226 10/31/20 0021 11/02/20 0030 11/03/20 0358 11/04/20 0103  NA 138 134* 136 139 138  K 3.5 3.3* 3.3* 3.8 3.6  CL 103 101 99 103 105  CO2 24 25 28 27 24   GLUCOSE 334* 277* 200* 199* 190*  BUN 37* 28* 33* 33* 26*  CREATININE 2.07* 2.19* 2.04* 2.11* 1.89*  CALCIUM 8.7* 8.3* 8.7* 9.0 8.6*  MG 1.7  --  1.9 2.0  --   PHOS  --   --  2.9 3.5  --    Liver Function Tests: Recent Labs  Lab 10/30/20 0226 10/31/20 0021 11/02/20 0030 11/03/20 0358  AST 24 15 16 17   ALT 24 19 18 22   ALKPHOS 52 43 40 42  BILITOT 0.8 0.9 0.7 0.5  PROT 6.5 5.8* 6.1* 6.1*  ALBUMIN 2.9* 2.5* 2.4* 2.5*   No results for input(s): LIPASE, AMYLASE in the last 168 hours. No results for input(s): AMMONIA in the last 168 hours. CBC: Recent Labs  Lab 10/31/20 0021 11/01/20 0156 11/02/20 0030 11/03/20 0358 11/04/20 0103  WBC 10.3 7.6 7.2 6.6 9.6  NEUTROABS 8.4* 4.2 4.2 3.8 6.5  HGB 11.6* 11.7* 11.8* 11.7* 11.8*  HCT 34.3* 34.2* 34.0* 34.6* 34.9*  MCV 95.5 95.8 94.7 95.6 94.8  PLT 182 165 187 201 209   Cardiac Enzymes: No results for input(s): CKTOTAL, CKMB, CKMBINDEX, TROPONINI in the last 168 hours. BNP: Invalid input(s): POCBNP CBG: Recent Labs  Lab 10/30/20 1717 10/30/20 2020 11/03/20 2009  GLUCAP 341* 318* 204*   D-Dimer No results for input(s): DDIMER in the last 72 hours. Hgb A1c Recent Labs    11/01/20 1807   HGBA1C 10.5*   Lipid Profile No results for input(s): CHOL, HDL, LDLCALC, TRIG, CHOLHDL, LDLDIRECT in the last 72 hours. Thyroid function studies No results for input(s): TSH, T4TOTAL, T3FREE, THYROIDAB in the last 72 hours.  Invalid input(s): FREET3 Anemia work up No results for input(s): VITAMINB12, FOLATE, FERRITIN, TIBC, IRON, RETICCTPCT in the last 72 hours. Urinalysis    Component Value Date/Time   COLORURINE STRAW (A) 10/30/2020 0215   APPEARANCEUR CLEAR 10/30/2020 0215   LABSPEC 1.015 10/30/2020 0215  PHURINE 6.0 10/30/2020 0215   GLUCOSEU >=500 (A) 10/30/2020 0215   HGBUR SMALL (A) 10/30/2020 0215   BILIRUBINUR NEGATIVE 10/30/2020 0215   KETONESUR 5 (A) 10/30/2020 0215   PROTEINUR >=300 (A) 10/30/2020 0215   NITRITE NEGATIVE 10/30/2020 0215   LEUKOCYTESUR NEGATIVE 10/30/2020 0215   Sepsis Labs Invalid input(s): PROCALCITONIN,  WBC,  LACTICIDVEN Microbiology Recent Results (from the past 240 hour(s))  Blood Culture (routine x 2)     Status: Abnormal   Collection Time: 10/30/20  2:57 AM   Specimen: BLOOD  Result Value Ref Range Status   Specimen Description BLOOD RIGHT ANTECUBITAL  Final   Special Requests   Final    BOTTLES DRAWN AEROBIC AND ANAEROBIC Blood Culture adequate volume   Culture  Setup Time   Final    GRAM POSITIVE COCCI IN CHAINS IN BOTH AEROBIC AND ANAEROBIC BOTTLES CRITICAL RESULT CALLED TO, READ BACK BY AND VERIFIED WITH: Lafe Garin [phone] FCP Performed at [facility], [place] [address], [place], [place] [address]    Culture GROUP B STREP(S.AGALACTIAE)ISOLATED (A)  Final   Report Status 11/01/2020 FINAL  Final   Organism ID, Bacteria GROUP B STREP(S.AGALACTIAE)ISOLATED  Final      Susceptibility   Group b strep(s.agalactiae)isolated - MIC*    CLINDAMYCIN >=1 RESISTANT Resistant     AMPICILLIN <=0.25 SENSITIVE Sensitive     ERYTHROMYCIN >=8 RESISTANT Resistant     VANCOMYCIN 0.5 SENSITIVE Sensitive     CEFTRIAXONE <=0.12  SENSITIVE Sensitive     LEVOFLOXACIN 1 SENSITIVE Sensitive     * GROUP B STREP(S.AGALACTIAE)ISOLATED  Blood Culture ID Panel (Reflexed)     Status: Abnormal   Collection Time: 10/30/20  2:57 AM  Result Value Ref Range Status   Enterococcus faecalis NOT DETECTED NOT DETECTED Final   Enterococcus Faecium NOT DETECTED NOT DETECTED Final   Listeria monocytogenes NOT DETECTED NOT DETECTED Final   Staphylococcus species NOT DETECTED NOT DETECTED Final   Staphylococcus aureus (BCID) NOT DETECTED NOT DETECTED Final   Staphylococcus epidermidis NOT DETECTED NOT DETECTED Final   Staphylococcus lugdunensis NOT DETECTED NOT DETECTED Final   Streptococcus species DETECTED (A) NOT DETECTED Final    Comment: CRITICAL RESULT CALLED TO, READ BACK BY AND VERIFIED WITH: PHARMD FRANK W. [phone] FCP    Streptococcus agalactiae DETECTED (A) NOT DETECTED Final    Comment: CRITICAL RESULT CALLED TO, READ BACK BY AND VERIFIED WITH: PHARMD FRANK W. [phone] FCP    Streptococcus pneumoniae NOT DETECTED NOT DETECTED Final   Streptococcus pyogenes NOT DETECTED NOT DETECTED Final   A.calcoaceticus-baumannii NOT DETECTED NOT DETECTED Final   Bacteroides fragilis NOT DETECTED NOT DETECTED Final   Enterobacterales NOT DETECTED NOT DETECTED Final   Enterobacter cloacae complex NOT DETECTED NOT DETECTED Final   Escherichia coli NOT DETECTED NOT DETECTED Final   Klebsiella aerogenes NOT DETECTED NOT DETECTED Final   Klebsiella oxytoca NOT DETECTED NOT DETECTED Final   Klebsiella pneumoniae NOT DETECTED NOT DETECTED Final   Proteus species NOT DETECTED NOT DETECTED Final   Salmonella species NOT DETECTED NOT DETECTED Final   Serratia marcescens NOT DETECTED NOT DETECTED Final   Haemophilus influenzae NOT DETECTED NOT DETECTED Final   Neisseria meningitidis NOT DETECTED NOT DETECTED Final   Pseudomonas aeruginosa NOT DETECTED NOT DETECTED Final   Stenotrophomonas maltophilia NOT DETECTED NOT DETECTED Final    Candida albicans NOT DETECTED NOT DETECTED Final   Candida auris NOT DETECTED NOT DETECTED Final   Candida  glabrata NOT DETECTED NOT DETECTED Final   Candida krusei NOT DETECTED NOT DETECTED Final   Candida parapsilosis NOT DETECTED NOT DETECTED Final   Candida tropicalis NOT DETECTED NOT DETECTED Final   Cryptococcus neoformans/gattii NOT DETECTED NOT DETECTED Final    Comment: Performed at [facility], [place] [address] [place], [place] [address]  Blood Culture (routine x 2)     Status: Abnormal   Collection Time: 10/30/20  3:50 AM   Specimen: BLOOD RIGHT FOREARM  Result Value Ref Range Status   Specimen Description BLOOD RIGHT FOREARM  Final   Special Requests   Final    BOTTLES DRAWN AEROBIC AND ANAEROBIC Blood Culture results may not be optimal due to an inadequate volume of blood received in culture bottles   Culture  Setup Time   Final    GRAM POSITIVE COCCI IN CHAINS IN BOTH AEROBIC AND ANAEROBIC BOTTLES    Culture (A)  Final    GROUP B STREP(S.AGALACTIAE)ISOLATED SUSCEPTIBILITIES PERFORMED ON PREVIOUS CULTURE WITHIN THE LAST 5 DAYS. Performed at [facility], [place] [address] [place],  [address]    Report Status 11/01/2020 FINAL  Final  Resp Panel by RT-PCR (Flu A&B, Covid) Nasopharyngeal Swab     Status: None   Collection Time: 10/30/20  3:57 AM   Specimen: Nasopharyngeal Swab; Nasopharyngeal(NP) swabs in vial transport medium  Result Value Ref Range Status   SARS Coronavirus 2 by RT PCR NEGATIVE NEGATIVE Final    Comment: (NOTE) SARS-CoV-2 target nucleic acids are NOT DETECTED.  The SARS-CoV-2 RNA is generally detectable in upper respiratory specimens during the acute phase of infection. The lowest concentration of SARS-CoV-2 viral copies this assay can detect is 138 copies/mL. A negative result does not preclude SARS-Cov-2 infection and should not be used as the sole basis for treatment or other patient management decisions. A negative result may  occur with  improper specimen collection/handling, submission of specimen other than nasopharyngeal swab, presence of viral mutation(s) within the areas targeted by this assay, and inadequate number of viral copies(<138 copies/mL). A negative result must be combined with clinical observations, patient history, and epidemiological information. The expected result is Negative.  Fact Sheet for Patients:  EntrepreneurPulse.com.au  Fact Sheet for Healthcare Providers:  IncredibleEmployment.be  This test is no t yet approved or cleared by the [place] FDA and  has been authorized for detection and/or diagnosis of SARS-CoV-2 by FDA under an Emergency Use Authorization (EUA). This EUA will remain  in effect (meaning this test can be used) for the duration of the COVID-19 declaration under Section 564(b)(1) of the Act, 21 U.S.C.section 360bbb-3(b)(1), unless the authorization is terminated  or revoked sooner.       Influenza A by PCR NEGATIVE NEGATIVE Final   Influenza B by PCR NEGATIVE NEGATIVE Final    Comment: (NOTE) The Xpert Xpress SARS-CoV-2/FLU/RSV plus assay is intended as an aid in the diagnosis of influenza from Nasopharyngeal swab specimens and should not be used as a sole basis for treatment. Nasal washings and aspirates are unacceptable for Xpert Xpress SARS-CoV-2/FLU/RSV testing.  Fact Sheet for Patients: EntrepreneurPulse.com.au  Fact Sheet for Healthcare Providers: IncredibleEmployment.be  This test is not yet approved or cleared by the [place] FDA and has been authorized for detection and/or diagnosis of SARS-CoV-2 by FDA under an Emergency Use Authorization (EUA). This EUA will remain in effect (meaning this test can be used) for the duration of the COVID-19 declaration under Section 564(b)(1) of the  Act, 21 U.S.C. section 360bbb-3(b)(1), unless the authorization is terminated  or revoked.  Performed at [facility], [place] [address] [place], [place] [address]   Culture, blood (routine x 2)     Status: None (Preliminary result)   Collection Time: 10/31/20  8:19 PM   Specimen: BLOOD  Result Value Ref Range Status   Specimen Description BLOOD LEFT ANTECUBITAL  Final   Special Requests   Final    BOTTLES DRAWN AEROBIC AND ANAEROBIC Blood Culture adequate volume   Culture   Final    NO GROWTH 3 DAYS Performed at [facility], [place] [address], [place], [place] [address]    Report Status PENDING  Incomplete  Culture, blood (routine x 2)     Status: None (Preliminary result)   Collection Time: 10/31/20  8:22 PM   Specimen: BLOOD LEFT HAND  Result Value Ref Range Status   Specimen Description BLOOD LEFT HAND  Final   Special Requests   Final    BOTTLES DRAWN AEROBIC AND ANAEROBIC Blood Culture adequate volume   Culture   Final    NO GROWTH 3 DAYS Performed at [facility], [place] [address] [place], [place] [address]    Report Status PENDING  Incomplete     Time coordinating discharge: Over 30 minutes  SIGNED:   Phillips Climes, MD  [facility] 11/04/2020, 12:29 PM Pager   If 7PM-7AM, please contact night-coverage [email or web address] Password [email or web address]

## 2020-11-04 NOTE — Progress Notes (Signed)
Patient ID: Sean Valentine, male   DOB: 1965/12/04, 55 y.o.   MRN: [id number] Patient is awaiting the compression stocking for the right leg with the venous insufficiency ulcers.  Anticipate patient can discharge to home once he has the compression stocking.  He is to change this daily I will follow-up in the office in 1 week.

## 2020-11-04 NOTE — Progress Notes (Signed)
Brief ID Note:  Patient being followed this hospitalization by my partner, Dr West Bali.  He was seen yesterday by orthopedics with no current plans for intervention.  Awaiting compression stockings and then possible discharge home with orthopedic follow up in 1 week.  Will recommend transition to amoxicillin as previously noted by Dr West Bali to complete 2 week course of therapy from negative blood cultures 10/31/20.  End date = 11/15/20.  Follow up with Dr West Bali arranged for that date as well in case therapy needs extension.  Will sign off, please call as needed.    Raynelle Highland for [facility] 11/04/2020, 9:46 AM

## 2020-11-05 LAB — CULTURE, BLOOD (ROUTINE X 2)
Culture: NO GROWTH
Culture: NO GROWTH
Special Requests: ADEQUATE
Special Requests: ADEQUATE

## 2020-11-06 ENCOUNTER — Encounter (HOSPITAL_COMMUNITY): Payer: Self-pay | Admitting: Cardiovascular Disease

## 2020-11-07 ENCOUNTER — Ambulatory Visit (INDEPENDENT_AMBULATORY_CARE_PROVIDER_SITE_OTHER): Payer: PRIVATE HEALTH INSURANCE | Admitting: Cardiology

## 2020-11-07 ENCOUNTER — Other Ambulatory Visit: Payer: Self-pay

## 2020-11-07 VITALS — BP 170/86 | HR 71 | Ht 77.0 in | Wt 382.0 lb

## 2020-11-07 DIAGNOSIS — Z7901 Long term (current) use of anticoagulants: Secondary | ICD-10-CM

## 2020-11-07 DIAGNOSIS — R7881 Bacteremia: Secondary | ICD-10-CM

## 2020-11-07 DIAGNOSIS — I10 Essential (primary) hypertension: Secondary | ICD-10-CM | POA: Diagnosis not present

## 2020-11-07 DIAGNOSIS — R0789 Other chest pain: Secondary | ICD-10-CM

## 2020-11-07 DIAGNOSIS — I5032 Chronic diastolic (congestive) heart failure: Secondary | ICD-10-CM | POA: Diagnosis not present

## 2020-11-07 DIAGNOSIS — B951 Streptococcus, group B, as the cause of diseases classified elsewhere: Secondary | ICD-10-CM

## 2020-11-07 NOTE — Progress Notes (Signed)
Cardiology Office Note:    Date:  11/07/2020   ID:  Sean Valentine, DOB Dec 01, 1965, MRN [id number]  PCP:  Raina Mina., MD  Cardiologist:  Jenne Campus, MD    Referring MD: Raina Mina., MD   Chief Complaint  Patient presents with  . Follow-up    History of Present Illness:    Sean Valentine is a 55 y.o. male with past medical history significant for diabetes mellitus, essential hypertension, obstructive sleep apnea, hemochromatosis, morbid obesity, he was referred to [place] because of atypical chest pain eventually end up doing stress test stress test showed no evidence of ischemia.  About 2 weeks ago he ended up coming to hospital because of infection.  He did have strep sepsis as a part of evaluation he did have transesophageal echocardiogram done which showed no evidence of endocarditis but he was noted to have intra-atrial septal aneurysm without any shunting. He said he is feeling better denies have any chest pain tightness squeezing pressure burning chest.  He is still not active he works in [employer] but sitting at the desk but from time to time he tried to go to build up some stamina.  Overall cardiac wise seems to be doing well.  Past Medical History:  Diagnosis Date  . Anemia, chronic disease 05/26/2018   Formatting of this note might be different from the original. Eval with [facility] and phlebotomy for Hemochromotosis.  . Anticoagulant long-term use 11/26/2016  . Arthritis of right foot 05/08/2016  . Atypical chest pain 09/13/2020  . Benign hypertension with CKD (chronic kidney disease) stage III ([place]) 07/12/2019  . Bilateral leg edema   . Chest pain at rest 08/16/2020  . CHF (congestive heart failure) ([place]) 03/05/2012  . COVID-19 virus infection 02/09/2020  . Deformity of toe 08/01/2020  . Diabetic ulcer of toe of right foot associated with type 2 diabetes mellitus, with fat layer exposed ([place]) 01/10/2020   Formatting of this note might be different from the original. Distal tuft  right fifth toe  . Dyslipidemia 03/05/2012  . Essential hypertension 11/26/2016  . GERD (gastroesophageal reflux disease) 08/01/2020  . Gouty arthritis of left hand 01/15/2017  . High risk medication use 11/26/2016  . History of COVID-19 05/05/2020   Formatting of this note might be different from the original. 02/2020  . History of pulmonary embolism 11/26/2016   Formatting of this note might be different from the original. Felt severe with permanent anticoagulation and Filter placed because of burden.  . Hypertension   . Localized edema 03/05/2012  . LVH (left ventricular hypertrophy) 05/26/2018   Formatting of this note might be different from the original. 04/2018 EF 555-60%  . Malaise and fatigue 11/26/2016  . MGUS (monoclonal gammopathy of unknown significance) 06/03/2018   Formatting of this note might be different from the original. Possible. Seen on SPEP 05/2018  . Mild persistent asthma 11/26/2016  . Mixed hyperlipidemia 11/26/2016  . Morbid (severe) obesity due to excess calories ([place]) 03/05/2012  . Morbid obesity with body mass index (BMI) of 40.0 to 49.9 ([place]) 11/26/2016  . Nail dystrophy 05/20/2018  . Nephrolithiasis 03/05/2012  . Nonalcoholic steatohepatitis (NASH) 05/26/2018  . Obesity, Class III, BMI 40-49.9 (morbid obesity) ([place]) 11/26/2016  . Other hemochromatosis 03/21/2020  . Posterior tibial tendinitis of right lower extremity 08/01/2020  . Pre-ulcerative calluses 06/18/2019  . Primary central sleep apnea 11/26/2016   Formatting of this note might be different from the original. bipap  . Sepsis ([place]) 10/30/2020  .  Sepsis due to cellulitis ([place]) 10/30/2020  . Sleep apnea 03/05/2012   Formatting of this note might be different from the original. [place].  Marland Kitchen Spinal stenosis of lumbar region 05/26/2018  . Status post amputation of toe of right foot ([place]) 07/26/2015  . Subfibular impingement, right 03/13/2016  . Tinea pedis 08/01/2020  . Type 2 diabetes mellitus with stage 3b chronic kidney  disease, with long-term current use of insulin ([place]) 07/12/2019   Formatting of this note might be different from the original. IMO 10/01 Updates  Formatting of this note might be different from the original. 2000    Past Surgical History:  Procedure Laterality Date  . BUBBLE STUDY  11/03/2020   Procedure: BUBBLE STUDY;  Surgeon: Geralynn Rile, MD;  Location: [facility];  Service: Cardiovascular;;  . CHOLECYSTECTOMY    . CYSTOSCOPY W/ URETERAL STENT PLACEMENT Left 05/06/2005  . FOOT SURGERY Right    Big toe amputation  . IVC FILTER INSERTION  08/2016  . KIDNEY STONE SURGERY    . SHOULDER SURGERY Left   . TEE WITHOUT CARDIOVERSION N/A 11/03/2020   Procedure: TRANSESOPHAGEAL ECHOCARDIOGRAM (TEE);  Surgeon: Geralynn Rile, MD;  Location: [facility];  Service: Cardiovascular;  Laterality: N/A;    Current Medications: Current Meds  Medication Sig  . amoxicillin (AMOXIL) 500 MG tablet Take 1 tablet (500 mg total) by mouth 2 (two) times daily for 11 days.  . carvedilol (COREG) 25 MG tablet Take 25 mg by mouth 2 (two) times daily with a meal.  . empagliflozin (JARDIANCE) 25 MG TABS tablet Take 25 mg by mouth daily.  . ergocalciferol (VITAMIN D2) 1.25 MG (50000 UT) capsule Take 1 capsule by mouth every Monday.  . fluconazole (DIFLUCAN) 100 MG tablet Take 100 mg by mouth daily.  . insulin aspart (FIASP FLEXTOUCH) 100 UNIT/ML FlexTouch Pen Inject 20 Units into the skin 3 (three) times daily. Inject 20 units into the skin before each meal  . insulin regular human CONCENTRATED (HUMULIN R) 500 UNIT/ML injection Inject 30 Units into the skin 3 (three) times daily with meals.  . isosorbide mononitrate (IMDUR) 30 MG 24 hr tablet Take 1 tablet (30 mg total) by mouth daily.  Marland Kitchen lisinopril-hydrochlorothiazide (ZESTORETIC) 20-12.5 MG tablet Take 1 tablet by mouth daily.  . nitroGLYCERIN (NITROSTAT) 0.4 MG SL tablet Place 1 tablet (0.4 mg total) under the tongue every 5 (five) minutes as needed  for chest pain.  . rivaroxaban (XARELTO) 20 MG TABS tablet Take 20 mg by mouth daily with supper.  . torsemide (DEMADEX) 20 MG tablet Take 20 mg by mouth daily as needed (for swelling).  . vitamin B-12 (CYANOCOBALAMIN) 1000 MCG tablet Take 1,000 mcg by mouth every Monday.     Allergies:   Patient has no known allergies.   Social History   Socioeconomic History  . Marital status: Married    Spouse name: Not on file  . Number of children: Not on file  . Years of education: Not on file  . Highest education level: Not on file  Occupational History  . Not on file  Tobacco Use  . Smoking status: Former Smoker    Years: 1.00    Quit date: 1986    Years since quitting: 36.4  . Smokeless tobacco: Never Used  Substance and Sexual Activity  . Alcohol use: Not on file  . Drug use: Not on file  . Sexual activity: Not on file  Other Topics Concern  . Not on file  Social  History Narrative  . Not on file   Social Determinants of Health   Financial Resource Strain: Not on file  Food Insecurity: Not on file  Transportation Needs: Not on file  Physical Activity: Not on file  Stress: Not on file  Social Connections: Not on file     Family History: The patient's family history includes Alzheimer's disease in his mother; Cancer in his mother; Clotting disorder in his mother; Diabetes in his father and mother; Stroke in his mother. ROS:   Please see the history of present illness.    All 14 point review of systems negative except as described per history of present illness  EKGs/Labs/Other Studies Reviewed:      Recent Labs: 10/30/2020: B Natriuretic Peptide 37.3 11/03/2020: ALT 22; Magnesium 2.0 11/04/2020: BUN 26; Creatinine, Ser 1.89; Hemoglobin 11.8; Platelets 209; Potassium 3.6; Sodium 138  Recent Lipid Panel No results found for: CHOL, TRIG, HDL, CHOLHDL, VLDL, LDLCALC, LDLDIRECT  Physical Exam:    VS:  BP (!) 170/86 (BP Location: Right Arm, Patient Position: Sitting)   Pulse  71   Ht 6\' 5"  (1.956 m)   Wt (!) 382 lb (173.3 kg)   SpO2 94%   BMI 45.30 kg/m     Wt Readings from Last 3 Encounters:  11/07/20 (!) 382 lb (173.3 kg)  11/03/20 (!) 380 lb 1.2 oz (172.4 kg)  09/13/20 (!) 383 lb (173.7 kg)     GEN:  Well nourished, well developed in no acute distress HEENT: Normal NECK: No JVD; No carotid bruits LYMPHATICS: No lymphadenopathy CARDIAC: RRR, no murmurs, no rubs, no gallops RESPIRATORY:  Clear to auscultation without rales, wheezing or rhonchi  ABDOMEN: Soft, non-tender, non-distended MUSCULOSKELETAL:  No edema; No deformity  SKIN: Warm and dry LOWER EXTREMITIES: no swelling NEUROLOGIC:  Alert and oriented x 3 PSYCHIATRIC:  Normal affect   ASSESSMENT:    1. Essential hypertension   2. Chronic diastolic congestive heart failure ([place])   3. Morbid obesity with body mass index (BMI) of 40.0 to 49.9 (HCC)   4. Bacteremia due to group B Streptococcus   5. Atypical chest pain   6. Anticoagulant long-term use    PLAN:    In order of problems listed above:  1. Essential hypertension blood pressure elevated today but he said he check it at home usually 130/70.  We will continue present management. 2. Diastolic congestive heart failure successfully managed with appropriate medication doing well from that point review. 3. Morbid obesity noted he understand he needs to lose weight which will be very beneficial. 4. History of bacteremia with strep group B.  TEE done showed no evidence of endocarditis doing well recovered recent discharge from hospital. 5. Anticoagulation long-term, secondary to history of PE we will continue that management. 6. Dyslipidemia interestingly he does not have listed cholesterol medication on the list of medication have but he tells me that he is for sure taking some medication I asked him to call our office later today to tell us exactly what medication he is talking about.   Medication Adjustments/Labs and Tests  Ordered: Current medicines are reviewed at length with the patient today.  Concerns regarding medicines are outlined above.  No orders of the defined types were placed in this encounter.  Medication changes: No orders of the defined types were placed in this encounter.   Signed, Park Liter, MD, [facility] 11/07/2020 2:46 PM    [place]

## 2020-11-07 NOTE — Patient Instructions (Signed)

## 2020-11-10 ENCOUNTER — Ambulatory Visit (INDEPENDENT_AMBULATORY_CARE_PROVIDER_SITE_OTHER): Payer: PRIVATE HEALTH INSURANCE | Admitting: Family

## 2020-11-10 ENCOUNTER — Other Ambulatory Visit: Payer: Self-pay

## 2020-11-10 ENCOUNTER — Encounter: Payer: Self-pay | Admitting: Family

## 2020-11-10 DIAGNOSIS — I87331 Chronic venous hypertension (idiopathic) with ulcer and inflammation of right lower extremity: Secondary | ICD-10-CM | POA: Diagnosis not present

## 2020-11-10 DIAGNOSIS — L97919 Non-pressure chronic ulcer of unspecified part of right lower leg with unspecified severity: Secondary | ICD-10-CM | POA: Diagnosis not present

## 2020-11-10 DIAGNOSIS — L97511 Non-pressure chronic ulcer of other part of right foot limited to breakdown of skin: Secondary | ICD-10-CM | POA: Diagnosis not present

## 2020-11-10 NOTE — Progress Notes (Signed)
Office Visit Note   Patient: Sean Valentine           Date of Birth: 03/25/1966           MRN: [id number] Visit Date: 11/10/2020              Requested by: Raina Mina., MD [place],  [place] [address] PCP: Raina Mina., MD  Chief Complaint  Patient presents with   Right Leg - Wound Check      HPI: The patient is a 55 year old gentleman who is seen today in hospitalization follow-up he has been having issues with venous ulcerations to his right lower extremity.  This is been ongoing for about a month.  He was placed in a 20 to 30 mm Hg graduated compression in the hospital he has been wearing these 23 hours a day wearing them with direct skin contact of his leg ulcers he does also have a ulcer to the bottom of his foot he has been applying Neosporin and a Band-Aid to this.  He continues with his full weightbearing regular daily activities.  He is currently taking amoxicillin.  Assessment & Plan: Visit Diagnoses:  1. Chronic venous hypertension (idiopathic) with ulcer and inflammation of right lower extremity (HCC)   2. Right foot ulcer, limited to breakdown of skin ([place])     Plan: Pleased with improvement in ulcerations.  Continue with vive compression stocking with direct skin contact.  Discussed using it with direct skin contact for the foot ulcer as well.  Follow-Up Instructions: Return in about 2 weeks (around 11/24/2020).   Ortho Exam  Patient is alert, oriented, no adenopathy, well-dressed, normal affect, normal respiratory effort. On examination of the right lower extremity he does have hemosiderin staining and dry flaking skin from his venous insufficiency.  There are 3 open ulcers to his anterior shin these have epithelialized about a fourth of the wound circumferentially.  There is granulation in the wound bed scant fibrinous tissue.  To the plantar aspect of his foot over the arch she does have a Wagner grade 1 ulcer this is 1 cm in diameter this was  debrided with a 10 blade knife of its nonviable tissue there is no necrotic tissue no surrounding erythema no drainage no sign of infection there is some maceration  Imaging: No results found. No images are attached to the encounter.  Labs: Lab Results  Component Value Date   HGBA1C 10.5 (H) 11/01/2020   REPTSTATUS 11/05/2020 FINAL 10/31/2020   CULT  10/31/2020    NO GROWTH 5 DAYS Performed at [facility], [place] [address] [place], [place] [address]    LABORGA GROUP B STREP(S.AGALACTIAE)ISOLATED 10/30/2020     Lab Results  Component Value Date   ALBUMIN 2.5 (L) 11/03/2020   ALBUMIN 2.4 (L) 11/02/2020   ALBUMIN 2.5 (L) 10/31/2020    Lab Results  Component Value Date   MG 2.0 11/03/2020   MG 1.9 11/02/2020   MG 1.7 10/30/2020   No results found for: VD25OH  No results found for: PREALBUMIN CBC EXTENDED Latest Ref Rng & Units 11/04/2020 11/03/2020 11/02/2020  WBC 4.0 - 10.5 K/uL 9.6 6.6 7.2  RBC 4.22 - 5.81 MIL/uL 3.68(L) 3.62(L) 3.59(L)  HGB 13.0 - 17.0 g/dL 11.8(L) 11.7(L) 11.8(L)  HCT 39.0 - 52.0 % 34.9(L) 34.6(L) 34.0(L)  PLT 150 - 400 K/uL 209 201 187  NEUTROABS 1.7 - 7.7 K/uL 6.5 3.8 4.2  LYMPHSABS 0.7 - 4.0 K/uL 1.9  1.8 1.9     There is no height or weight on file to calculate BMI.  Orders:  No orders of the defined types were placed in this encounter.  No orders of the defined types were placed in this encounter.    Procedures: No procedures performed  Clinical Data: No additional findings.  ROS:  All other systems negative, except as noted in the HPI. Review of Systems  Constitutional: Negative.   Cardiovascular:  Positive for leg swelling.  Skin:  Positive for wound. Negative for color change.   Objective: Vital Signs: There were no vitals taken for this visit.  Specialty Comments:  No specialty comments available.  PMFS History: Patient Active Problem List   Diagnosis Date Noted   Bacteremia due to group B Streptococcus 11/03/2020    Chronic venous hypertension (idiopathic) with ulcer and inflammation of right lower extremity (HCC)    Hypertension    Bilateral leg edema    Sepsis due to cellulitis ([place]) 10/30/2020   Sepsis ([place]) 10/30/2020   Atypical chest pain 09/13/2020   Chest pain at rest 08/16/2020   Deformity of toe 08/01/2020   GERD (gastroesophageal reflux disease) 08/01/2020   Posterior tibial tendinitis of right lower extremity 08/01/2020   Tinea pedis 08/01/2020   History of COVID-19 05/05/2020   Other hemochromatosis 03/21/2020   COVID-19 virus infection 02/09/2020   Diabetic ulcer of toe of right foot associated with type 2 diabetes mellitus, with fat layer exposed ([place]) 01/10/2020   Benign hypertension with CKD (chronic kidney disease) stage III ([place]) 07/12/2019   Type 2 diabetes mellitus with stage 3b chronic kidney disease, with long-term current use of insulin ([place]) 07/12/2019   Pre-ulcerative calluses 06/18/2019   MGUS (monoclonal gammopathy of unknown significance) 06/03/2018   Anemia, chronic disease 05/26/2018   LVH (left ventricular hypertrophy) 45/80/9983   Nonalcoholic steatohepatitis (NASH) 05/26/2018   Spinal stenosis of lumbar region 05/26/2018   Nail dystrophy 05/20/2018   Gouty arthritis of left hand 01/15/2017   Anticoagulant long-term use 11/26/2016   Essential hypertension 11/26/2016   High risk medication use 11/26/2016   History of pulmonary embolism 11/26/2016   Malaise and fatigue 11/26/2016   Mild persistent asthma 11/26/2016   Mixed hyperlipidemia 11/26/2016   Primary central sleep apnea 11/26/2016   Obesity, Class III, BMI 40-49.9 (morbid obesity) ([place]) 11/26/2016   Morbid obesity with body mass index (BMI) of 40.0 to 49.9 ([place]) 11/26/2016   Arthritis of right foot 05/08/2016   Subfibular impingement, right 03/13/2016   Status post amputation of toe of right foot ([place]) 07/26/2015   CHF (congestive heart failure) ([place]) 03/05/2012   Dyslipidemia 03/05/2012    Localized edema 03/05/2012   Morbid (severe) obesity due to excess calories ([place]) 03/05/2012   Nephrolithiasis 03/05/2012   Sleep apnea 03/05/2012   Past Medical History:  Diagnosis Date   Anemia, chronic disease 05/26/2018   Formatting of this note might be different from the original. Eval with [facility] and phlebotomy for Hemochromotosis.   Anticoagulant long-term use 11/26/2016   Arthritis of right foot 05/08/2016   Atypical chest pain 09/13/2020   Benign hypertension with CKD (chronic kidney disease) stage III ([place]) 07/12/2019   Bilateral leg edema    Chest pain at rest 08/16/2020   CHF (congestive heart failure) ([place]) 03/05/2012   COVID-19 virus infection 02/09/2020   Deformity of toe 08/01/2020   Diabetic ulcer of toe of right foot associated with type 2 diabetes mellitus, with fat layer exposed ([place]) 01/10/2020  Formatting of this note might be different from the original. Distal tuft right fifth toe   Dyslipidemia 03/05/2012   Essential hypertension 11/26/2016   GERD (gastroesophageal reflux disease) 08/01/2020   Gouty arthritis of left hand 01/15/2017   High risk medication use 11/26/2016   History of COVID-19 05/05/2020   Formatting of this note might be different from the original. 02/2020   History of pulmonary embolism 11/26/2016   Formatting of this note might be different from the original. Felt severe with permanent anticoagulation and Filter placed because of burden.   Hypertension    Localized edema 03/05/2012   LVH (left ventricular hypertrophy) 05/26/2018   Formatting of this note might be different from the original. 04/2018 EF 555-60%   Malaise and fatigue 11/26/2016   MGUS (monoclonal gammopathy of unknown significance) 06/03/2018   Formatting of this note might be different from the original. Possible. Seen on SPEP 05/2018   Mild persistent asthma 11/26/2016   Mixed hyperlipidemia 11/26/2016   Morbid (severe) obesity due to excess calories ([place]) 03/05/2012   Morbid obesity with body  mass index (BMI) of 40.0 to 49.9 ([place]) 11/26/2016   Nail dystrophy 05/20/2018   Nephrolithiasis 41/08/2438   Nonalcoholic steatohepatitis (NASH) 05/26/2018   Obesity, Class III, BMI 40-49.9 (morbid obesity) ([place]) 11/26/2016   Other hemochromatosis 03/21/2020   Posterior tibial tendinitis of right lower extremity 08/01/2020   Pre-ulcerative calluses 06/18/2019   Primary central sleep apnea 11/26/2016   Formatting of this note might be different from the original. bipap   Sepsis ([place]) 10/30/2020   Sepsis due to cellulitis ([place]) 10/30/2020   Sleep apnea 03/05/2012   Formatting of this note might be different from the original. [place].   Spinal stenosis of lumbar region 05/26/2018   Status post amputation of toe of right foot ([place]) 07/26/2015   Subfibular impingement, right 03/13/2016   Tinea pedis 08/01/2020   Type 2 diabetes mellitus with stage 3b chronic kidney disease, with long-term current use of insulin ([place]) 07/12/2019   Formatting of this note might be different from the original. IMO 10/01 Updates  Formatting of this note might be different from the original. 2000    Family History  Problem Relation Age of Onset   Cancer Mother    Clotting disorder Mother    Stroke Mother    Alzheimer's disease Mother    Diabetes Mother    Diabetes Father     Past Surgical History:  Procedure Laterality Date   BUBBLE STUDY  11/03/2020   Procedure: BUBBLE STUDY;  Surgeon: Geralynn Rile, MD;  Location: [facility];  Service: Cardiovascular;;   CHOLECYSTECTOMY     CYSTOSCOPY W/ URETERAL STENT PLACEMENT Left 05/06/2005   FOOT SURGERY Right    Big toe amputation   IVC FILTER INSERTION  08/2016   KIDNEY STONE SURGERY     SHOULDER SURGERY Left    TEE WITHOUT CARDIOVERSION N/A 11/03/2020   Procedure: TRANSESOPHAGEAL ECHOCARDIOGRAM (TEE);  Surgeon: Geralynn Rile, MD;  Location: [facility] ENDOSCOPY;  Service: Cardiovascular;  Laterality: N/A;   Social History   Occupational History   Not on file   Tobacco Use   Smoking status: Former    Years: 1.00    Pack years: 0.00    Types: Cigarettes    Quit date: 1986    Years since quitting: 36.4   Smokeless tobacco: Never  Substance and Sexual Activity   Alcohol use: Not on file   Drug use: Not on file   Sexual  activity: Not on file

## 2020-11-13 ENCOUNTER — Encounter: Payer: Self-pay | Admitting: Oncology

## 2020-11-15 ENCOUNTER — Ambulatory Visit (INDEPENDENT_AMBULATORY_CARE_PROVIDER_SITE_OTHER): Payer: PRIVATE HEALTH INSURANCE | Admitting: Infectious Diseases

## 2020-11-15 ENCOUNTER — Other Ambulatory Visit: Payer: Self-pay

## 2020-11-15 VITALS — BP 175/90 | HR 84 | Temp 98.1°F | Resp 18 | Ht 77.0 in | Wt 375.8 lb

## 2020-11-15 DIAGNOSIS — N189 Chronic kidney disease, unspecified: Secondary | ICD-10-CM | POA: Diagnosis not present

## 2020-11-15 DIAGNOSIS — B951 Streptococcus, group B, as the cause of diseases classified elsewhere: Secondary | ICD-10-CM

## 2020-11-15 DIAGNOSIS — Z5181 Encounter for therapeutic drug level monitoring: Secondary | ICD-10-CM

## 2020-11-15 DIAGNOSIS — L97919 Non-pressure chronic ulcer of unspecified part of right lower leg with unspecified severity: Secondary | ICD-10-CM

## 2020-11-15 DIAGNOSIS — I87331 Chronic venous hypertension (idiopathic) with ulcer and inflammation of right lower extremity: Secondary | ICD-10-CM | POA: Diagnosis not present

## 2020-11-15 DIAGNOSIS — R7881 Bacteremia: Secondary | ICD-10-CM

## 2020-11-15 HISTORY — DX: Encounter for therapeutic drug level monitoring: Z51.81

## 2020-11-15 NOTE — Progress Notes (Signed)
[facility]                                                             [place], [place], [place], [address]                                                                  Phn. [phone]; Fax: [phone]                                                                             Date: 11/15/20  Reason for Referral: HFU for Streptococcal bacteremia   Assessment Problem List Items Addressed This Visit       Cardiovascular and Mediastinum   Chronic venous hypertension (idiopathic) with ulcer and inflammation of right lower extremity ([place])     Genitourinary   Chronic kidney disease     Other   Bacteremia due to group B Streptococcus   Medication monitoring encounter - Primary   Relevant Orders   CBC   Basic metabolic panel    Group B Strep Bacteremia 2/2 chronic Lower extremity swelling  Venous Insufficiency of Lower Extremities with superficial ulcer in the RT anterior leg Superficial wound in the rt plantar foot - does not appear to be infected at this time  CKD DM Morbid Obesity  Medication monitoring  Plan Complete 14 days of Amoxicillin as prescribed, patient says he has 5-6 more doses. Continue Compression stockings  Elevate legs   Fu with wound care center CBC and BMP today  Fu as needed with [place]  All questions and concerns were discussed and addressed. Patient verbalized understanding of the plan. ____________________________________________________________________________________________________________________ HPI/nterval Events Here for HFU for Group B Streptococcus bacteremia in the setting of chronic Lower extremity swelling. TTE and TEE negative for vegetations. Blood cultures cleared on 5/31. Patient was also seen by Orthopedics Dr Sharol Given for Rt lower leg ulcers and rt plantar foot wound. Ulcers in the rt leg were thought to  be related to venous ulcers in  the setting of venous insufficiency for which compression stockings were given. [place] of rt foot showed  mm fluid collection in the plantar right forefoot at the site of the puncture wound. This is located approximately 10 mm deep to the skin surface.No surgical intervention recommended per Ortho. Patient was discharged on 6/4 to complete 14 days course of Amoxicillin from day of negative blood cultures on 5/31  Patient is doing well. Denies any complaints. He has been taking Amoxicillin as instructed, only missed one dose. He has 5-6 more doses to complete the course. He had loose stools in the beginning of tx that has resolved. Now. Lower extremity swelling has significantly improved with compression stocking. He is also following up with wound care center.  ROS: Constitutional: Negative for fever, chills, activity change, appetite change, fatigue and unexpected weight change.  HENT: Negative for congestion, sore throat, rhinorrhea, sneezing, trouble swallowing and sinus pressure.  Eyes: Negative for photophobia and visual disturbance.  Respiratory: Negative for cough, chest tightness, shortness of breath, wheezing and stridor.  Cardiovascular: Negative for chest pain, palpitations and leg swelling.  Gastrointestinal: Negative for nausea, vomiting, abdominal pain, diarrhea, constipation, blood in stool, abdominal distention and anal bleeding.  Genitourinary: Negative for dysuria, hematuria, flank pain and difficulty urinating.  Musculoskeletal: Negative for myalgias, back pain, joint swelling, arthralgias and gait problem.  Skin: Negative for color change, pallor, rash and wound.  Neurological: Negative for dizziness, tremors, weakness and light-headedness.  Hematological: Negative for adenopathy. Does not bruise/bleed easily.  Psychiatric/Behavioral: Negative for behavioral problems, confusion, sleep disturbance, dysphoric mood, decreased concentration and agitation.   Past Medical History:   Diagnosis Date   Anemia, chronic disease 05/26/2018   Formatting of this note might be different from the original. Eval with [facility] and phlebotomy for Hemochromotosis.   Anticoagulant long-term use 11/26/2016   Arthritis of right foot 05/08/2016   Atypical chest pain 09/13/2020   Benign hypertension with CKD (chronic kidney disease) stage III ([place]) 07/12/2019   Bilateral leg edema    Chest pain at rest 08/16/2020   CHF (congestive heart failure) ([place]) 03/05/2012   COVID-19 virus infection 02/09/2020   Deformity of toe 08/01/2020   Diabetic ulcer of toe of right foot associated with type 2 diabetes mellitus, with fat layer exposed ([place]) 01/10/2020   Formatting of this note might be different from the original. Distal tuft right fifth toe   Dyslipidemia 03/05/2012   Essential hypertension 11/26/2016   GERD (gastroesophageal reflux disease) 08/01/2020   Gouty arthritis of left hand 01/15/2017   High risk medication use 11/26/2016   History of COVID-19 05/05/2020   Formatting of this note might be different from the original. 02/2020   History of pulmonary embolism 11/26/2016   Formatting of this note might be different from the original. Felt severe with permanent anticoagulation and Filter placed because of burden.   Hypertension    Localized edema 03/05/2012   LVH (left ventricular hypertrophy) 05/26/2018   Formatting of this note might be different from the original. 04/2018 EF 555-60%   Malaise and fatigue 11/26/2016   MGUS (monoclonal gammopathy of unknown significance) 06/03/2018   Formatting of this note might be different from the original. Possible. Seen on SPEP 05/2018   Mild persistent asthma 11/26/2016   Mixed hyperlipidemia 11/26/2016   Morbid (severe) obesity due to excess calories ([place]) 03/05/2012   Morbid obesity with body mass index (BMI) of 40.0 to 49.9 ([place]) 11/26/2016   Nail dystrophy 05/20/2018   Nephrolithiasis 51/12/6158   Nonalcoholic steatohepatitis ([facility]) 05/26/2018   Obesity, Class  III, BMI 40-49.9 (morbid obesity) ([place]) 11/26/2016   Other hemochromatosis 03/21/2020   Posterior tibial tendinitis of right lower extremity 08/01/2020   Pre-ulcerative calluses 06/18/2019   Primary central sleep apnea 11/26/2016   Formatting of this note might be different from the original. bipap   Sepsis ([place]) 10/30/2020   Sepsis due to cellulitis ([place]) 10/30/2020   Sleep apnea 03/05/2012   Formatting of this note might be different from the original. [place].   Spinal stenosis of lumbar region 05/26/2018   Status post amputation of toe of right foot ([place]) 07/26/2015   Subfibular impingement, right 03/13/2016   Tinea pedis 08/01/2020   Type 2 diabetes mellitus with stage 3b  chronic kidney disease, with long-term current use of insulin ([place]) 07/12/2019   Formatting of this note might be different from the original. IMO 10/01 Updates  Formatting of this note might be different from the original. 2000   Past Surgical History:  Procedure Laterality Date   BUBBLE STUDY  11/03/2020   Procedure: BUBBLE STUDY;  Surgeon: Geralynn Rile, MD;  Location: [facility];  Service: Cardiovascular;;   CHOLECYSTECTOMY     CYSTOSCOPY W/ URETERAL STENT PLACEMENT Left 05/06/2005   FOOT SURGERY Right    Big toe amputation   IVC FILTER INSERTION  08/2016   KIDNEY STONE SURGERY     SHOULDER SURGERY Left    TEE WITHOUT CARDIOVERSION N/A 11/03/2020   Procedure: TRANSESOPHAGEAL ECHOCARDIOGRAM (TEE);  Surgeon: Geralynn Rile, MD;  Location: [facility] ENDOSCOPY;  Service: Cardiovascular;  Laterality: N/A;   Current Outpatient Medications on File Prior to Visit  Medication Sig Dispense Refill   amoxicillin (AMOXIL) 500 MG tablet Take 1 tablet (500 mg total) by mouth 2 (two) times daily for 11 days. 22 tablet 0   carvedilol (COREG) 25 MG tablet Take 25 mg by mouth 2 (two) times daily with a meal.     empagliflozin (JARDIANCE) 25 MG TABS tablet Take 25 mg by mouth daily.     ergocalciferol (VITAMIN D2) 1.25 MG (50000  UT) capsule Take 1 capsule by mouth every Monday.     fluconazole (DIFLUCAN) 100 MG tablet Take 100 mg by mouth daily.     insulin aspart (FIASP FLEXTOUCH) 100 UNIT/ML FlexTouch Pen Inject 20 Units into the skin 3 (three) times daily. Inject 20 units into the skin before each meal     insulin regular human CONCENTRATED (HUMULIN R) 500 UNIT/ML injection Inject 30 Units into the skin 3 (three) times daily with meals.     isosorbide mononitrate (IMDUR) 30 MG 24 hr tablet Take 1 tablet (30 mg total) by mouth daily. 90 tablet 1   lisinopril-hydrochlorothiazide (ZESTORETIC) 20-12.5 MG tablet Take 1 tablet by mouth daily.     nitroGLYCERIN (NITROSTAT) 0.4 MG SL tablet Place 1 tablet (0.4 mg total) under the tongue every 5 (five) minutes as needed for chest pain. 25 tablet 11   rivaroxaban (XARELTO) 20 MG TABS tablet Take 20 mg by mouth daily with supper.     torsemide (DEMADEX) 20 MG tablet Take 20 mg by mouth daily as needed (for swelling).     vitamin B-12 (CYANOCOBALAMIN) 1000 MCG tablet Take 1,000 mcg by mouth every Monday.     No current facility-administered medications on file prior to visit.   No Known Allergies  Social History   Socioeconomic History   Marital status: Married    Spouse name: Not on file   Number of children: Not on file   Years of education: Not on file   Highest education level: Not on file  Occupational History   Not on file  Tobacco Use   Smoking status: Former    Years: 1.00    Pack years: 0.00    Types: Cigarettes    Quit date: 74    Years since quitting: 36.4   Smokeless tobacco: Never  Substance and Sexual Activity   Alcohol use: Not on file   Drug use: Not on file   Sexual activity: Not on file  Other Topics Concern   Not on file  Social History Narrative   Not on file   Social Determinants of Health   Financial Resource Strain: Not on  file  Food Insecurity: Not on file  Transportation Needs: Not on file  Physical Activity: Not on file   Stress: Not on file  Social Connections: Not on file  Intimate Partner Violence: Not on file     Vitals BP (!) 175/90 (BP Location: Right Arm, Patient Position: Sitting, Cuff Size: Large)   Pulse 84   Temp 98.1 F (36.7 C) (Oral)   Resp 18   Ht 6\' 5"  (1.956 m)   Wt (!) 375 lb 12.8 oz (170.5 kg)   SpO2 96%   BMI 44.56 kg/m    Examination  General - not in acute distress, comfortably sitting in chair, OBESE  HEENT - PEERLA, no pallor and no icterus Chest - b/l clear air entry, no additional sounds CVS- Normal s1s2, RRR Abdomen - Soft, Non tender , non distended Ext-      Neuro: grossly normal Back - WNL Psych : calm and cooperative   Recent labs CBC Latest Ref Rng & Units 11/04/2020 11/03/2020 11/02/2020  WBC 4.0 - 10.5 K/uL 9.6 6.6 7.2  Hemoglobin 13.0 - 17.0 g/dL 11.8(L) 11.7(L) 11.8(L)  Hematocrit 39.0 - 52.0 % 34.9(L) 34.6(L) 34.0(L)  Platelets 150 - 400 K/uL 209 201 187   CMP Latest Ref Rng & Units 11/04/2020 11/03/2020 11/02/2020  Glucose 70 - 99 mg/dL 190(H) 199(H) 200(H)  BUN 6 - 20 mg/dL 26(H) 33(H) 33(H)  Creatinine 0.61 - 1.24 mg/dL 1.89(H) 2.11(H) 2.04(H)  Sodium 135 - 145 mmol/L 138 139 136  Potassium 3.5 - 5.1 mmol/L 3.6 3.8 3.3(L)  Chloride 98 - 111 mmol/L 105 103 99  CO2 22 - 32 mmol/L 24 27 28   Calcium 8.9 - 10.3 mg/dL 8.6(L) 9.0 8.7(L)  Total Protein 6.5 - 8.1 g/dL - 6.1(L) 6.1(L)  Total Bilirubin 0.3 - 1.2 mg/dL - 0.5 0.7  Alkaline Phos 38 - 126 U/L - 42 40  AST 15 - 41 U/L - 17 16  ALT 0 - 44 U/L - 22 18     Pertinent Microbiology Results for orders placed or performed during the hospital encounter of 10/30/20  Blood Culture (routine x 2)     Status: Abnormal   Collection Time: 10/30/20  2:57 AM   Specimen: BLOOD  Result Value Ref Range Status   Specimen Description BLOOD RIGHT ANTECUBITAL  Final   Special Requests   Final    BOTTLES DRAWN AEROBIC AND ANAEROBIC Blood Culture adequate volume   Culture  Setup Time   Final    GRAM POSITIVE  COCCI IN CHAINS IN BOTH AEROBIC AND ANAEROBIC BOTTLES CRITICAL RESULT CALLED TO, READ BACK BY AND VERIFIED WITH: Lafe Garin [id number] FCP Performed at [facility], [address], [place], [place] [address]    Culture GROUP B STREP(S.AGALACTIAE)ISOLATED (A)  Final   Report Status 11/01/2020 FINAL  Final   Organism ID, Bacteria GROUP B STREP(S.AGALACTIAE)ISOLATED  Final      Susceptibility   Group b strep(s.agalactiae)isolated - MIC*    CLINDAMYCIN >=1 RESISTANT Resistant     AMPICILLIN <=0.25 SENSITIVE Sensitive     ERYTHROMYCIN >=8 RESISTANT Resistant     VANCOMYCIN 0.5 SENSITIVE Sensitive     CEFTRIAXONE <=0.12 SENSITIVE Sensitive     LEVOFLOXACIN 1 SENSITIVE Sensitive     * GROUP B STREP(S.AGALACTIAE)ISOLATED  Blood Culture ID Panel (Reflexed)     Status: Abnormal   Collection Time: 10/30/20  2:57 AM  Result Value Ref Range Status   Enterococcus faecalis NOT DETECTED NOT DETECTED  Final   Enterococcus Faecium NOT DETECTED NOT DETECTED Final   Listeria monocytogenes NOT DETECTED NOT DETECTED Final   Staphylococcus species NOT DETECTED NOT DETECTED Final   Staphylococcus aureus (BCID) NOT DETECTED NOT DETECTED Final   Staphylococcus epidermidis NOT DETECTED NOT DETECTED Final   Staphylococcus lugdunensis NOT DETECTED NOT DETECTED Final   Streptococcus species DETECTED (A) NOT DETECTED Final    Comment: CRITICAL RESULT CALLED TO, READ BACK BY AND VERIFIED WITH: PHARMD FRANK W. [phone] FCP    Streptococcus agalactiae DETECTED (A) NOT DETECTED Final    Comment: CRITICAL RESULT CALLED TO, READ BACK BY AND VERIFIED WITH: PHARMD FRANK W. [id number] FCP    Streptococcus pneumoniae NOT DETECTED NOT DETECTED Final   Streptococcus pyogenes NOT DETECTED NOT DETECTED Final   A.calcoaceticus-baumannii NOT DETECTED NOT DETECTED Final   Bacteroides fragilis NOT DETECTED NOT DETECTED Final   Enterobacterales NOT DETECTED NOT DETECTED Final   Enterobacter cloacae complex NOT  DETECTED NOT DETECTED Final   Escherichia coli NOT DETECTED NOT DETECTED Final   Klebsiella aerogenes NOT DETECTED NOT DETECTED Final   Klebsiella oxytoca NOT DETECTED NOT DETECTED Final   Klebsiella pneumoniae NOT DETECTED NOT DETECTED Final   Proteus species NOT DETECTED NOT DETECTED Final   Salmonella species NOT DETECTED NOT DETECTED Final   Serratia marcescens NOT DETECTED NOT DETECTED Final   Haemophilus influenzae NOT DETECTED NOT DETECTED Final   Neisseria meningitidis NOT DETECTED NOT DETECTED Final   Pseudomonas aeruginosa NOT DETECTED NOT DETECTED Final   Stenotrophomonas maltophilia NOT DETECTED NOT DETECTED Final   Candida albicans NOT DETECTED NOT DETECTED Final   Candida auris NOT DETECTED NOT DETECTED Final   Candida glabrata NOT DETECTED NOT DETECTED Final   Candida krusei NOT DETECTED NOT DETECTED Final   Candida parapsilosis NOT DETECTED NOT DETECTED Final   Candida tropicalis NOT DETECTED NOT DETECTED Final   Cryptococcus neoformans/gattii NOT DETECTED NOT DETECTED Final    Comment: Performed at [facility], [address] [place], [place] [address]  Blood Culture (routine x 2)     Status: Abnormal   Collection Time: 10/30/20  3:50 AM   Specimen: BLOOD RIGHT FOREARM  Result Value Ref Range Status   Specimen Description BLOOD RIGHT FOREARM  Final   Special Requests   Final    BOTTLES DRAWN AEROBIC AND ANAEROBIC Blood Culture results may not be optimal due to an inadequate volume of blood received in culture bottles   Culture  Setup Time   Final    GRAM POSITIVE COCCI IN CHAINS IN BOTH AEROBIC AND ANAEROBIC BOTTLES    Culture (A)  Final    GROUP B STREP(S.AGALACTIAE)ISOLATED SUSCEPTIBILITIES PERFORMED ON PREVIOUS CULTURE WITHIN THE LAST 5 DAYS. Performed at [facility], [place] [address], [place], [place] [address]    Report Status 11/01/2020 FINAL  Final  Resp Panel by RT-PCR (Flu A&B, Covid) Nasopharyngeal Swab     Status: None   Collection Time:  10/30/20  3:57 AM   Specimen: Nasopharyngeal Swab; Nasopharyngeal(NP) swabs in vial transport medium  Result Value Ref Range Status   SARS Coronavirus 2 by RT PCR NEGATIVE NEGATIVE Final    Comment: (NOTE) SARS-CoV-2 target nucleic acids are NOT DETECTED.  The SARS-CoV-2 RNA is generally detectable in upper respiratory specimens during the acute phase of infection. The lowest concentration of SARS-CoV-2 viral copies this assay can detect is 138 copies/mL. A negative result does not preclude SARS-Cov-2 infection and should not be used  as the sole basis for treatment or other patient management decisions. A negative result may occur with  improper specimen collection/handling, submission of specimen other than nasopharyngeal swab, presence of viral mutation(s) within the areas targeted by this assay, and inadequate number of viral copies(<138 copies/mL). A negative result must be combined with clinical observations, patient history, and epidemiological information. The expected result is Negative.  Fact Sheet for Patients:  EntrepreneurPulse.com.au  Fact Sheet for Healthcare Providers:  IncredibleEmployment.be  This test is no t yet approved or cleared by the [place] FDA and  has been authorized for detection and/or diagnosis of SARS-CoV-2 by FDA under an Emergency Use Authorization (EUA). This EUA will remain  in effect (meaning this test can be used) for the duration of the COVID-19 declaration under Section 564(b)(1) of the Act, 21 U.S.C.section 360bbb-3(b)(1), unless the authorization is terminated  or revoked sooner.       Influenza A by PCR NEGATIVE NEGATIVE Final   Influenza B by PCR NEGATIVE NEGATIVE Final    Comment: (NOTE) The Xpert Xpress SARS-CoV-2/FLU/RSV plus assay is intended as an aid in the diagnosis of influenza from Nasopharyngeal swab specimens and should not be used as a sole basis for treatment. Nasal washings  and aspirates are unacceptable for Xpert Xpress SARS-CoV-2/FLU/RSV testing.  Fact Sheet for Patients: EntrepreneurPulse.com.au  Fact Sheet for Healthcare Providers: IncredibleEmployment.be  This test is not yet approved or cleared by the [place] FDA and has been authorized for detection and/or diagnosis of SARS-CoV-2 by FDA under an Emergency Use Authorization (EUA). This EUA will remain in effect (meaning this test can be used) for the duration of the COVID-19 declaration under Section 564(b)(1) of the Act, 21 U.S.C. section 360bbb-3(b)(1), unless the authorization is terminated or revoked.  Performed at [facility], [place] [address] [place], [place] [address]   Culture, blood (routine x 2)     Status: None   Collection Time: 10/31/20  8:19 PM   Specimen: BLOOD  Result Value Ref Range Status   Specimen Description BLOOD LEFT ANTECUBITAL  Final   Special Requests   Final    BOTTLES DRAWN AEROBIC AND ANAEROBIC Blood Culture adequate volume   Culture   Final    NO GROWTH 5 DAYS Performed at [facility], [place] [address] [place], [place] [address]    Report Status 11/05/2020 FINAL  Final  Culture, blood (routine x 2)     Status: None   Collection Time: 10/31/20  8:22 PM   Specimen: BLOOD LEFT HAND  Result Value Ref Range Status   Specimen Description BLOOD LEFT HAND  Final   Special Requests   Final    BOTTLES DRAWN AEROBIC AND ANAEROBIC Blood Culture adequate volume   Culture   Final    NO GROWTH 5 DAYS Performed at [facility], [place] [address], [place],  [address]    Report Status 11/05/2020 FINAL  Final    Pertinent Imaging [place] RLE 11/01/20 FINDINGS: Sonographic evaluation of the plantar aspect of the right forefoot was performed at the site of the puncture wound. There is a 6 mm fluid collection deep to the puncture wound, located approximately 10 mm deep to the skin surface, with small tract  extending toward the skin surface. Superficial subcutaneous edema is identified. I do not see any foreign body.   IMPRESSION: 1. 6 mm fluid collection in the plantar right forefoot at the site of the puncture wound. This is located approximately  10 mm deep to the skin surface. 2. No evidence of foreign body.    All pertinent labs/Imagings/notes reviewed. All pertinent plain films and CT images have been personally visualized and interpreted; radiology reports have been reviewed. Decision making incorporated into the Impression / Recommendations.  I have spent 60 minutes for this patient encounter including  review of prior medical records with greater than 50% of time in face to face counsel of the patient/discussing diagnostics and plan of care.   Electronically signed by:  Rosiland Oz, MD Infectious Disease Physician [facility] [address] [place], [place] [address] Phone: [phone]  Fax: [phone]

## 2020-11-16 LAB — BASIC METABOLIC PANEL
BUN/Creatinine Ratio: 24 (calc) — ABNORMAL HIGH (ref 6–22)
BUN: 52 mg/dL — ABNORMAL HIGH (ref 7–25)
CO2: 25 mmol/L (ref 20–32)
Calcium: 9.8 mg/dL (ref 8.6–10.3)
Chloride: 100 mmol/L (ref 98–110)
Creat: 2.15 mg/dL — ABNORMAL HIGH (ref 0.70–1.33)
Glucose, Bld: 298 mg/dL — ABNORMAL HIGH (ref 65–99)
Potassium: 4.3 mmol/L (ref 3.5–5.3)
Sodium: 137 mmol/L (ref 135–146)

## 2020-11-16 LAB — CBC
HCT: 38.7 % (ref 38.5–50.0)
Hemoglobin: 13.1 g/dL — ABNORMAL LOW (ref 13.2–17.1)
MCH: 32.3 pg (ref 27.0–33.0)
MCHC: 33.9 g/dL (ref 32.0–36.0)
MCV: 95.6 fL (ref 80.0–100.0)
MPV: 10.1 fL (ref 7.5–12.5)
Platelets: 284 10*3/uL (ref 140–400)
RBC: 4.05 10*6/uL — ABNORMAL LOW (ref 4.20–5.80)
RDW: 14 % (ref 11.0–15.0)
WBC: 7.2 10*3/uL (ref 3.8–10.8)

## 2020-11-20 ENCOUNTER — Other Ambulatory Visit: Payer: Self-pay

## 2020-11-20 ENCOUNTER — Ambulatory Visit (INDEPENDENT_AMBULATORY_CARE_PROVIDER_SITE_OTHER): Payer: PRIVATE HEALTH INSURANCE | Admitting: Physician Assistant

## 2020-11-20 ENCOUNTER — Encounter: Payer: Self-pay | Admitting: Orthopedic Surgery

## 2020-11-20 DIAGNOSIS — L97919 Non-pressure chronic ulcer of unspecified part of right lower leg with unspecified severity: Secondary | ICD-10-CM

## 2020-11-20 DIAGNOSIS — I87331 Chronic venous hypertension (idiopathic) with ulcer and inflammation of right lower extremity: Secondary | ICD-10-CM | POA: Diagnosis not present

## 2020-11-20 NOTE — Progress Notes (Signed)
Office Visit Note   Patient: Sean Valentine           Date of Birth: 1965-10-06           MRN: [id number] Visit Date: 11/20/2020              Requested by: Raina Mina., MD [place],  [place] [address] PCP: Raina Mina., MD  Chief Complaint  Patient presents with   Right Leg - Follow-up      HPI: Is a pleasant 55 year old gentleman who follows up for his venous stasis disease of his right lower extremity.  He has been wearing a 2838 medical compression sock and feels he is gotten much better.  He is also here in follow-up for a ulcer on the plantar medial surface of his foot.  This was debrided a couple weeks ago.  He was told to just use the sock against the wound rather than placing a Band-Aid between the wound and the sock and he has been doing this.  He just finished his antibiotics overall he feels he is doing better  Assessment & Plan: Visit Diagnoses: No diagnosis found.  Plan: We will follow-up in 2 weeks.  Continue to use compression sock.  I did provide him a felt relieving doughnut to try putting in his shoe and I placed this in the area where the ulcer is.  Follow-Up Instructions: No follow-ups on file.   Ortho Exam  Patient is alert, oriented, no adenopathy, well-dressed, normal affect, normal respiratory effort. Right lower extremity he has some brawny skin changes which she said has had for many many years.  Overall minimal drainage from one place laterally 1 placement medially no open ulcers.  On the plantar surface of the foot he does have a 2 cm round ulcer has some callusing around the edges.  Healthy base.  Base was debrided and the callus was debrided.  There is no surrounding erythema no cellulitis no foul odor  Imaging: No results found. No images are attached to the encounter.  Labs: Lab Results  Component Value Date   HGBA1C 10.5 (H) 11/01/2020   REPTSTATUS 11/05/2020 FINAL 10/31/2020   CULT  10/31/2020    NO GROWTH 5  DAYS Performed at [facility], [place] [address], [place], [place] [address]    LABORGA GROUP B STREP(S.AGALACTIAE)ISOLATED 10/30/2020     Lab Results  Component Value Date   ALBUMIN 2.5 (L) 11/03/2020   ALBUMIN 2.4 (L) 11/02/2020   ALBUMIN 2.5 (L) 10/31/2020    Lab Results  Component Value Date   MG 2.0 11/03/2020   MG 1.9 11/02/2020   MG 1.7 10/30/2020   No results found for: VD25OH  No results found for: PREALBUMIN CBC EXTENDED Latest Ref Rng & Units 11/15/2020 11/04/2020 11/03/2020  WBC 3.8 - 10.8 Thousand/uL 7.2 9.6 6.6  RBC 4.20 - 5.80 Million/uL 4.05(L) 3.68(L) 3.62(L)  HGB 13.2 - 17.1 g/dL 13.1(L) 11.8(L) 11.7(L)  HCT 38.5 - 50.0 % 38.7 34.9(L) 34.6(L)  PLT 140 - 400 Thousand/uL 284 209 201  NEUTROABS 1.7 - 7.7 K/uL - 6.5 3.8  LYMPHSABS 0.7 - 4.0 K/uL - 1.9 1.8     There is no height or weight on file to calculate BMI.  Orders:  No orders of the defined types were placed in this encounter.  No orders of the defined types were placed in this encounter.    Procedures: No procedures performed  Clinical Data: No additional findings.  ROS:  All other systems negative, except as noted in the HPI. Review of Systems  Objective: Vital Signs: There were no vitals taken for this visit.  Specialty Comments:  No specialty comments available.  PMFS History: Patient Active Problem List   Diagnosis Date Noted   Medication monitoring encounter 11/15/2020   Chronic kidney disease 11/15/2020   Bacteremia due to group B Streptococcus 11/03/2020   Chronic venous hypertension (idiopathic) with ulcer and inflammation of right lower extremity (HCC)    Hypertension    Bilateral leg edema    Sepsis due to cellulitis ([place]) 10/30/2020   Sepsis ([place]) 10/30/2020   Atypical chest pain 09/13/2020   Chest pain at rest 08/16/2020   Deformity of toe 08/01/2020   GERD (gastroesophageal reflux disease) 08/01/2020   Posterior tibial tendinitis of right lower extremity  08/01/2020   Tinea pedis 08/01/2020   History of COVID-19 05/05/2020   Other hemochromatosis 03/21/2020   COVID-19 virus infection 02/09/2020   Diabetic ulcer of toe of right foot associated with type 2 diabetes mellitus, with fat layer exposed ([place]) 01/10/2020   Benign hypertension with CKD (chronic kidney disease) stage III ([place]) 07/12/2019   Type 2 diabetes mellitus with stage 3b chronic kidney disease, with long-term current use of insulin ([place]) 07/12/2019   Pre-ulcerative calluses 06/18/2019   MGUS (monoclonal gammopathy of unknown significance) 06/03/2018   Anemia, chronic disease 05/26/2018   LVH (left ventricular hypertrophy) 63/89/3734   Nonalcoholic steatohepatitis (NASH) 05/26/2018   Spinal stenosis of lumbar region 05/26/2018   Nail dystrophy 05/20/2018   Gouty arthritis of left hand 01/15/2017   Anticoagulant long-term use 11/26/2016   Essential hypertension 11/26/2016   High risk medication use 11/26/2016   History of pulmonary embolism 11/26/2016   Malaise and fatigue 11/26/2016   Mild persistent asthma 11/26/2016   Mixed hyperlipidemia 11/26/2016   Primary central sleep apnea 11/26/2016   Obesity, Class III, BMI 40-49.9 (morbid obesity) ([place]) 11/26/2016   Morbid obesity with body mass index (BMI) of 40.0 to 49.9 ([place]) 11/26/2016   Arthritis of right foot 05/08/2016   Subfibular impingement, right 03/13/2016   Status post amputation of toe of right foot ([place]) 07/26/2015   CHF (congestive heart failure) ([place]) 03/05/2012   Dyslipidemia 03/05/2012   Localized edema 03/05/2012   Morbid (severe) obesity due to excess calories ([place]) 03/05/2012   Nephrolithiasis 03/05/2012   Sleep apnea 03/05/2012   Past Medical History:  Diagnosis Date   Anemia, chronic disease 05/26/2018   Formatting of this note might be different from the original. Eval with [facility] and phlebotomy for Hemochromotosis.   Anticoagulant long-term use 11/26/2016   Arthritis of right foot 05/08/2016    Atypical chest pain 09/13/2020   Benign hypertension with CKD (chronic kidney disease) stage III ([place]) 07/12/2019   Bilateral leg edema    Chest pain at rest 08/16/2020   CHF (congestive heart failure) ([place]) 03/05/2012   COVID-19 virus infection 02/09/2020   Deformity of toe 08/01/2020   Diabetic ulcer of toe of right foot associated with type 2 diabetes mellitus, with fat layer exposed ([place]) 01/10/2020   Formatting of this note might be different from the original. Distal tuft right fifth toe   Dyslipidemia 03/05/2012   Essential hypertension 11/26/2016   GERD (gastroesophageal reflux disease) 08/01/2020   Gouty arthritis of left hand 01/15/2017   High risk medication use 11/26/2016   History of COVID-19 05/05/2020   Formatting of this note might be different from the original. 02/2020  History of pulmonary embolism 11/26/2016   Formatting of this note might be different from the original. Felt severe with permanent anticoagulation and Filter placed because of burden.   Hypertension    Localized edema 03/05/2012   LVH (left ventricular hypertrophy) 05/26/2018   Formatting of this note might be different from the original. 04/2018 EF 555-60%   Malaise and fatigue 11/26/2016   MGUS (monoclonal gammopathy of unknown significance) 06/03/2018   Formatting of this note might be different from the original. Possible. Seen on SPEP 05/2018   Mild persistent asthma 11/26/2016   Mixed hyperlipidemia 11/26/2016   Morbid (severe) obesity due to excess calories ([place]) 03/05/2012   Morbid obesity with body mass index (BMI) of 40.0 to 49.9 ([place]) 11/26/2016   Nail dystrophy 05/20/2018   Nephrolithiasis 03/04/7252   Nonalcoholic steatohepatitis (NASH) 05/26/2018   Obesity, Class III, BMI 40-49.9 (morbid obesity) ([place]) 11/26/2016   Other hemochromatosis 03/21/2020   Posterior tibial tendinitis of right lower extremity 08/01/2020   Pre-ulcerative calluses 06/18/2019   Primary central sleep apnea 11/26/2016   Formatting of this note  might be different from the original. bipap   Sepsis ([place]) 10/30/2020   Sepsis due to cellulitis ([place]) 10/30/2020   Sleep apnea 03/05/2012   Formatting of this note might be different from the original. [place].   Spinal stenosis of lumbar region 05/26/2018   Status post amputation of toe of right foot ([place]) 07/26/2015   Subfibular impingement, right 03/13/2016   Tinea pedis 08/01/2020   Type 2 diabetes mellitus with stage 3b chronic kidney disease, with long-term current use of insulin ([place]) 07/12/2019   Formatting of this note might be different from the original. IMO 10/01 Updates  Formatting of this note might be different from the original. 2000    Family History  Problem Relation Age of Onset   Cancer Mother    Clotting disorder Mother    Stroke Mother    Alzheimer's disease Mother    Diabetes Mother    Diabetes Father     Past Surgical History:  Procedure Laterality Date   BUBBLE STUDY  11/03/2020   Procedure: BUBBLE STUDY;  Surgeon: Geralynn Rile, MD;  Location: [facility];  Service: Cardiovascular;;   CHOLECYSTECTOMY     CYSTOSCOPY W/ URETERAL STENT PLACEMENT Left 05/06/2005   FOOT SURGERY Right    Big toe amputation   IVC FILTER INSERTION  08/2016   KIDNEY STONE SURGERY     SHOULDER SURGERY Left    TEE WITHOUT CARDIOVERSION N/A 11/03/2020   Procedure: TRANSESOPHAGEAL ECHOCARDIOGRAM (TEE);  Surgeon: Geralynn Rile, MD;  Location: [facility] ENDOSCOPY;  Service: Cardiovascular;  Laterality: N/A;   Social History   Occupational History   Not on file  Tobacco Use   Smoking status: Former    Years: 1.00    Pack years: 0.00    Types: Cigarettes    Quit date: 1986    Years since quitting: 36.4   Smokeless tobacco: Never  Substance and Sexual Activity   Alcohol use: Not on file   Drug use: Not on file   Sexual activity: Not on file

## 2020-11-26 DIAGNOSIS — L0390 Cellulitis, unspecified: Secondary | ICD-10-CM | POA: Insufficient documentation

## 2020-11-26 DIAGNOSIS — E1142 Type 2 diabetes mellitus with diabetic polyneuropathy: Secondary | ICD-10-CM | POA: Insufficient documentation

## 2020-11-26 HISTORY — DX: Cellulitis, unspecified: L03.90

## 2020-11-28 ENCOUNTER — Telehealth: Payer: Self-pay | Admitting: Oncology

## 2020-11-28 NOTE — Telephone Encounter (Signed)
11/28/20 Patient's wife cancelled all appts.He is currently in the hospital.

## 2020-11-30 ENCOUNTER — Inpatient Hospital Stay: Payer: PRIVATE HEALTH INSURANCE

## 2020-12-01 ENCOUNTER — Telehealth: Payer: Self-pay

## 2020-12-01 ENCOUNTER — Ambulatory Visit: Payer: PRIVATE HEALTH INSURANCE | Admitting: Oncology

## 2020-12-01 NOTE — Telephone Encounter (Signed)
Received call from Dr. Norwood Levo at [facility]. Patient was recently readmitted for group B strep bacteremia and will be discharged on IV ceftriaxone. Previously saw Dr. [place] Bali and will require additional ID follow-up. Scheduled with Dr. Linus Salmons 12/12/20. Patient is also scheduled to have TEE 12/07/20.    Beryle Flock, RN

## 2020-12-05 ENCOUNTER — Ambulatory Visit: Payer: PRIVATE HEALTH INSURANCE | Admitting: Orthopedic Surgery

## 2020-12-05 ENCOUNTER — Encounter: Payer: Self-pay | Admitting: Oncology

## 2020-12-05 DIAGNOSIS — L97512 Non-pressure chronic ulcer of other part of right foot with fat layer exposed: Secondary | ICD-10-CM

## 2020-12-05 HISTORY — DX: Non-pressure chronic ulcer of other part of right foot with fat layer exposed: L97.512

## 2020-12-12 ENCOUNTER — Ambulatory Visit (INDEPENDENT_AMBULATORY_CARE_PROVIDER_SITE_OTHER): Payer: PRIVATE HEALTH INSURANCE | Admitting: Internal Medicine

## 2020-12-12 ENCOUNTER — Other Ambulatory Visit: Payer: Self-pay

## 2020-12-12 ENCOUNTER — Encounter: Payer: Self-pay | Admitting: Internal Medicine

## 2020-12-12 ENCOUNTER — Telehealth: Payer: Self-pay

## 2020-12-12 VITALS — BP 189/82 | HR 83 | Temp 99.0°F | Wt 392.0 lb

## 2020-12-12 DIAGNOSIS — Z5181 Encounter for therapeutic drug level monitoring: Secondary | ICD-10-CM

## 2020-12-12 DIAGNOSIS — B951 Streptococcus, group B, as the cause of diseases classified elsewhere: Secondary | ICD-10-CM

## 2020-12-12 DIAGNOSIS — Z452 Encounter for adjustment and management of vascular access device: Secondary | ICD-10-CM

## 2020-12-12 DIAGNOSIS — R7881 Bacteremia: Secondary | ICD-10-CM

## 2020-12-12 HISTORY — DX: Encounter for adjustment and management of vascular access device: Z45.2

## 2020-12-12 NOTE — Telephone Encounter (Signed)
I spoke with Sean Valentine with [facility] and gave verbal orders for patient to end IV antibiotics on 12/16/20 and picc can be removed after last dose per Dr. Linus Salmons. Signa Cheek T Brooks Sailors

## 2020-12-12 NOTE — Progress Notes (Signed)
   Subjective:    Patient ID: Sean Valentine, male    DOB: [birth year], 55 y.o.   MRN: [id number]  HPI Here for follow up of bacteremia He was hospitalized here at [facility] in May 2022 with cellulitis and group B Strep bacteremia.  Work up with TTE and TEE unrevealing and he completed 14 days of oral amoxicillin.  He returned to an outside hospital with fever and chills and again with GBS bacteremia.  He was started on ceftriaxone and TTE and an outpatient TEE was negative for a vegetation.  He was discharged with 14 days total of ceftriaxone via picc line , planned through July 16th, 2022.  He is fdoing well, no fever, no chills.  Right leg clearing and wounds healing per his report.  He follows podiatry for this.     Review of Systems  Constitutional:  Negative for chills, fatigue, fever and unexpected weight change.  Gastrointestinal:  Negative for diarrhea and nausea.  Skin:  Positive for wound. Negative for rash.      Objective:   Physical Exam Constitutional:      Appearance: Normal appearance.  Eyes:     General: No scleral icterus. Cardiovascular:     Pulses: Normal pulses.  Neurological:     Mental Status: He is alert.  Psychiatric:        Mood and Affect: Mood normal.    SH: no tobacco use      Assessment & Plan:

## 2020-12-12 NOTE — Assessment & Plan Note (Signed)
Recurrent now and completing a prolonged course of IV ceftriaxone through 12/16/20.  No concerns so will stop after that date.   He can return as needed.

## 2020-12-12 NOTE — Assessment & Plan Note (Signed)
picc line in his right arm, working well, no concerns.   Will have home health remove this after the last dose.

## 2020-12-12 NOTE — Assessment & Plan Note (Signed)
Labs reviewed and no concerns with cbc or cmp.  No changes indicated.

## 2020-12-22 DIAGNOSIS — L03115 Cellulitis of right lower limb: Secondary | ICD-10-CM | POA: Insufficient documentation

## 2021-02-06 NOTE — Progress Notes (Signed)
[facility]  [address] [place],  [place]  [address] [phone]  Clinic Day:  02/13/2021  Referring physician: Raina Mina., MD  This document serves as a record of services personally performed by Dequincy Macarthur Critchley, MD. It was created on their behalf by [facility] E, a trained medical scribe. The creation of this record is based on the scribe's personal observations and the provider's statements to them.  HISTORY OF PRESENT ILLNESS:  The patient is a 56 y.o. 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  Due to his elevated iron parameters, the patient undergoes periodic phlebotomies to keep his ferritin less than 100.  He comes in today to reassess his iron parameters.  Since his last visit, the patient has been doing well.  He denies having any systemic symptoms or complications related to his underlying hemochromatosis.  PHYSICAL EXAM:  Blood pressure (!) 176/95, pulse 77, temperature 98.5 F (36.9 C), resp. rate 16, height 6\' 5"  (1.956 m), weight (!) 383 lb 9.6 oz (174 kg), SpO2 96 %. Wt Readings from Last 3 Encounters:  02/15/21 (!) 380 lb 1.9 oz (172.4 kg)  02/13/21 (!) 383 lb 9.6 oz (174 kg)  12/12/20 (!) 392 lb (177.8 kg)   Body mass index is 45.49 kg/m. Performance status (ECOG): 1 - Symptomatic but completely ambulatory Physical Exam Constitutional:      Appearance: Normal appearance. He is not ill-appearing.  HENT:     Mouth/Throat:     Mouth: Mucous membranes are moist.     Pharynx: Oropharynx is clear. No oropharyngeal exudate or posterior oropharyngeal erythema.  Cardiovascular:     Rate and Rhythm: Normal rate and regular rhythm.     Heart sounds: No murmur heard.   No friction rub. No gallop.  Pulmonary:     Effort: Pulmonary effort is normal. No respiratory distress.     Breath sounds: Normal breath sounds. No wheezing, rhonchi or rales.  Abdominal:     General: Bowel sounds are normal. There is no distension.      Palpations: Abdomen is soft. There is no mass.     Tenderness: There is no abdominal tenderness.  Musculoskeletal:        General: No swelling.     Right lower leg: No edema.     Left lower leg: No edema.  Lymphadenopathy:     Cervical: No cervical adenopathy.     Upper Body:     Right upper body: No supraclavicular or axillary adenopathy.     Left upper body: No supraclavicular or axillary adenopathy.     Lower Body: No right inguinal adenopathy. No left inguinal adenopathy.  Skin:    General: Skin is warm.     Coloration: Skin is not jaundiced.     Findings: No lesion or rash.  Neurological:     General: No focal deficit present.     Mental Status: He is alert and oriented to person, place, and time. Mental status is at baseline.     Cranial Nerves: Cranial nerves are intact.  Psychiatric:        Mood and Affect: Mood normal.        Behavior: Behavior normal.        Thought Content: Thought content normal.    LABS:    Ref. Range 02/13/2021 15:57  Iron Latest Ref Range: 45 - 182 ug/dL 107  UIBC Latest Units: ug/dL 192  TIBC Latest Ref Range: 250 - 450 ug/dL 299  Saturation Ratios Latest  Ref Range: 17.9 - 39.5 % 36  Ferritin Latest Ref Range: 24 - 336 ng/mL 341 (H)    Ref. Range 02/13/2021 00:00  Sodium Latest Ref Range: 137 - 147  140  Potassium Latest Ref Range: 3.4 - 5.3  3.5  Chloride Latest Ref Range: 99 - 108  105  CO2 Latest Ref Range: 13 - 22  23 (A)  Glucose Unknown 144  BUN Latest Ref Range: 4 - 21  68 (A)  Creatinine Latest Ref Range: 0.6 - 1.3  2.2 (A)  Calcium Latest Ref Range: 8.7 - 10.7  9.0  Alkaline Phosphatase Latest Ref Range: 25 - 125  48  Albumin Latest Ref Range: 3.5 - 5.0  3.6  AST Latest Ref Range: 14 - 40  20  ALT Latest Ref Range: 10 - 40  14  Bilirubin, Total Unknown 0.4  WBC Unknown 10.1  RBC Latest Ref Range: 3.87 - 5.11  4.24  Hemoglobin Latest Ref Range: 13.5 - 17.5  13.4 (A)  HCT Latest Ref Range: 41 - 53  39 (A)  MCV Latest Ref  Range: 80 - 94  92  Platelets Latest Ref Range: 150 - 399  247    ASSESSMENT & PLAN:  Assessment/Plan:  A 55 y.o. 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  Although his ferritin is much greater than 100, I will arrange for him to be phlebotomized this month and in 3 months.  I will see him back in 6 months to reassess his iron parameters. The patient understands all the plans discussed today and is in agreement with them.     I, Rita Ohara, am acting as scribe for Marice Potter, MD    I have reviewed this report as typed by the medical scribe, and it is complete and accurate.  Dequincy Macarthur Critchley, MD

## 2021-02-13 ENCOUNTER — Inpatient Hospital Stay: Payer: PRIVATE HEALTH INSURANCE | Attending: Oncology | Admitting: Oncology

## 2021-02-13 ENCOUNTER — Inpatient Hospital Stay: Payer: PRIVATE HEALTH INSURANCE

## 2021-02-13 ENCOUNTER — Other Ambulatory Visit: Payer: Self-pay | Admitting: Hematology and Oncology

## 2021-02-13 ENCOUNTER — Telehealth: Payer: Self-pay | Admitting: Oncology

## 2021-02-13 LAB — CBC AND DIFFERENTIAL
HCT: 39 — AB (ref 41–53)
Hemoglobin: 13.4 — AB (ref 13.5–17.5)
Neutrophils Absolute: 6.06
Platelets: 247 (ref 150–399)
WBC: 10.1

## 2021-02-13 LAB — BASIC METABOLIC PANEL
BUN: 68 — AB (ref 4–21)
CO2: 23 — AB (ref 13–22)
Chloride: 105 (ref 99–108)
Creatinine: 2.2 — AB (ref 0.6–1.3)
Glucose: 144
Potassium: 3.5 (ref 3.4–5.3)
Sodium: 140 (ref 137–147)

## 2021-02-13 LAB — HEPATIC FUNCTION PANEL
ALT: 14 (ref 10–40)
AST: 20 (ref 14–40)
Alkaline Phosphatase: 48 (ref 25–125)
Bilirubin, Total: 0.4

## 2021-02-13 LAB — CBC
MCV: 92 (ref 80–94)
RBC: 4.24 (ref 3.87–5.11)

## 2021-02-13 LAB — IRON AND TIBC
Iron: 107 ug/dL (ref 45–182)
Saturation Ratios: 36 % (ref 17.9–39.5)
TIBC: 299 ug/dL (ref 250–450)
UIBC: 192 ug/dL

## 2021-02-13 LAB — COMPREHENSIVE METABOLIC PANEL
Albumin: 3.6 (ref 3.5–5.0)
Calcium: 9 (ref 8.7–10.7)

## 2021-02-13 LAB — FERRITIN: Ferritin: 341 ng/mL — ABNORMAL HIGH (ref 24–336)

## 2021-02-13 NOTE — Telephone Encounter (Signed)
Per 9/13 LOS, patient scheduled for March 2023 Appt's.  Gave patient Appt Summary

## 2021-02-14 ENCOUNTER — Encounter: Payer: Self-pay | Admitting: Oncology

## 2021-02-14 ENCOUNTER — Telehealth: Payer: Self-pay

## 2021-02-14 NOTE — Telephone Encounter (Signed)
9/14 2 1527- I attempted call to pt, to notify him of below. He should receive a call from scheduling to make appt. No answer.    Dr Bobby Rumpf reviewed pt's labs. He would like pt to have phlebotomy this week, again in 3 months, & then be seen in office 6 months.

## 2021-02-15 ENCOUNTER — Inpatient Hospital Stay: Payer: PRIVATE HEALTH INSURANCE

## 2021-02-15 ENCOUNTER — Other Ambulatory Visit: Payer: Self-pay

## 2021-02-15 NOTE — Patient Instructions (Signed)
Therapeutic Phlebotomy Therapeutic phlebotomy is the planned removal of blood from a person's body for the purpose of treating a medical condition. The procedure is similar to donating blood. Usually, about a pint (470 mL, or 0.47 L) of blood is removed. The average adult has 9-12 pints (4.3-5.7 L) of blood in the body. Therapeutic phlebotomy may be used to treat the following medical conditions: Hemochromatosis. This is a condition in which the blood contains too much iron. Polycythemia vera. This is a condition in which the blood contains too many red blood cells. Porphyria cutanea tarda. This is a disease in which an important part of hemoglobin is not made properly. It results in the buildup of abnormal amounts of porphyrins in the body. Sickle cell disease. This is a condition in which the red blood cells form an abnormal crescent shape rather than a round shape. Tell a health care provider about: Any allergies you have. All medicines you are taking, including vitamins, herbs, eye drops, creams, and over-the-counter medicines. Any problems you or family members have had with anesthetic medicines. Any blood disorders you have. Any surgeries you have had. Any medical conditions you have. Whether you are pregnant or may be pregnant. What are the risks? Generally, this is a safe procedure. However, problems may occur, including: Nausea or light-headedness. Low blood pressure (hypotension). Soreness, bleeding, swelling, or bruising at the needle insertion site. Infection. What happens before the procedure? Follow instructions from your health care provider about eating or drinking restrictions. Ask your health care provider about: Changing or stopping your regular medicines. This is especially important if you are taking diabetes medicines or blood thinners (anticoagulants). Taking medicines such as aspirin and ibuprofen. These medicines can thin your blood. Do not take these medicines  unless your health care provider tells you to take them. Taking over-the-counter medicines, vitamins, herbs, and supplements. Wear clothing with sleeves that can be raised above the elbow. Plan to have someone take you home from the hospital or clinic. You may have a blood sample taken. Your blood pressure, pulse rate, and breathing rate will be measured. What happens during the procedure?  To lower your risk of infection: Your health care team will wash or sanitize their hands. Your skin will be cleaned with an antiseptic. You may be given a medicine to numb the area (local anesthetic). A tourniquet will be placed on your arm. A needle will be inserted into one of your veins. Tubing and a collection bag will be attached to that needle. Blood will flow through the needle and tubing into the collection bag. The collection bag will be placed lower than your arm to allow gravity to help the flow of blood into the bag. You may be asked to open and close your hand slowly and continually during the entire collection. After the specified amount of blood has been removed from your body, the collection bag and tubing will be clamped. The needle will be removed from your vein. Pressure will be held on the site of the needle insertion to stop the bleeding. A bandage (dressing) will be placed over the needle insertion site. The procedure may vary among health care providers and hospitals. What happens after the procedure? Your blood pressure, pulse rate, and breathing rate will be measured after the procedure. You will be encouraged to drink fluids. Your recovery will be assessed and monitored. You can return to your normal activities as told by your health care provider. Summary Therapeutic phlebotomy is the planned removal   of blood from a person's body for the purpose of treating a medical condition. Therapeutic phlebotomy may be used to treat hemochromatosis, polycythemia vera, porphyria cutanea  tarda, or sickle cell disease. In the procedure, a needle is inserted and about a pint (470 mL, or 0.47 L) of blood is removed. The average adult has 9-12 pints (4.3-5.7 L) of blood in the body. This is generally a safe procedure, but it can sometimes cause problems such as nausea, light-headedness, or low blood pressure (hypotension). This information is not intended to replace advice given to you by your health care provider. Make sure you discuss any questions you have with your health care provider. Document Revised: 08/29/2020 Document Reviewed: 06/05/2017 Elsevier Patient Education  2022 Elsevier Inc.  

## 2021-02-15 NOTE — Progress Notes (Signed)
[name] presents today for phlebotomy per MD orders. Phlebotomy procedure started at 1525and ended at 1535. 502  grams removed. Patient observed after procedure without any incident. Patient tolerated procedure well. IV needle removed intact.

## 2021-02-21 ENCOUNTER — Encounter: Payer: Self-pay | Admitting: Oncology

## 2021-03-12 ENCOUNTER — Other Ambulatory Visit: Payer: Self-pay | Admitting: Cardiology

## 2021-04-04 DIAGNOSIS — Z8631 Personal history of diabetic foot ulcer: Secondary | ICD-10-CM | POA: Insufficient documentation

## 2021-04-04 HISTORY — DX: Personal history of diabetic foot ulcer: Z86.31

## 2021-05-11 ENCOUNTER — Encounter: Payer: Self-pay | Admitting: Oncology

## 2021-05-16 ENCOUNTER — Telehealth: Payer: Self-pay | Admitting: Oncology

## 2021-05-16 ENCOUNTER — Other Ambulatory Visit: Payer: Self-pay | Admitting: Pharmacist

## 2021-05-16 NOTE — Telephone Encounter (Signed)
Patient rescheduled 12/15 Phlebotomy Therapy to 12/16

## 2021-05-17 ENCOUNTER — Inpatient Hospital Stay: Payer: PRIVATE HEALTH INSURANCE

## 2021-05-18 ENCOUNTER — Inpatient Hospital Stay: Payer: Managed Care, Other (non HMO) | Attending: Oncology

## 2021-05-18 ENCOUNTER — Other Ambulatory Visit: Payer: Self-pay

## 2021-05-18 NOTE — Progress Notes (Signed)
[name] presents today for phlebotomy per MD orders. Phlebotomy procedure started at 1510 and ended at 1530. 502 grams removed. Patient observed after procedure without any incident. Patient tolerated procedure well. IV needle removed intact. Left AC

## 2021-05-18 NOTE — Patient Instructions (Signed)

## 2021-06-13 ENCOUNTER — Ambulatory Visit: Payer: Managed Care, Other (non HMO) | Admitting: Cardiology

## 2021-07-16 DIAGNOSIS — I120 Hypertensive chronic kidney disease with stage 5 chronic kidney disease or end stage renal disease: Secondary | ICD-10-CM | POA: Insufficient documentation

## 2021-08-07 NOTE — Progress Notes (Incomplete)
?[facility]  ?[address] ?[place],  [place]  [address] ?[phone] ? ?I connected with Sean Valentine on 08/14/2021 at 10:45 AM EDT by telephone visit and verified that [name] using two identifiers.  ? ?I discussed the limitations, risks, security and privacy concerns of performing an evaluation and management service by telemedicine and the availability of in-person appointments. I also discussed with the patient that there may be a patient responsible charge related to this service. The patient expressed understanding and agreed to proceed.  ? ?Other persons participating in the visit and their role in the encounter:  *** ?Patient's location:  Home ?Provider's location:  Office ? ?Clinic Day:  08/14/2020 ? ?Referring physician: Raina Mina., MD ? ?This document serves as a record of services personally performed by Sean Potter, MD. It was created on their behalf by Curry,Lauren E, a trained medical scribe. The creation of this record is based on the scribe's personal observations and the provider's statements to them. ? ?HISTORY OF PRESENT ILLNESS:  ?The patient is a 56 y.o. 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  Due to his elevated iron parameters, the patient undergoes periodic phlebotomies to keep his ferritin less than 100.  He comes in today to reassess his iron parameters.  Since his last visit, the patient has been doing well.  He denies having any systemic symptoms or complications related to his underlying hemochromatosis. ? ?PHYSICAL EXAM:  ?There were no vitals taken for this visit. ?Wt Readings from Last 3 Encounters:  ?02/15/21 (!) 380 lb 1.9 oz (172.4 kg)  ?02/13/21 (!) 383 lb 9.6 oz (174 kg)  ?12/12/20 (!) 392 lb (177.8 kg)  ? ?There is no height or weight on file to calculate BMI. ?Performance status (ECOG): 1 - Symptomatic but completely ambulatory ?Physical Exam ?Constitutional:   ?   Appearance: Normal appearance. He  is not ill-appearing.  ?HENT:  ?   Mouth/Throat:  ?   Mouth: Mucous membranes are moist.  ?   Pharynx: Oropharynx is clear. No oropharyngeal exudate or posterior oropharyngeal erythema.  ?Cardiovascular:  ?   Rate and Rhythm: Normal rate and regular rhythm.  ?   Heart sounds: No murmur heard. ?  No friction rub. No gallop.  ?Pulmonary:  ?   Effort: Pulmonary effort is normal. No respiratory distress.  ?   Breath sounds: Normal breath sounds. No wheezing, rhonchi or rales.  ?Abdominal:  ?   General: Bowel sounds are normal. There is no distension.  ?   Palpations: Abdomen is soft. There is no mass.  ?   Tenderness: There is no abdominal tenderness.  ?Musculoskeletal:     ?   General: No swelling.  ?   Right lower leg: No edema.  ?   Left lower leg: No edema.  ?Lymphadenopathy:  ?   Cervical: No cervical adenopathy.  ?   Upper Body:  ?   Right upper body: No supraclavicular or axillary adenopathy.  ?   Left upper body: No supraclavicular or axillary adenopathy.  ?   Lower Body: No right inguinal adenopathy. No left inguinal adenopathy.  ?Skin: ?   General: Skin is warm.  ?   Coloration: Skin is not jaundiced.  ?   Findings: No lesion or rash.  ?Neurological:  ?   General: No focal deficit present.  ?   Mental Status: He is alert and oriented to person, place, and time. Mental status is at baseline.  ?  Psychiatric:     ?   Mood and Affect: Mood normal.     ?   Behavior: Behavior normal.     ?   Thought Content: Thought content normal.  ? ? ?LABS:  ? ? Ref. Range 02/13/2021 15:57  ?Iron Latest Ref Range: 45 - 182 ug/dL 107  ?UIBC Latest Units: ug/dL 192  ?TIBC Latest Ref Range: 250 - 450 ug/dL 299  ?Saturation Ratios Latest Ref Range: 17.9 - 39.5 % 36  ?Ferritin Latest Ref Range: 24 - 336 ng/mL 341 (H)  ? ? Ref. Range 02/13/2021 00:00  ?Sodium Latest Ref Range: 137 - 147  140  ?Potassium Latest Ref Range: 3.4 - 5.3  3.5  ?Chloride Latest Ref Range: 99 - 108  105  ?CO2 Latest Ref Range: 13 - 22  23 (A)  ?Glucose Unknown 144   ?BUN Latest Ref Range: 4 - 21  68 (A)  ?Creatinine Latest Ref Range: 0.6 - 1.3  2.2 (A)  ?Calcium Latest Ref Range: 8.7 - 10.7  9.0  ?Alkaline Phosphatase Latest Ref Range: 25 - 125  48  ?Albumin Latest Ref Range: 3.5 - 5.0  3.6  ?AST Latest Ref Range: 14 - 40  20  ?ALT Latest Ref Range: 10 - 40  14  ?Bilirubin, Total Unknown 0.4  ?WBC Unknown 10.1  ?RBC Latest Ref Range: 3.87 - 5.11  4.24  ?Hemoglobin Latest Ref Range: 13.5 - 17.5  13.4 (A)  ?HCT Latest Ref Range: 41 - 53  39 (A)  ?MCV Latest Ref Range: 80 - 94  92  ?Platelets Latest Ref Range: 150 - 399  247  ? ? ?ASSESSMENT & PLAN:  ?Assessment/Plan:  A 56 y.o. 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  Although his ferritin is much greater than 100, I will arrange for him to be phlebotomized this month and in 3 months.  I will see him back in 6 months to reassess his iron parameters. The patient understands all the plans discussed today and is in agreement with them.   ? ? ?I, Rita Ohara, am acting as scribe for Sean Potter, MD   ? ?I have reviewed this report as typed by the medical scribe, and it is complete and accurate. ? ?Sean Macarthur Critchley, MD ? ? ? ?  ?

## 2021-08-10 ENCOUNTER — Other Ambulatory Visit: Payer: Self-pay | Admitting: Hematology and Oncology

## 2021-08-13 ENCOUNTER — Other Ambulatory Visit: Payer: Self-pay | Admitting: Oncology

## 2021-08-13 ENCOUNTER — Other Ambulatory Visit: Payer: Self-pay

## 2021-08-13 ENCOUNTER — Inpatient Hospital Stay: Payer: Managed Care, Other (non HMO) | Attending: Oncology

## 2021-08-13 LAB — IRON AND TIBC
Iron: 72 ug/dL (ref 45–182)
Saturation Ratios: 26 % (ref 17.9–39.5)
TIBC: 273 ug/dL (ref 250–450)
UIBC: 201 ug/dL

## 2021-08-13 LAB — FERRITIN: Ferritin: 307 ng/mL (ref 24–336)

## 2021-08-13 LAB — CBC AND DIFFERENTIAL
HCT: 34 — AB (ref 41–53)
Hemoglobin: 11.7 — AB (ref 13.5–17.5)
Neutrophils Absolute: 6.11
Platelets: 274 10*3/uL (ref 150–400)
WBC: 9.7

## 2021-08-13 LAB — CBC: RBC: 3.64 — AB (ref 3.87–5.11)

## 2021-08-14 ENCOUNTER — Ambulatory Visit: Payer: Managed Care, Other (non HMO) | Admitting: Oncology

## 2021-08-14 ENCOUNTER — Other Ambulatory Visit: Payer: Self-pay | Admitting: Oncology

## 2021-08-25 ENCOUNTER — Other Ambulatory Visit: Payer: Self-pay | Admitting: Cardiology

## 2021-09-01 IMAGING — US US EXTREM LOW*R* LIMITED
1 series · 11 of 11 positions shown · non-contrast
Comparison: 10/30/2020

CLINICAL DATA: Puncture wound right foot, cellulitis

EXAM:
ULTRASOUND RIGHT LOWER EXTREMITY LIMITED
TECHNIQUE: Ultrasound examination of the lower extremity soft tissues was
performed in the area of clinical concern.

[Series 1: us left lower extrem ltd soft tissue non vascular · 11 acquisitions, 11 frames shown]
[im 1/11]
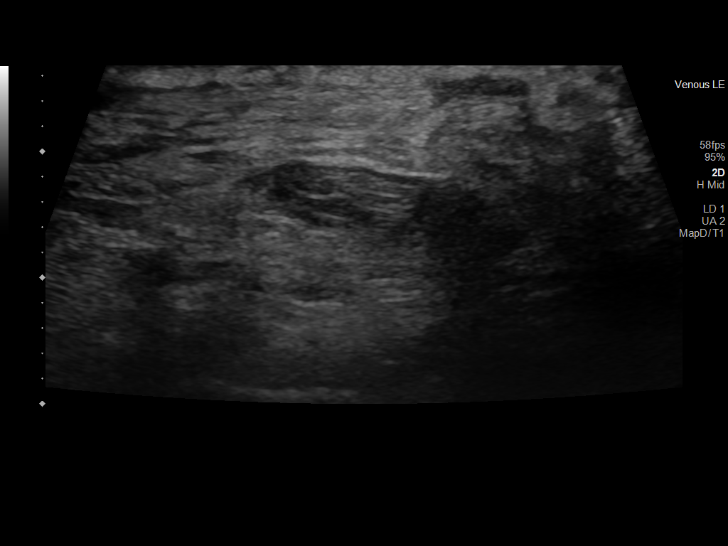
[im 2/11]
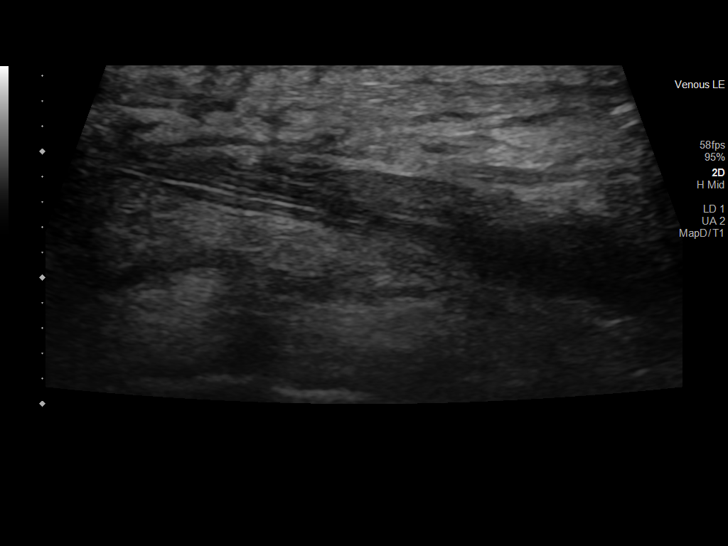
[im 3/11]
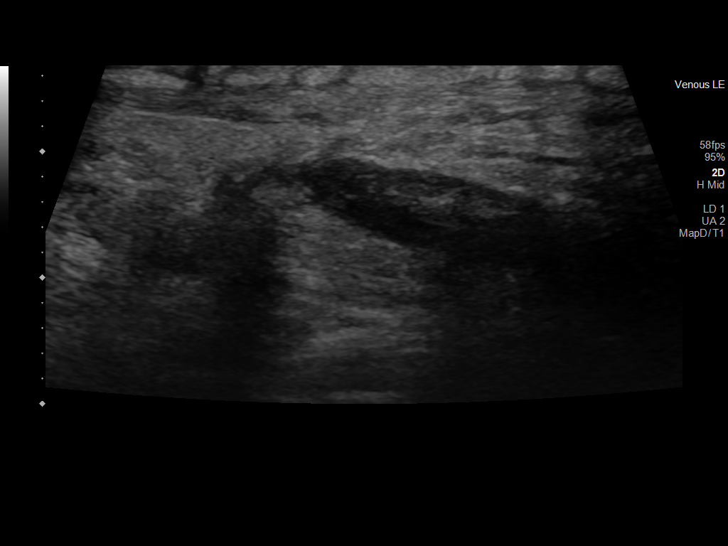
[im 4/11]
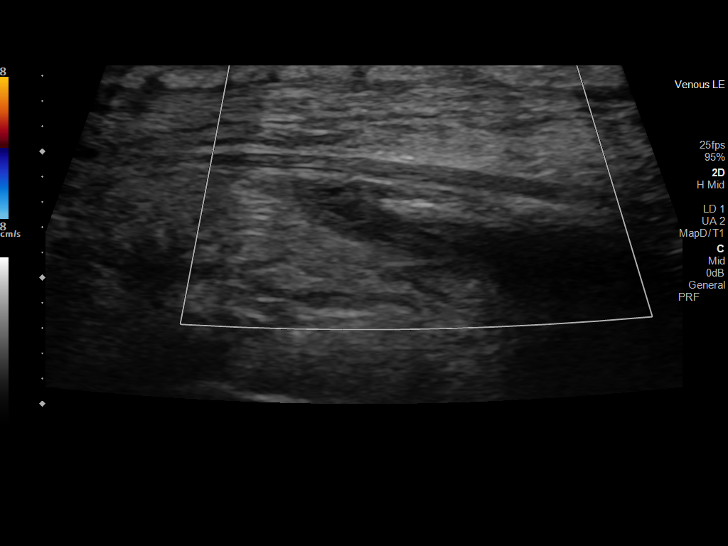
[im 5/11]
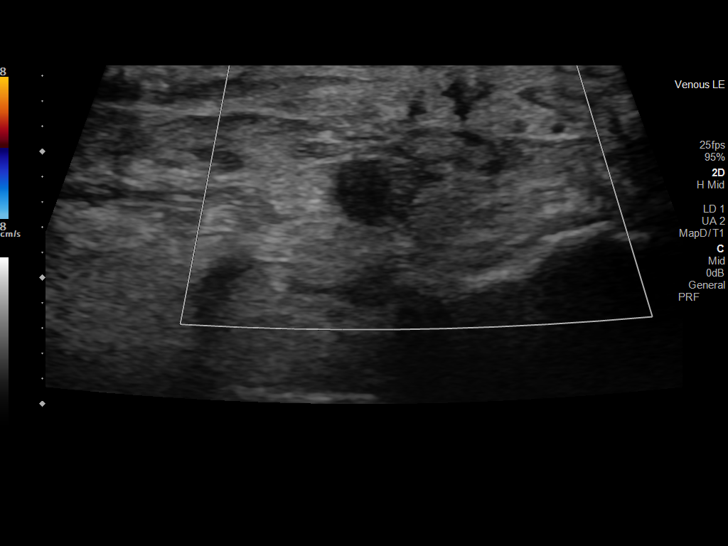
[im 6/11]
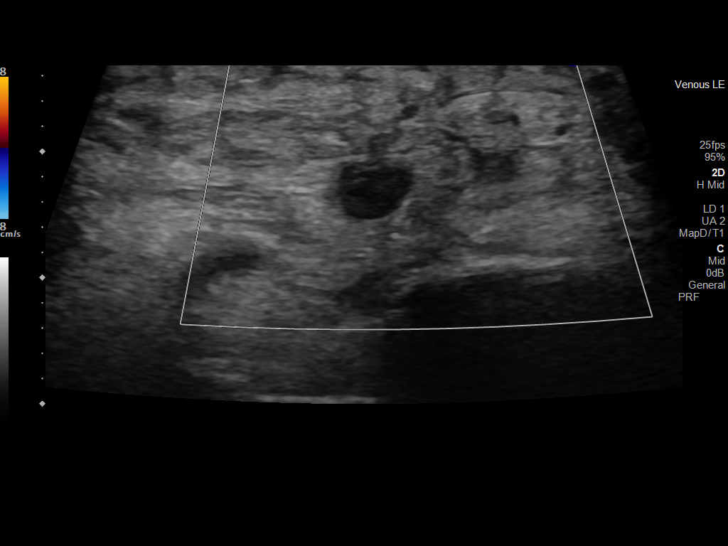
[im 7/11]
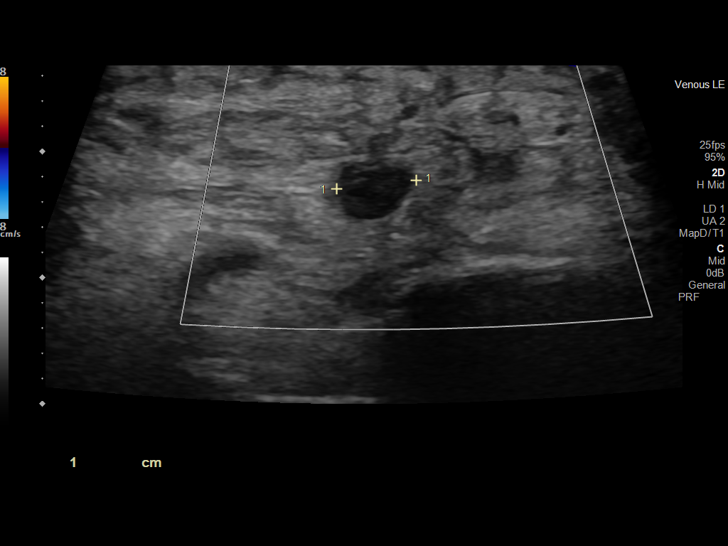
[im 8/11]
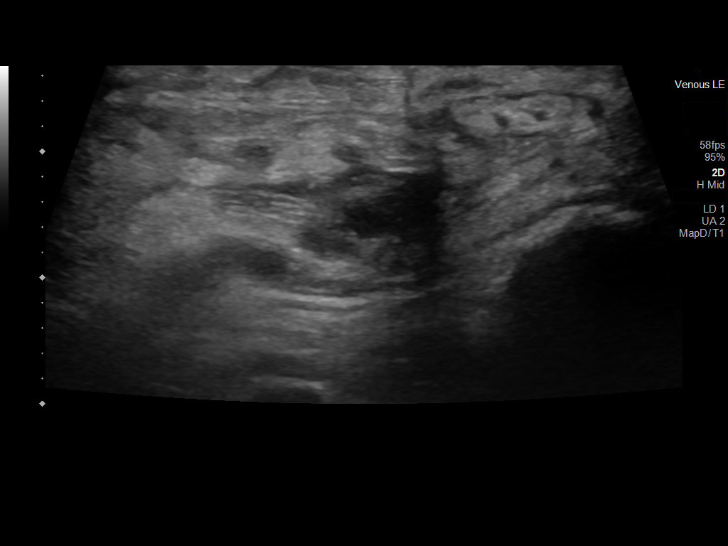
[im 9/11]
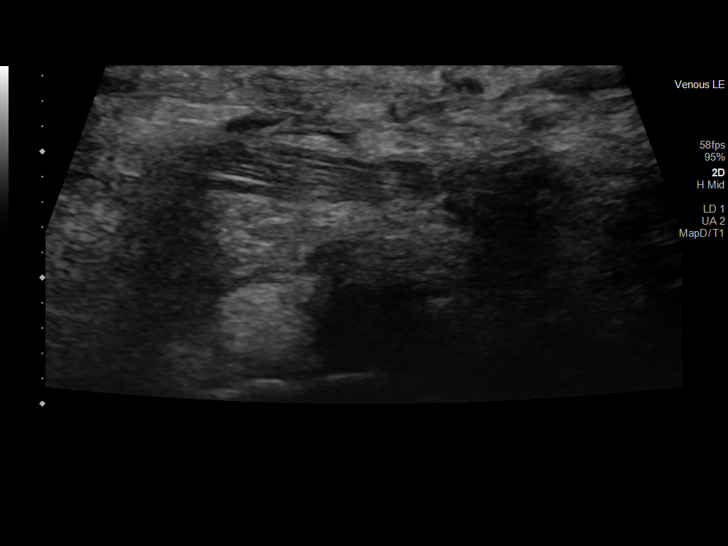
[im 10/11]
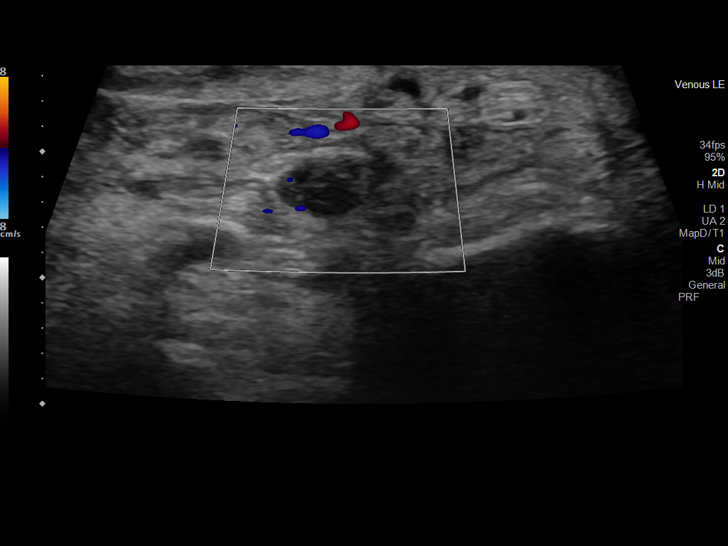
[im 11/11]
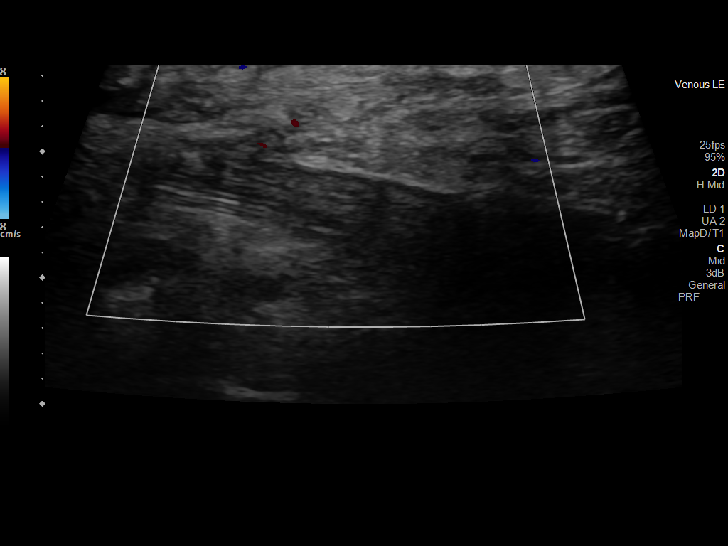

[11 of 11 positions shown; findings below may reference images not displayed]

FINDINGS: Sonographic evaluation of the plantar aspect of the right forefoot
was performed at the site of the puncture wound. There is a 6 mm
fluid collection deep to the puncture wound, located approximately
10 mm deep to the skin surface, with small tract extending toward
the skin surface. Superficial subcutaneous edema is identified. I do
not see any foreign body.
IMPRESSION: 1. 6 mm fluid collection in the plantar right forefoot at the site
of the puncture wound. This is located approximately 10 mm deep to
the skin surface.
2. No evidence of foreign body.

## 2021-09-21 ENCOUNTER — Encounter: Payer: Self-pay | Admitting: Cardiology

## 2021-09-21 ENCOUNTER — Ambulatory Visit (INDEPENDENT_AMBULATORY_CARE_PROVIDER_SITE_OTHER): Payer: PRIVATE HEALTH INSURANCE | Admitting: Cardiology

## 2021-09-21 VITALS — BP 174/90 | HR 72 | Ht 77.0 in | Wt 384.8 lb

## 2021-09-21 DIAGNOSIS — N183 Chronic kidney disease, stage 3 unspecified: Secondary | ICD-10-CM

## 2021-09-21 DIAGNOSIS — G4733 Obstructive sleep apnea (adult) (pediatric): Secondary | ICD-10-CM

## 2021-09-21 DIAGNOSIS — I5032 Chronic diastolic (congestive) heart failure: Secondary | ICD-10-CM

## 2021-09-21 DIAGNOSIS — I129 Hypertensive chronic kidney disease with stage 1 through stage 4 chronic kidney disease, or unspecified chronic kidney disease: Secondary | ICD-10-CM

## 2021-09-21 DIAGNOSIS — E785 Hyperlipidemia, unspecified: Secondary | ICD-10-CM

## 2021-09-21 DIAGNOSIS — Z86711 Personal history of pulmonary embolism: Secondary | ICD-10-CM

## 2021-09-21 MED ORDER — RIVAROXABAN 15 MG PO TABS: 15.0000 mg | ORAL_TABLET | Freq: Every day | ORAL | 1 refills | Status: DC

## 2021-09-21 MED ORDER — CLONIDINE HCL 0.1 MG PO TABS: 0.1000 mg | ORAL_TABLET | Freq: Every day | ORAL | 1 refills | Status: DC

## 2021-09-21 NOTE — Progress Notes (Signed)
?Cardiology Office Note:   ? ?Date:  09/21/2021  ? ?ID:  Sean Valentine, DOB 05/05/1966, MRN [id number] ? ?PCP:  Raina Mina., MD  ?Cardiologist:  Jenne Campus, MD   ? ?Referring MD: Raina Mina., MD  ? ?Chief Complaint  ?Patient presents with  ? Follow-up  ?I am doing fine ? ?History of Present Illness:   ? ?Sean Valentine is a 56 y.o. male with past medical history significant for diabetes mellitus, essential hypertension, obstructive sleep apnea, hemochromatosis, morbid obesity.  Diastolic congestive heart failure.  She is coming for regular follow-up.  Overall he is doing well.  He does have some swelling of lower extremities but he manages with elastic stockings.  He did also have chronic kidney failure.  Denies have any chest pain tightness squeezing pressure burning chest interestingly he described the fact that he is getting tired walking down stairs or going upstairs. ? ?Past Medical History:  ?Diagnosis Date  ? Anemia, chronic disease 05/26/2018  ? Formatting of this note might be different from the original. Eval with [facility] and phlebotomy for Hemochromotosis.  ? Anticoagulant long-term use 11/26/2016  ? Arthritis of right foot 05/08/2016  ? Atypical chest pain 09/13/2020  ? Bacteremia due to group B Streptococcus 11/03/2020  ? Benign hypertension with CKD (chronic kidney disease) stage III ([place]) 07/12/2019  ? Bilateral leg edema   ? Cellulitis 11/26/2020  ? Chest pain at rest 08/16/2020  ? CHF (congestive heart failure) ([place]) 03/05/2012  ? COVID-19 virus infection 02/09/2020  ? Deformity of toe 08/01/2020  ? Diabetic ulcer of toe of right foot associated with type 2 diabetes mellitus, with fat layer exposed ([place]) 01/10/2020  ? Formatting of this note might be different from the original. Distal tuft right fifth toe  ? Dyslipidemia 03/05/2012  ? Essential hypertension 11/26/2016  ? GERD (gastroesophageal reflux disease) 08/01/2020  ? Gouty arthritis of left hand 01/15/2017  ? High risk medication use 11/26/2016  ? History of  COVID-19 05/05/2020  ? Formatting of this note might be different from the original. 02/2020  ? History of diabetic ulcer of foot 04/04/2021  ? History of pulmonary embolism 11/26/2016  ? Formatting of this note might be different from the original. Felt severe with permanent anticoagulation and Filter placed because of burden.  ? Hypertension   ? Localized edema 03/05/2012  ? LVH (left ventricular hypertrophy) 05/26/2018  ? Formatting of this note might be different from the original. 04/2018 EF 555-60%  ? Malaise and fatigue 11/26/2016  ? Medication monitoring encounter 11/15/2020  ? 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 Formatting of this note might be different from the original. Possible. Seen on SPEP 05/2018  ? Mild persistent asthma 11/26/2016  ? Mixed hyperlipidemia 11/26/2016  ? Morbid (severe) obesity due to excess calories ([place]) 03/05/2012  ? Morbid obesity with body mass index (BMI) of 40.0 to 49.9 ([place]) 11/26/2016  ? Nail dystrophy 05/20/2018  ? Nephrolithiasis 03/05/2012  ? Nonalcoholic steatohepatitis ([facility]) 05/26/2018  ? Obesity, Class III, BMI 40-49.9 (morbid obesity) ([place]) 11/26/2016  ? Other hemochromatosis 03/21/2020  ? PICC (peripherally inserted central catheter) in place 12/12/2020  ? Posterior tibial tendinitis of right lower extremity 08/01/2020  ? Pre-ulcerative calluses 06/18/2019  ? Primary central sleep apnea 11/26/2016  ? Formatting of this note might be different from the original. bipap  ? Sepsis ([place]) 10/30/2020  ? Sepsis due to cellulitis ([place]) 10/30/2020  ? Sleep apnea 03/05/2012  ? Formatting of this note  might be different from the original. [place].  ? Spinal stenosis of lumbar region 05/26/2018  ? Status post amputation of toe of right foot ([place]) 07/26/2015  ? Subfibular impingement, right 03/13/2016  ? Tinea pedis 08/01/2020  ? Type 2 diabetes mellitus with stage 3b chronic kidney disease, with long-term current use of insulin ([place]) 07/12/2019  ? Formatting of this note might  be different from the original. IMO 10/01 Updates  Formatting of this note might be different from the original. 2000  ? Ulcer of right foot with fat layer exposed ([place]) 12/05/2020  ? ? ?Past Surgical History:  ?Procedure Laterality Date  ? BUBBLE STUDY  11/03/2020  ? Procedure: BUBBLE STUDY;  Surgeon: Geralynn Rile, MD;  Location: [facility];  Service: Cardiovascular;;  ? CHOLECYSTECTOMY    ? CYSTOSCOPY W/ URETERAL STENT PLACEMENT Left 05/06/2005  ? FOOT SURGERY Right   ? Big toe amputation  ? IVC FILTER INSERTION  08/2016  ? KIDNEY STONE SURGERY    ? SHOULDER SURGERY Left   ? TEE WITHOUT CARDIOVERSION N/A 11/03/2020  ? Procedure: TRANSESOPHAGEAL ECHOCARDIOGRAM (TEE);  Surgeon: Geralynn Rile, MD;  Location: [facility];  Service: Cardiovascular;  Laterality: N/A;  ? ? ?Current Medications: ?Current Meds  ?Medication Sig  ? carvedilol (COREG) 25 MG tablet Take 25 mg by mouth 2 (two) times daily with a meal.  ? empagliflozin (JARDIANCE) 25 MG TABS tablet Take 25 mg by mouth daily.  ? ergocalciferol (VITAMIN D2) 1.25 MG (50000 UT) capsule Take 1 capsule by mouth every Monday.  ? hydrALAZINE (APRESOLINE) 50 MG tablet Take 50 mg by mouth 3 (three) times daily.  ? insulin aspart (FIASP FLEXTOUCH) 100 UNIT/ML FlexTouch Pen Inject 20 Units into the skin 3 (three) times daily. Inject 20 units into the skin before each meal  ? insulin regular human CONCENTRATED (HUMULIN R) 500 UNIT/ML injection Inject 30 Units into the skin 3 (three) times daily with meals.  ? isosorbide mononitrate (IMDUR) 30 MG 24 hr tablet TAKE 1 TABLET BY MOUTH EVERY DAY (Patient taking differently: Take 30 mg by mouth daily.)  ? lisinopril-hydrochlorothiazide (ZESTORETIC) 20-12.5 MG tablet Take 1 tablet by mouth daily.  ? nitroGLYCERIN (NITROSTAT) 0.4 MG SL tablet Place 1 tablet (0.4 mg total) under the tongue every 5 (five) minutes as needed for chest pain.  ? rivaroxaban (XARELTO) 20 MG TABS tablet Take 20 mg by mouth daily with supper.   ? torsemide (DEMADEX) 20 MG tablet Take 20 mg by mouth daily as needed (for swelling).  ? vitamin B-12 (CYANOCOBALAMIN) 1000 MCG tablet Take 1,000 mcg by mouth every Monday.  ?  ? ?Allergies:   Amlodipine  ? ?Social History  ? ?Socioeconomic History  ? Marital status: Married  ?  Spouse name: Not on file  ? Number of children: Not on file  ? Years of education: Not on file  ? Highest education level: Not on file  ?Occupational History  ? Not on file  ?Tobacco Use  ? Smoking status: Former  ?  Years: 1.00  ?  Types: Cigarettes  ?  Quit date: 53  ?  Years since quitting: 37.3  ? Smokeless tobacco: Never  ?Substance and Sexual Activity  ? Alcohol use: Not on file  ? Drug use: Not on file  ? Sexual activity: Not on file  ?Other Topics Concern  ? Not on file  ?Social History Narrative  ? Not on file  ? ?Social Determinants of Health  ? ?Financial Resource Strain:  Not on file  ?Food Insecurity: Not on file  ?Transportation Needs: Not on file  ?Physical Activity: Not on file  ?Stress: Not on file  ?Social Connections: Not on file  ?  ? ?Family History: ?The patient's family history includes Alzheimer's disease in his mother; Cancer in his mother; Clotting disorder in his mother; Diabetes in his father and mother; Stroke in his mother. ?ROS:   ?Please see the history of present illness.    ?All 14 point review of systems negative except as described per history of present illness ? ?EKGs/Labs/Other Studies Reviewed:   ? ? ? ?Recent Labs: ?10/30/2020: B Natriuretic Peptide 37.3 ?11/03/2020: Magnesium 2.0 ?02/13/2021: ALT 14; BUN 68; Creatinine 2.2; Potassium 3.5; Sodium 140 ?08/13/2021: Hemoglobin 11.7; Platelets 274  ?Recent Lipid Panel ?No results found for: CHOL, TRIG, HDL, CHOLHDL, VLDL, LDLCALC, LDLDIRECT ? ?Physical Exam:   ? ?VS:  BP (!) 174/90 (BP Location: Right Arm, Patient Position: Sitting)   Pulse 72   Ht '6\' 5"'$  (1.956 m)   Wt (!) 384 lb 12.8 oz (174.5 kg)   SpO2 95%   BMI 45.63 kg/m?    ? ?Wt Readings from  Last 3 Encounters:  ?09/21/21 (!) 384 lb 12.8 oz (174.5 kg)  ?02/15/21 (!) 380 lb 1.9 oz (172.4 kg)  ?02/13/21 (!) 383 lb 9.6 oz (174 kg)  ?  ? ?GEN:  Well nourished, well developed in no acute distress ?HEEN

## 2021-09-21 NOTE — Patient Instructions (Signed)
Medication Instructions:  ?Your physician has recommended you make the following change in your medication: Start Clonidine 0.1 mg once daily, and reducing Xarelto from 20 mg to 15 mg daily.  ?*If you need a refill on your cardiac medications before your next appointment, please call your pharmacy* ? ? ?Lab Work: ?None ?If you have labs (blood work) drawn today and your tests are completely normal, you will receive your results only by: ?MyChart Message (if you have MyChart) OR ?A paper copy in the mail ?If you have any lab test that is abnormal or we need to change your treatment, we will call you to review the results. ? ? ?Testing/Procedures: ?Your physician has requested that you have an echocardiogram. Echocardiography is a painless test that uses sound waves to create images of your heart. It provides your doctor with information about the size and shape of your heart and how well your heart?s chambers and valves are working. This procedure takes approximately one hour. There are no restrictions for this procedure. ? ? ? ?Follow-Up: ?At [facility], you and your health needs are our priority.  As part of our continuing mission to provide you with exceptional heart care, we have created designated Provider Care Teams.  These Care Teams include your primary Cardiologist (physician) and Advanced Practice Providers (APPs -  Physician Assistants and Nurse Practitioners) who all work together to provide you with the care you need, when you need it. ? ?We recommend signing up for the patient portal called "MyChart".  Sign up information is provided on this After Visit Summary.  MyChart is used to connect with patients for Virtual Visits (Telemedicine).  Patients are able to view lab/test results, encounter notes, upcoming appointments, etc.  Non-urgent messages can be sent to your provider as well.   ?To learn more about what you can do with MyChart, go to [email or web address].   ? ?Your next appointment:   ?6  month(s) ? ?The format for your next appointment:   ?In Person ? ?Provider:   ?Jenne Campus, MD  ? ? ?Other Instructions ? ? ?Important Information About Sugar ? ? ? ? ?  ?

## 2021-09-21 NOTE — Addendum Note (Signed)
Addended by: Darrel Reach on: 09/21/2021 11:41 AM ? ? Modules accepted: Orders ? ?

## 2021-10-02 ENCOUNTER — Ambulatory Visit (INDEPENDENT_AMBULATORY_CARE_PROVIDER_SITE_OTHER): Payer: PRIVATE HEALTH INSURANCE

## 2021-10-02 DIAGNOSIS — Z86711 Personal history of pulmonary embolism: Secondary | ICD-10-CM

## 2021-10-02 DIAGNOSIS — I129 Hypertensive chronic kidney disease with stage 1 through stage 4 chronic kidney disease, or unspecified chronic kidney disease: Secondary | ICD-10-CM | POA: Diagnosis not present

## 2021-10-02 DIAGNOSIS — N183 Chronic kidney disease, stage 3 unspecified: Secondary | ICD-10-CM | POA: Diagnosis not present

## 2021-10-02 DIAGNOSIS — I5032 Chronic diastolic (congestive) heart failure: Secondary | ICD-10-CM

## 2021-10-02 LAB — ECHOCARDIOGRAM COMPLETE
Area-P 1/2: 2.87 cm2
S' Lateral: 3 cm

## 2021-10-03 ENCOUNTER — Encounter: Payer: Self-pay | Admitting: Oncology

## 2021-10-04 ENCOUNTER — Telehealth: Payer: Self-pay

## 2021-10-04 NOTE — Telephone Encounter (Signed)
Patient notified of results.

## 2021-10-04 NOTE — Telephone Encounter (Signed)
-----   Message from Park Liter, MD sent at 10/03/2021  9:35 PM EDT ----- ?Echocardiogram showed normal left ventricle ejection fraction, moderate left ventricle hypertrophy, moderate left atrium enlargement overall looks good ?

## 2021-10-15 ENCOUNTER — Other Ambulatory Visit: Payer: Self-pay | Admitting: Cardiology

## 2021-11-13 ENCOUNTER — Other Ambulatory Visit: Payer: Self-pay | Admitting: Cardiology

## 2021-11-13 NOTE — Telephone Encounter (Signed)
Prescription refill request for Xarelto received.  Indication: PE Last office visit: 09/21/2021 Weight: 174.3 kg  Age: 56 yo  Scr: 3.62, 10/13/2021 CrCl: 64 ml/min    Per Dr. Agustin Cree note from 09/21/2021, pt's Xarelto dose decreased to '15mg'$  daily. Refill sent.

## 2021-12-12 LAB — COLOGUARD

## 2022-01-10 LAB — COLOGUARD: COLOGUARD: POSITIVE — AB

## 2022-01-11 DIAGNOSIS — R195 Other fecal abnormalities: Secondary | ICD-10-CM | POA: Insufficient documentation

## 2022-01-19 DIAGNOSIS — J810 Acute pulmonary edema: Secondary | ICD-10-CM | POA: Insufficient documentation

## 2022-01-19 DIAGNOSIS — I161 Hypertensive emergency: Secondary | ICD-10-CM | POA: Insufficient documentation

## 2022-01-21 DIAGNOSIS — E876 Hypokalemia: Secondary | ICD-10-CM | POA: Insufficient documentation

## 2022-01-21 DIAGNOSIS — I2489 Other forms of acute ischemic heart disease: Secondary | ICD-10-CM | POA: Insufficient documentation

## 2022-01-21 DIAGNOSIS — E872 Acidosis, unspecified: Secondary | ICD-10-CM | POA: Insufficient documentation

## 2022-01-21 HISTORY — DX: Acidosis, unspecified: E87.20

## 2022-02-06 ENCOUNTER — Other Ambulatory Visit: Payer: Self-pay | Admitting: Cardiology

## 2022-02-06 NOTE — Telephone Encounter (Signed)
Prescription refill request for Xarelto received.  Indication: PE Last office visit:  09/21/21  Nelta Numbers MD Weight: 174.5kg Age: 56 Scr: 4.08 on 02/01/22 CrCl: 48.71  Based on above findings Xarelto 25m daily is the appropriate dose.  Refill approved.

## 2022-02-11 DIAGNOSIS — T7840XA Allergy, unspecified, initial encounter: Secondary | ICD-10-CM | POA: Insufficient documentation

## 2022-02-11 DIAGNOSIS — T782XXA Anaphylactic shock, unspecified, initial encounter: Secondary | ICD-10-CM | POA: Insufficient documentation

## 2022-02-11 DIAGNOSIS — N179 Acute kidney failure, unspecified: Secondary | ICD-10-CM | POA: Insufficient documentation

## 2022-02-11 DIAGNOSIS — N2581 Secondary hyperparathyroidism of renal origin: Secondary | ICD-10-CM | POA: Insufficient documentation

## 2022-02-11 DIAGNOSIS — L299 Pruritus, unspecified: Secondary | ICD-10-CM | POA: Insufficient documentation

## 2022-02-11 DIAGNOSIS — R52 Pain, unspecified: Secondary | ICD-10-CM | POA: Insufficient documentation

## 2022-04-03 DIAGNOSIS — R0600 Dyspnea, unspecified: Secondary | ICD-10-CM | POA: Insufficient documentation

## 2022-04-11 ENCOUNTER — Other Ambulatory Visit: Payer: Self-pay | Admitting: *Deleted

## 2022-04-11 DIAGNOSIS — N186 End stage renal disease: Secondary | ICD-10-CM

## 2022-04-15 ENCOUNTER — Telehealth: Payer: Self-pay | Admitting: Cardiology

## 2022-04-15 MED ORDER — CARVEDILOL 25 MG PO TABS: 25.0000 mg | ORAL_TABLET | Freq: Two times a day (BID) | ORAL | 1 refills | Status: AC

## 2022-04-15 NOTE — Telephone Encounter (Signed)
STAT if HR is under 50 or over 120 (normal HR is 60-100 beats per minute)  What is your heart rate? 90's  Do you have a log of your heart rate readings (document readings)? 90's  Do you have any other symptoms? Says he "feels funny"   Patient's wife states the patient's HR has been in the 55's for the past couple days and he feels "funny". She states he is also out of carvedilol.

## 2022-04-15 NOTE — Telephone Encounter (Signed)
Spoke with Sean Valentine per DPR who states that the pt's heart rate has been staying in the 90's even with the Carvedilol. Appointment has been made and refill has been sent.

## 2022-04-16 ENCOUNTER — Encounter: Payer: Self-pay | Admitting: Oncology

## 2022-04-17 ENCOUNTER — Ambulatory Visit (INDEPENDENT_AMBULATORY_CARE_PROVIDER_SITE_OTHER)
Admission: RE | Admit: 2022-04-17 | Discharge: 2022-04-17 | Disposition: A | Discharge status: Home / Self Care | Payer: 59 | Source: Ambulatory Visit | Attending: Vascular Surgery | Admitting: Vascular Surgery

## 2022-04-17 ENCOUNTER — Ambulatory Visit: Payer: 59 | Admitting: Vascular Surgery

## 2022-04-17 ENCOUNTER — Ambulatory Visit (HOSPITAL_COMMUNITY)
Admission: RE | Admit: 2022-04-17 | Discharge: 2022-04-17 | Disposition: A | Discharge status: Home / Self Care | Payer: 59 | Source: Ambulatory Visit | Attending: Vascular Surgery | Admitting: Vascular Surgery

## 2022-04-17 ENCOUNTER — Encounter: Payer: Self-pay | Admitting: Vascular Surgery

## 2022-04-17 VITALS — BP 131/75 | HR 89 | Temp 98.0°F | Resp 20 | Ht 77.0 in | Wt 325.0 lb

## 2022-04-17 DIAGNOSIS — N186 End stage renal disease: Secondary | ICD-10-CM

## 2022-04-17 NOTE — Progress Notes (Signed)
Patient ID: Sean Valentine, male   DOB: Oct 08, 1965, 56 y.o.   MRN: [id number]  Reason for Consult: New Patient (Initial Visit)   Referred by Raina Mina., MD  Subjective:     HPI:  Sean Valentine is a 56 y.o. male with end-stage renal disease currently on dialysis via catheter.  He does not have any history of upper extremity or chest or breast surgery in the past.  He is left-hand dominant.  He does have a history of an IVC filter currently anticoagulated for history of DVT and pulmonary embolism.  He walks with help of a cane.  He continues to work in [employer].  He is on dialysis Tuesdays, Thursdays and Saturdays.  Past Medical History:  Diagnosis Date   Anemia, chronic disease 05/26/2018   Formatting of this note might be different from the original. Eval with [facility] and phlebotomy for Hemochromotosis.   Anticoagulant long-term use 11/26/2016   Arthritis of right foot 05/08/2016   Atypical chest pain 09/13/2020   Bacteremia due to group B Streptococcus 11/03/2020   Benign hypertension with CKD (chronic kidney disease) stage III ([place]) 07/12/2019   Bilateral leg edema    Cellulitis 11/26/2020   Chest pain at rest 08/16/2020   CHF (congestive heart failure) ([place]) 03/05/2012   COVID-19 virus infection 02/09/2020   Deformity of toe 08/01/2020   Diabetic ulcer of toe of right foot associated with type 2 diabetes mellitus, with fat layer exposed ([place]) 01/10/2020   Formatting of this note might be different from the original. Distal tuft right fifth toe   Dyslipidemia 03/05/2012   Essential hypertension 11/26/2016   GERD (gastroesophageal reflux disease) 08/01/2020   Gouty arthritis of left hand 01/15/2017   High risk medication use 11/26/2016   History of COVID-19 05/05/2020   Formatting of this note might be different from the original. 02/2020   History of diabetic ulcer of foot 04/04/2021   History of pulmonary embolism 11/26/2016   Formatting of this note might be different from the original. Felt severe  with permanent anticoagulation and Filter placed because of burden.   Hypertension    Localized edema 03/05/2012   LVH (left ventricular hypertrophy) 05/26/2018   Formatting of this note might be different from the original. 04/2018 EF 555-60%   Malaise and fatigue 11/26/2016   Medication monitoring encounter 11/15/2020   MGUS (monoclonal gammopathy of unknown significance) 06/03/2018   Formatting of this note might be different from the original. Possible. Seen on SPEP 05/2018   Mild persistent asthma 11/26/2016   Mixed hyperlipidemia 11/26/2016   Morbid (severe) obesity due to excess calories ([place]) 03/05/2012   Morbid obesity with body mass index (BMI) of 40.0 to 49.9 ([place]) 11/26/2016   Nail dystrophy 05/20/2018   Nephrolithiasis 41/11/3843   Nonalcoholic steatohepatitis (NASH) 05/26/2018   Obesity, Class III, BMI 40-49.9 (morbid obesity) ([place]) 11/26/2016   Other hemochromatosis 03/21/2020   PICC (peripherally inserted central catheter) in place 12/12/2020   Posterior tibial tendinitis of right lower extremity 08/01/2020   Pre-ulcerative calluses 06/18/2019   Primary central sleep apnea 11/26/2016   Formatting of this note might be different from the original. bipap   Sepsis ([place]) 10/30/2020   Sepsis due to cellulitis ([place]) 10/30/2020   Sleep apnea 03/05/2012   Formatting of this note might be different from the original. [place].   Spinal stenosis of lumbar region 05/26/2018   Status post amputation of toe of right foot ([place]) 07/26/2015   Subfibular impingement, right 03/13/2016  Tinea pedis 08/01/2020   Type 2 diabetes mellitus with stage 3b chronic kidney disease, with long-term current use of insulin ([place]) 07/12/2019   Formatting of this note might be different from the original. IMO 10/01 Updates  Formatting of this note might be different from the original. 2000   Ulcer of right foot with fat layer exposed ([place]) 12/05/2020   Family History  Problem Relation Age of Onset   Cancer Mother     Clotting disorder Mother    Stroke Mother    Alzheimer's disease Mother    Diabetes Mother    Diabetes Father    Past Surgical History:  Procedure Laterality Date   BUBBLE STUDY  11/03/2020   Procedure: BUBBLE STUDY;  Surgeon: Geralynn Rile, MD;  Location: [place];  Service: Cardiovascular;;   CHOLECYSTECTOMY     CYSTOSCOPY W/ URETERAL STENT PLACEMENT Left 05/06/2005   FOOT SURGERY Right    Big toe amputation   IVC FILTER INSERTION  08/2016   KIDNEY STONE SURGERY     SHOULDER SURGERY Left    TEE WITHOUT CARDIOVERSION N/A 11/03/2020   Procedure: TRANSESOPHAGEAL ECHOCARDIOGRAM (TEE);  Surgeon: Geralynn Rile, MD;  Location: [facility];  Service: Cardiovascular;  Laterality: N/A;    Short Social History:  Social History   Tobacco Use   Smoking status: Former    Years: 1.00    Types: Cigarettes    Quit date: 1986    Years since quitting: 37.8   Smokeless tobacco: Never  Substance Use Topics   Alcohol use: Not on file    Allergies  Allergen Reactions   Amlodipine Swelling    Current Outpatient Medications  Medication Sig Dispense Refill   carvedilol (COREG) 25 MG tablet Take 1 tablet (25 mg total) by mouth 2 (two) times daily with a meal. 90 tablet 1   cloNIDine (CATAPRES) 0.1 MG tablet TAKE 1 TABLET BY MOUTH EVERY DAY (Patient not taking: Reported on 04/17/2022) 90 tablet 1   empagliflozin (JARDIANCE) 25 MG TABS tablet Take 25 mg by mouth daily. (Patient not taking: Reported on 04/17/2022)     ergocalciferol (VITAMIN D2) 1.25 MG (50000 UT) capsule Take 1 capsule by mouth every Monday.     hydrALAZINE (APRESOLINE) 50 MG tablet Take 50 mg by mouth 3 (three) times daily.     insulin aspart (FIASP FLEXTOUCH) 100 UNIT/ML FlexTouch Pen Inject 20 Units into the skin 3 (three) times daily. Inject 20 units into the skin before each meal     insulin regular human CONCENTRATED (HUMULIN R) 500 UNIT/ML injection Inject 30 Units into the skin 3 (three) times daily with  meals.     isosorbide mononitrate (IMDUR) 30 MG 24 hr tablet TAKE 1 TABLET BY MOUTH EVERY DAY 90 tablet 2   nitroGLYCERIN (NITROSTAT) 0.4 MG SL tablet Place 1 tablet (0.4 mg total) under the tongue every 5 (five) minutes as needed for chest pain. 25 tablet 11   vitamin B-12 (CYANOCOBALAMIN) 1000 MCG tablet Take 1,000 mcg by mouth every Monday.     No current facility-administered medications for this visit.    Review of Systems  Constitutional:  Constitutional negative. HENT: HENT negative.  Eyes: Eyes negative.  Respiratory: Respiratory negative.  Cardiovascular: Cardiovascular negative.  GI: Gastrointestinal negative.  Musculoskeletal: Musculoskeletal negative.  Skin: Skin negative.  Neurological: Neurological negative. Hematologic: Hematologic/lymphatic negative.  Psychiatric: Psychiatric negative.        Objective:  Objective   Vitals:   04/17/22 1458  BP: 131/75  Pulse: 89  Resp: 20  Temp: 98 F (36.7 C)  SpO2: 96%  Weight: (!) 325 lb (147.4 kg)  Height: 6' 5"  (1.956 m)   Body mass index is 38.54 kg/m.  Physical Exam HENT:     Head: Normocephalic.  Eyes:     Pupils: Pupils are equal, round, and reactive to light.  Cardiovascular:     Pulses:          Radial pulses are 2+ on the right side and 2+ on the left side.  Abdominal:     General: Abdomen is flat.     Palpations: Abdomen is soft.  Musculoskeletal:        General: Normal range of motion.     Right lower leg: No edema.     Left lower leg: No edema.  Skin:    General: Skin is warm.     Capillary Refill: Capillary refill takes less than 2 seconds.  Neurological:     General: No focal deficit present.     Mental Status: He is alert.  Psychiatric:        Mood and Affect: Mood normal.        Thought Content: Thought content normal.        Judgment: Judgment normal.     Data: Right Cephalic   Diameter (cm)Depth (cm)Findings   +-----------------+-------------+----------+---------+   Shoulder            0.48        2.39              +-----------------+-------------+----------+---------+  Prox upper arm       0.40        1.65              +-----------------+-------------+----------+---------+  Mid upper arm        0.44        0.73              +-----------------+-------------+----------+---------+  Dist upper arm       0.56        0.72              +-----------------+-------------+----------+---------+  Antecubital fossa    0.62        0.56              +-----------------+-------------+----------+---------+  Prox forearm         0.32        0.94   branching  +-----------------+-------------+----------+---------+  Mid forearm          0.32        0.51              +-----------------+-------------+----------+---------+  Wrist               0.27        0.63              +-----------------+-------------+----------+---------+   +-----------------+-------------+----------+--------+  Left Cephalic    Diameter (cm)Depth (cm)Findings  +-----------------+-------------+----------+--------+  Shoulder            0.43        2.10             +-----------------+-------------+----------+--------+  Prox upper arm       0.43        1.79             +-----------------+-------------+----------+--------+  Mid upper arm        0.47        0.95             +-----------------+-------------+----------+--------+  Dist upper arm       0.53        0.78             +-----------------+-------------+----------+--------+  Antecubital fossa    0.44        0.48             +-----------------+-------------+----------+--------+  Prox forearm         0.29        0.34             +-----------------+-------------+----------+--------+  Mid forearm          0.21        0.33             +-----------------+-------------+----------+--------+  Wrist               0.16        0.24              +-----------------+-------------+----------+--------+       Assessment/Plan:    56 year old male with end-stage renal disease currently dialyzing via catheter.  He is left-hand dominant.  Plan will be for right arm AV fistula versus graft on a nondialysis day in the near future and we will hold Eliquis 48 hours prior to surgery.  I have discussed the risk benefits alternatives and expected recovery.  Need for at least 1 day off of work after surgery which would be a dialysis day for him but should be able to return on postoperative day 2.  He demonstrates good understanding we will get him scheduled soon.   Waynetta Sandy MD [facility]

## 2022-04-19 ENCOUNTER — Other Ambulatory Visit: Payer: Self-pay | Admitting: *Deleted

## 2022-04-19 DIAGNOSIS — N186 End stage renal disease: Secondary | ICD-10-CM

## 2022-04-22 ENCOUNTER — Ambulatory Visit: Payer: 59 | Attending: Cardiology

## 2022-04-22 ENCOUNTER — Ambulatory Visit: Payer: 59 | Attending: Cardiology | Admitting: Cardiology

## 2022-04-22 ENCOUNTER — Encounter: Payer: Self-pay | Admitting: Cardiology

## 2022-04-22 VITALS — BP 120/72 | HR 87 | Ht 76.0 in | Wt 336.8 lb

## 2022-04-22 DIAGNOSIS — Z992 Dependence on renal dialysis: Secondary | ICD-10-CM

## 2022-04-22 DIAGNOSIS — I120 Hypertensive chronic kidney disease with stage 5 chronic kidney disease or end stage renal disease: Secondary | ICD-10-CM

## 2022-04-22 DIAGNOSIS — I10 Essential (primary) hypertension: Secondary | ICD-10-CM

## 2022-04-22 DIAGNOSIS — E1122 Type 2 diabetes mellitus with diabetic chronic kidney disease: Secondary | ICD-10-CM

## 2022-04-22 DIAGNOSIS — Z794 Long term (current) use of insulin: Secondary | ICD-10-CM

## 2022-04-22 DIAGNOSIS — I5032 Chronic diastolic (congestive) heart failure: Secondary | ICD-10-CM

## 2022-04-22 DIAGNOSIS — I517 Cardiomegaly: Secondary | ICD-10-CM

## 2022-04-22 DIAGNOSIS — N1832 Chronic kidney disease, stage 3b: Secondary | ICD-10-CM

## 2022-04-22 DIAGNOSIS — N186 End stage renal disease: Secondary | ICD-10-CM

## 2022-04-22 NOTE — Addendum Note (Signed)
Addended by: Jacobo Forest D on: 04/22/2022 02:47 PM   Modules accepted: Orders

## 2022-04-22 NOTE — Progress Notes (Signed)
Cardiology Office Note:    Date:  04/22/2022   ID:  Sean Valentine, DOB 1965/08/26, MRN [id number]  PCP:  Raina Mina., MD  Cardiologist:  Jenne Campus, MD    Referring MD: Raina Mina., MD   Chief Complaint  Patient presents with   Follow-up    Recently dx w/ Kidney failure stage  4    History of Present Illness:    Sean Valentine is a 56 y.o. male with past medical history significant for longstanding diabetes, essential hypertension, obstructive sleep apnea, hemochromatosis, morbid obesity, diastolic congestive heart failure.  Since the time I seen him last, a lot of issue happened.  He started dialysis, end up coming to the hospital with pulmonary edema was unable to be diuresis eventually ended up being on dialysis now being 3 times a week with double-lumen catheter.  He said he is feeling better complaint 5 weakness fatigue tiredness.  But there is no swelling of lower extremities shortness of breath is much better.  He does have paroxysmal dyspnea.  He is concerned about the fact sometimes he wakes up in the middle of the night with his heart rate speeding up.  He does have CPAP and use it religiously.  Denies have any chest pain tightness squeezing pressure burning chest.  Past Medical History:  Diagnosis Date   Anemia, chronic disease 05/26/2018   Formatting of this note might be different from the original. Eval with [facility] and phlebotomy for Hemochromotosis.   Anticoagulant long-term use 11/26/2016   Arthritis of right foot 05/08/2016   Atypical chest pain 09/13/2020   Bacteremia due to group B Streptococcus 11/03/2020   Benign hypertension with CKD (chronic kidney disease) stage III ([place]) 07/12/2019   Bilateral leg edema    Cellulitis 11/26/2020   Chest pain at rest 08/16/2020   CHF (congestive heart failure) ([place]) 03/05/2012   COVID-19 virus infection 02/09/2020   Deformity of toe 08/01/2020   Diabetic ulcer of toe of right foot associated with type 2 diabetes  mellitus, with fat layer exposed ([place]) 01/10/2020   Formatting of this note might be different from the original. Distal tuft right fifth toe   Dyslipidemia 03/05/2012   Essential hypertension 11/26/2016   GERD (gastroesophageal reflux disease) 08/01/2020   Gouty arthritis of left hand 01/15/2017   High risk medication use 11/26/2016   History of COVID-19 05/05/2020   Formatting of this note might be different from the original. 02/2020   History of diabetic ulcer of foot 04/04/2021   History of pulmonary embolism 11/26/2016   Formatting of this note might be different from the original. Felt severe with permanent anticoagulation and Filter placed because of burden.   Hypertension    Kidney failure    stage 4   Localized edema 03/05/2012   LVH (left ventricular hypertrophy) 05/26/2018   Formatting of this note might be different from the original. 04/2018 EF 555-60%   Malaise and fatigue 11/26/2016   Medication monitoring encounter 38/25/0539   Metabolic acidosis 76/73/4193   MGUS (monoclonal gammopathy of unknown significance) 06/03/2018   Formatting of this note might be different from the original. Possible. Seen on SPEP 05/2018   Mild persistent asthma 11/26/2016   Mixed hyperlipidemia 11/26/2016   Morbid (severe) obesity due to excess calories ([place]) 03/05/2012   Morbid obesity with body mass index (BMI) of 40.0 to 49.9 ([place]) 11/26/2016   Nail dystrophy 05/20/2018   Nephrolithiasis 79/07/4095   Nonalcoholic steatohepatitis (NASH) 05/26/2018   Obesity, Class III,  BMI 40-49.9 (morbid obesity) ([place]) 11/26/2016   Other hemochromatosis 03/21/2020   PICC (peripherally inserted central catheter) in place 12/12/2020   Posterior tibial tendinitis of right lower extremity 08/01/2020   Pre-ulcerative calluses 06/18/2019   Primary central sleep apnea 11/26/2016   Formatting of this note might be different from the original. bipap   Sepsis ([place]) 10/30/2020   Sepsis due to cellulitis ([place])  10/30/2020   Sleep apnea 03/05/2012   Formatting of this note might be different from the original. [place].   Spinal stenosis of lumbar region 05/26/2018   Status post amputation of toe of right foot ([place]) 07/26/2015   Subfibular impingement, right 03/13/2016   Tinea pedis 08/01/2020   Type 2 diabetes mellitus with stage 3b chronic kidney disease, with long-term current use of insulin ([place]) 07/12/2019   Formatting of this note might be different from the original. IMO 10/01 Updates  Formatting of this note might be different from the original. 2000   Ulcer of right foot with fat layer exposed ([place]) 12/05/2020    Past Surgical History:  Procedure Laterality Date   BUBBLE STUDY  11/03/2020   Procedure: BUBBLE STUDY;  Surgeon: Geralynn Rile, MD;  Location: [place];  Service: Cardiovascular;;   CATARACT EXTRACTION, BILATERAL Bilateral    CHOLECYSTECTOMY     CYSTOSCOPY W/ URETERAL STENT PLACEMENT Left 05/06/2005   DIALYSIS/PERMA CATHETER INSERTION     FOOT SURGERY Right    Big toe amputation   IVC FILTER INSERTION  08/2016   KIDNEY STONE SURGERY     SHOULDER SURGERY Left    TEE WITHOUT CARDIOVERSION N/A 11/03/2020   Procedure: TRANSESOPHAGEAL ECHOCARDIOGRAM (TEE);  Surgeon: Geralynn Rile, MD;  Location: [place];  Service: Cardiovascular;  Laterality: N/A;    Current Medications: Current Meds  Medication Sig   acetaminophen (TYLENOL) 500 MG tablet Take 500 mg by mouth every 6 (six) hours as needed for moderate pain or mild pain.   apixaban (ELIQUIS) 5 MG TABS tablet Take 5 mg by mouth 2 (two) times daily.   atorvastatin (LIPITOR) 10 MG tablet Take 10 mg by mouth at bedtime.   carvedilol (COREG) 25 MG tablet Take 1 tablet (25 mg total) by mouth 2 (two) times daily with a meal.   empagliflozin (JARDIANCE) 25 MG TABS tablet Take 25 mg by mouth daily.   ergocalciferol (VITAMIN D2) 1.25 MG (50000 UT) capsule Take 1 capsule by mouth every Monday.   furosemide  (LASIX) 40 MG tablet Take 60 mg by mouth 3 (three) times a week. Monday, Wednesday and Friday   hydrALAZINE (APRESOLINE) 50 MG tablet Take 50 mg by mouth 3 (three) times daily.   icosapent Ethyl (VASCEPA) 1 g capsule Take 2 g by mouth 2 (two) times daily.   insulin aspart (FIASP FLEXTOUCH) 100 UNIT/ML FlexTouch Pen Inject 20 Units into the skin as needed (scale slide for elevated glucose). Inject 20 units into the skin before each meal   insulin regular human CONCENTRATED (HUMULIN R) 500 UNIT/ML injection Inject 30 Units into the skin 3 (three) times daily with meals.   isosorbide mononitrate (IMDUR) 30 MG 24 hr tablet TAKE 1 TABLET BY MOUTH EVERY DAY (Patient taking differently: Take 30 mg by mouth daily.)   NIFEdipine (ADALAT CC) 60 MG 24 hr tablet Take 60 mg by mouth 2 (two) times daily.   nitroGLYCERIN (NITROSTAT) 0.4 MG SL tablet Place 1 tablet (0.4 mg total) under the tongue every 5 (five) minutes as needed for chest pain.  polyethylene glycol (MIRALAX / GLYCOLAX) 17 g packet Take 17 g by mouth 2 (two) times daily.   Semaglutide (OZEMPIC, 0.25 OR 0.5 MG/DOSE, [place]) Inject 0.5 mg into the skin once a week.   vitamin B-12 (CYANOCOBALAMIN) 1000 MCG tablet Take 1,000 mcg by mouth every Monday.   [DISCONTINUED] cloNIDine (CATAPRES) 0.1 MG tablet TAKE 1 TABLET BY MOUTH EVERY DAY (Patient taking differently: Take 0.1 mg by mouth daily.)     Allergies:   Patient has no active allergies.   Social History   Socioeconomic History   Marital status: Married    Spouse name: Not on file   Number of children: Not on file   Years of education: Not on file   Highest education level: Not on file  Occupational History   Not on file  Tobacco Use   Smoking status: Former    Years: 1.00    Types: Cigarettes    Quit date: 71    Years since quitting: 37.9   Smokeless tobacco: Never  Substance and Sexual Activity   Alcohol use: Not on file   Drug use: Not on file   Sexual activity: Not on file   Other Topics Concern   Not on file  Social History Narrative   Not on file   Social Determinants of Health   Financial Resource Strain: Not on file  Food Insecurity: Not on file  Transportation Needs: Not on file  Physical Activity: Not on file  Stress: Not on file  Social Connections: Not on file     Family History: The patient's family history includes Alzheimer's disease in his mother; Cancer in his mother; Clotting disorder in his mother; Diabetes in his father and mother; Stroke in his mother. ROS:   Please see the history of present illness.    All 14 point review of systems negative except as described per history of present illness  EKGs/Labs/Other Studies Reviewed:      Recent Labs: 08/13/2021: Hemoglobin 11.7; Platelets 274  Recent Lipid Panel No results found for: "CHOL", "TRIG", "HDL", "CHOLHDL", "VLDL", "LDLCALC", "LDLDIRECT"  Physical Exam:    VS:  BP 120/72 (BP Location: Left Arm, Patient Position: Sitting)   Pulse 87   Ht 6' 4"  (1.93 m)   Wt (!) 336 lb 12.8 oz (152.8 kg)   SpO2 97%   BMI 41.00 kg/m     Wt Readings from Last 3 Encounters:  04/22/22 (!) 336 lb 12.8 oz (152.8 kg)  04/17/22 (!) 325 lb (147.4 kg)  09/21/21 (!) 384 lb 12.8 oz (174.5 kg)     GEN:  Well nourished, well developed in no acute distress HEENT: Normal NECK: No JVD; No carotid bruits LYMPHATICS: No lymphadenopathy CARDIAC: RRR, no murmurs, no rubs, no gallops RESPIRATORY:  Clear to auscultation without rales, wheezing or rhonchi  ABDOMEN: Soft, non-tender, non-distended MUSCULOSKELETAL:  No edema; No deformity  SKIN: Warm and dry LOWER EXTREMITIES: no swelling NEUROLOGIC:  Alert and oriented x 3 PSYCHIATRIC:  Normal affect   ASSESSMENT:    1. Chronic diastolic congestive heart failure ([place])   2. LVH (left ventricular hypertrophy)   3. Essential hypertension   4. Type 2 diabetes mellitus with stage 3b chronic kidney disease, with long-term current use of insulin ([place])    5. Hypertensive CKD, ESRD on dialysis ([place])    PLAN:    In order of problems listed above:  Chronic diastolic congestive heart failure.  Doing well from that point review look like he is compensated right  now and his fluid balance has been achieved with dialysis. Essential hypertension: Blood pressure well controlled continue present management. Dyslipidemia he is on Lipitor 10 which I will continue.  I do not have his fasting lipid profile we will call primary care physician to get report of it Type 2 diabetes.  I do have data from last year with hemoglobin A1c 10.5.  Now he tells me is much better.  When he should be better control he already got multiple complication of diabetes including chronic renal failure he is on dialysis   Medication Adjustments/Labs and Tests Ordered: Current medicines are reviewed at length with the patient today.  Concerns regarding medicines are outlined above.  No orders of the defined types were placed in this encounter.  Medication changes: No orders of the defined types were placed in this encounter.   Signed, Park Liter, MD, [facility] 04/22/2022 2:24 PM    [place]

## 2022-04-22 NOTE — Patient Instructions (Addendum)
Medication Instructions:  Your physician recommends that you continue on your current medications as directed. Please refer to the Current Medication list given to you today.  *If you need a refill on your cardiac medications before your next appointment, please call your pharmacy*   Lab Work: None Ordered If you have labs (blood work) drawn today and your tests are completely normal, you will receive your results only by: [place] (if you have MyChart) OR A paper copy in the mail If you have any lab test that is abnormal or we need to change your treatment, we will call you to review the results.   Testing/Procedures:  WHY IS MY DOCTOR PRESCRIBING ZIO? The Zio system is proven and trusted by physicians to detect and diagnose irregular heart rhythms -- and has been prescribed to hundreds of thousands of patients.  The FDA has cleared the Zio system to monitor for many different kinds of irregular heart rhythms. In a study, physicians were able to reach a diagnosis 90% of the time with the Zio system1.  You can wear the Zio monitor -- a small, discreet, comfortable patch -- during your normal day-to-day activity, including while you sleep, shower, and exercise, while it records every single heartbeat for analysis.  1Barrett, P., et al. Comparison of 24 Hour Holter Monitoring Versus 14 Day Novel Adhesive Patch Electrocardiographic Monitoring. [place], 2014.  ZIO VS. HOLTER MONITORING The Zio monitor can be comfortably worn for up to 14 days. Holter monitors can be worn for 24 to 48 hours, limiting the time to record any irregular heart rhythms you may have. Zio is able to capture data for the 51% of patients who have their first symptom-triggered arrhythmia after 48 hours.1  LIVE WITHOUT RESTRICTIONS The Zio ambulatory cardiac monitor is a small, unobtrusive, and water-resistant patch--you might even forget you're wearing it. The Zio monitor records and stores  every beat of your heart, whether you're sleeping, working out, or showering.     Follow-Up: At [facility], you and your health needs are our priority.  As part of our continuing mission to provide you with exceptional heart care, we have created designated Provider Care Teams.  These Care Teams include your primary Cardiologist (physician) and Advanced Practice Providers (APPs -  Physician Assistants and Nurse Practitioners) who all work together to provide you with the care you need, when you need it.  We recommend signing up for the patient portal called "MyChart".  Sign up information is provided on this After Visit Summary.  MyChart is used to connect with patients for Virtual Visits (Telemedicine).  Patients are able to view lab/test results, encounter notes, upcoming appointments, etc.  Non-urgent messages can be sent to your provider as well.   To learn more about what you can do with MyChart, go to [email or web address].    Your next appointment:   6 month(s)  The format for your next appointment:   In Person  Provider:   Jenne Campus, MD    Other Instructions NA

## 2022-05-13 ENCOUNTER — Telehealth: Payer: Self-pay | Admitting: Cardiology

## 2022-05-13 NOTE — Telephone Encounter (Signed)
Informed the patient that Dr. Agustin Cree had not sent [place] an interpretation of the monitor report to give to him at this time. I asked the patient if he could give [place] a couple more days for Dr. Agustin Cree to interpret the monitor. Patient was agreeable with this plan and had no further questions at this time.

## 2022-05-13 NOTE — Telephone Encounter (Signed)
Pt returning call

## 2022-05-13 NOTE — Telephone Encounter (Signed)
Pt is requesting call back in regards to monitor results. Requesting returning call.

## 2022-05-13 NOTE — Telephone Encounter (Signed)
Left message for the patient to call back.

## 2022-05-16 ENCOUNTER — Encounter (HOSPITAL_COMMUNITY): Payer: Self-pay | Admitting: Vascular Surgery

## 2022-05-16 NOTE — Progress Notes (Signed)
I spoke to Mr and Mrs. Feig. Mr. Fosdick denies chest pain or shortness of breath.  Patient denies having any s/s of Covid in her household, also denies any known exposure to Covid.    Mr. Calix has type II diabetes. I instructed patient to not take any Insulin unless CBG is  greater than 220, then he should take 1/2 dose of Insulin Aspart, Fiasp Flextouch. I instructed patient to check CBG after awaking and every 2 hours until arrival  to the hospital.  I Instructed patient if CBG is less than 70 to take 4 Glucose Tablets or 1 tube of Glucose Gel or 1/2 cup of a clear juice. Recheck CBG in 15 minutes if CBG is not over 70 call, pre- op desk at [phone] for further instructions. If scheduled to receive Insulin, do not take Insulin

## 2022-05-17 ENCOUNTER — Ambulatory Visit (HOSPITAL_COMMUNITY)
Admission: RE | Admit: 2022-05-17 | Discharge: 2022-05-17 | Disposition: A | Discharge status: Home / Self Care | Payer: 59 | Attending: Vascular Surgery | Admitting: Vascular Surgery

## 2022-05-17 ENCOUNTER — Ambulatory Visit (HOSPITAL_COMMUNITY): Payer: 59 | Admitting: Anesthesiology

## 2022-05-17 ENCOUNTER — Other Ambulatory Visit: Payer: Self-pay

## 2022-05-17 ENCOUNTER — Ambulatory Visit (HOSPITAL_BASED_OUTPATIENT_CLINIC_OR_DEPARTMENT_OTHER): Payer: 59 | Admitting: Anesthesiology

## 2022-05-17 ENCOUNTER — Encounter (HOSPITAL_COMMUNITY)
Admission: RE | Disposition: A | Discharge status: Home / Self Care | Payer: Self-pay | Source: Home / Self Care | Attending: Vascular Surgery

## 2022-05-17 ENCOUNTER — Encounter (HOSPITAL_COMMUNITY): Payer: Self-pay | Admitting: Vascular Surgery

## 2022-05-17 DIAGNOSIS — Z86718 Personal history of other venous thrombosis and embolism: Secondary | ICD-10-CM | POA: Insufficient documentation

## 2022-05-17 DIAGNOSIS — E1122 Type 2 diabetes mellitus with diabetic chronic kidney disease: Secondary | ICD-10-CM

## 2022-05-17 DIAGNOSIS — Z7985 Long-term (current) use of injectable non-insulin antidiabetic drugs: Secondary | ICD-10-CM | POA: Diagnosis not present

## 2022-05-17 DIAGNOSIS — Z992 Dependence on renal dialysis: Secondary | ICD-10-CM | POA: Diagnosis not present

## 2022-05-17 DIAGNOSIS — N186 End stage renal disease: Secondary | ICD-10-CM | POA: Insufficient documentation

## 2022-05-17 DIAGNOSIS — N185 Chronic kidney disease, stage 5: Secondary | ICD-10-CM | POA: Diagnosis not present

## 2022-05-17 DIAGNOSIS — Z87891 Personal history of nicotine dependence: Secondary | ICD-10-CM | POA: Diagnosis not present

## 2022-05-17 DIAGNOSIS — Z794 Long term (current) use of insulin: Secondary | ICD-10-CM

## 2022-05-17 DIAGNOSIS — Z86711 Personal history of pulmonary embolism: Secondary | ICD-10-CM | POA: Diagnosis not present

## 2022-05-17 DIAGNOSIS — Z833 Family history of diabetes mellitus: Secondary | ICD-10-CM | POA: Diagnosis not present

## 2022-05-17 DIAGNOSIS — I132 Hypertensive heart and chronic kidney disease with heart failure and with stage 5 chronic kidney disease, or end stage renal disease: Secondary | ICD-10-CM | POA: Insufficient documentation

## 2022-05-17 DIAGNOSIS — Z95828 Presence of other vascular implants and grafts: Secondary | ICD-10-CM | POA: Diagnosis not present

## 2022-05-17 DIAGNOSIS — I509 Heart failure, unspecified: Secondary | ICD-10-CM | POA: Diagnosis not present

## 2022-05-17 DIAGNOSIS — D631 Anemia in chronic kidney disease: Secondary | ICD-10-CM

## 2022-05-17 HISTORY — DX: Personal history of urinary calculi: Z87.442

## 2022-05-17 HISTORY — PX: AV FISTULA PLACEMENT: SHX1204

## 2022-05-17 LAB — POCT I-STAT, CHEM 8
BUN: 71 mg/dL — ABNORMAL HIGH (ref 6–20)
Calcium, Ion: 1.1 mmol/L — ABNORMAL LOW (ref 1.15–1.40)
Chloride: 98 mmol/L (ref 98–111)
Creatinine, Ser: 5.9 mg/dL — ABNORMAL HIGH (ref 0.61–1.24)
Glucose, Bld: 143 mg/dL — ABNORMAL HIGH (ref 70–99)
HCT: 27 % — ABNORMAL LOW (ref 39.0–52.0)
Hemoglobin: 9.2 g/dL — ABNORMAL LOW (ref 13.0–17.0)
Potassium: 3.7 mmol/L (ref 3.5–5.1)
Sodium: 137 mmol/L (ref 135–145)
TCO2: 27 mmol/L (ref 22–32)

## 2022-05-17 LAB — GLUCOSE, CAPILLARY
Glucose-Capillary: 141 mg/dL — ABNORMAL HIGH (ref 70–99)
Glucose-Capillary: 158 mg/dL — ABNORMAL HIGH (ref 70–99)
Glucose-Capillary: 171 mg/dL — ABNORMAL HIGH (ref 70–99)

## 2022-05-17 SURGERY — ARTERIOVENOUS (AV) FISTULA CREATION
Anesthesia: Monitor Anesthesia Care | Site: Arm Lower | Laterality: Right

## 2022-05-17 MED ORDER — HEPARIN 6000 UNIT IRRIGATION SOLUTION
Status: DC | PRN
  Administered 2022-05-17: 1

## 2022-05-17 MED ORDER — ORAL CARE MOUTH RINSE: 15.0000 mL | Freq: Once | OROMUCOSAL | Status: AC

## 2022-05-17 MED ORDER — OXYCODONE-ACETAMINOPHEN 5-325 MG PO TABS: 1.0000 | ORAL_TABLET | Freq: Four times a day (QID) | ORAL | 0 refills | Status: DC | PRN

## 2022-05-17 MED ORDER — CHLORHEXIDINE GLUCONATE 0.12 % MT SOLN
15.0000 mL | Freq: Once | OROMUCOSAL | Status: AC
  Administered 2022-05-17: 15 mL via OROMUCOSAL
  Filled 2022-05-17: qty 15

## 2022-05-17 MED ORDER — FENTANYL CITRATE (PF) 100 MCG/2ML IJ SOLN: 50.0000 ug | Freq: Once | INTRAMUSCULAR | Status: AC

## 2022-05-17 MED ORDER — CEFAZOLIN SODIUM-DEXTROSE 2-4 GM/100ML-% IV SOLN
2.0000 g | INTRAVENOUS | Status: AC
  Administered 2022-05-17: 2 g via INTRAVENOUS
  Filled 2022-05-17: qty 100

## 2022-05-17 MED ORDER — HEPARIN 6000 UNIT IRRIGATION SOLUTION
Status: AC
  Filled 2022-05-17: qty 500

## 2022-05-17 MED ORDER — HYDROMORPHONE HCL 1 MG/ML IJ SOLN: 0.2500 mg | INTRAMUSCULAR | Status: DC | PRN

## 2022-05-17 MED ORDER — CHLORHEXIDINE GLUCONATE 4 % EX LIQD: 60.0000 mL | Freq: Once | CUTANEOUS | Status: DC

## 2022-05-17 MED ORDER — 0.9 % SODIUM CHLORIDE (POUR BTL) OPTIME
TOPICAL | Status: DC | PRN
  Administered 2022-05-17: 1000 mL

## 2022-05-17 MED ORDER — MIDAZOLAM HCL 2 MG/2ML IJ SOLN
INTRAMUSCULAR | Status: AC
  Administered 2022-05-17: 2 mg via INTRAVENOUS
  Filled 2022-05-17: qty 2

## 2022-05-17 MED ORDER — MIDAZOLAM HCL 2 MG/2ML IJ SOLN: 2.0000 mg | Freq: Once | INTRAMUSCULAR | Status: AC

## 2022-05-17 MED ORDER — LIDOCAINE-EPINEPHRINE (PF) 1 %-1:200000 IJ SOLN
INTRAMUSCULAR | Status: AC
  Filled 2022-05-17: qty 30

## 2022-05-17 MED ORDER — ROPIVACAINE HCL 5 MG/ML IJ SOLN
INTRAMUSCULAR | Status: DC | PRN
  Administered 2022-05-17: 30 mL via PERINEURAL

## 2022-05-17 MED ORDER — SODIUM CHLORIDE 0.9 % IV SOLN: INTRAVENOUS | Status: DC

## 2022-05-17 MED ORDER — FENTANYL CITRATE (PF) 100 MCG/2ML IJ SOLN
INTRAMUSCULAR | Status: AC
  Administered 2022-05-17: 50 ug via INTRAVENOUS
  Filled 2022-05-17: qty 2

## 2022-05-17 MED ORDER — PROPOFOL 10 MG/ML IV BOLUS
INTRAVENOUS | Status: DC | PRN
  Administered 2022-05-17: 20 mg via INTRAVENOUS
  Administered 2022-05-17: 10 mg via INTRAVENOUS

## 2022-05-17 MED ORDER — INSULIN ASPART 100 UNIT/ML IJ SOLN
0.0000 [IU] | INTRAMUSCULAR | Status: DC | PRN
  Administered 2022-05-17: 2 [IU] via SUBCUTANEOUS
  Filled 2022-05-17: qty 1

## 2022-05-17 MED ORDER — LIDOCAINE 2% (20 MG/ML) 5 ML SYRINGE
INTRAMUSCULAR | Status: DC | PRN
  Administered 2022-05-17: 50 mg via INTRAVENOUS

## 2022-05-17 MED ORDER — PROPOFOL 500 MG/50ML IV EMUL
INTRAVENOUS | Status: DC | PRN
  Administered 2022-05-17: 75 ug/kg/min via INTRAVENOUS

## 2022-05-17 SURGICAL SUPPLY — 29 items
ARMBAND PINK RESTRICT EXTREMIT (MISCELLANEOUS) ×1 IMPLANT
BAG COUNTER SPONGE SURGICOUNT (BAG) ×1 IMPLANT
CANISTER SUCT 3000ML PPV (MISCELLANEOUS) ×1 IMPLANT
CLIP LIGATING EXTRA MED SLVR (CLIP) ×1 IMPLANT
CLIP LIGATING EXTRA SM BLUE (MISCELLANEOUS) ×1 IMPLANT
COVER PROBE W GEL 5X96 (DRAPES) IMPLANT
DERMABOND ADVANCED .7 DNX12 (GAUZE/BANDAGES/DRESSINGS) ×1 IMPLANT
ELECT REM PT RETURN 9FT ADLT (ELECTROSURGICAL) ×1
ELECTRODE REM PT RTRN 9FT ADLT (ELECTROSURGICAL) ×1 IMPLANT
GLOVE BIO SURGEON STRL SZ7.5 (GLOVE) ×1 IMPLANT
GOWN STRL REUS W/ TWL LRG LVL3 (GOWN DISPOSABLE) ×2 IMPLANT
GOWN STRL REUS W/ TWL XL LVL3 (GOWN DISPOSABLE) ×1 IMPLANT
GOWN STRL REUS W/TWL LRG LVL3 (GOWN DISPOSABLE) ×2
GOWN STRL REUS W/TWL XL LVL3 (GOWN DISPOSABLE) ×1
KIT BASIN OR (CUSTOM PROCEDURE TRAY) ×1 IMPLANT
KIT TURNOVER KIT B (KITS) ×1 IMPLANT
NS IRRIG 1000ML POUR BTL (IV SOLUTION) ×1 IMPLANT
PACK CV ACCESS (CUSTOM PROCEDURE TRAY) ×1 IMPLANT
PAD ARMBOARD 7.5X6 YLW CONV (MISCELLANEOUS) ×2 IMPLANT
POWDER SURGICEL 3.0 GRAM (HEMOSTASIS) IMPLANT
SLING ARM FOAM STRAP LRG (SOFTGOODS) IMPLANT
SLING ARM FOAM STRAP MED (SOFTGOODS) IMPLANT
SUT MNCRL AB 4-0 PS2 18 (SUTURE) ×1 IMPLANT
SUT PROLENE 6 0 BV (SUTURE) ×1 IMPLANT
SUT VIC AB 3-0 SH 27 (SUTURE) ×1
SUT VIC AB 3-0 SH 27X BRD (SUTURE) ×1 IMPLANT
TOWEL GREEN STERILE (TOWEL DISPOSABLE) ×1 IMPLANT
UNDERPAD 30X36 HEAVY ABSORB (UNDERPADS AND DIAPERS) ×1 IMPLANT
WATER STERILE IRR 1000ML POUR (IV SOLUTION) ×1 IMPLANT

## 2022-05-17 NOTE — Transfer of Care (Signed)
Immediate Anesthesia Transfer of Care Note  Patient: Sean Valentine  Procedure(s) Performed: RIGHT BRACHIOCEPHALIC ARTERIOVENOUS (AV) FISTULA CREATION (Right: Arm Lower)  Patient Location: PACU  Anesthesia Type:MAC combined with regional for post-op pain  Level of Consciousness: awake, alert , and oriented  Airway & Oxygen Therapy: Patient Spontanous Breathing  Post-op Assessment: Report given to RN and Post -op Vital signs reviewed and stable  Post vital signs: Reviewed and stable  Last Vitals:  Vitals Value Taken Time  BP    Temp    Pulse 74 05/17/22 1206  Resp 15 05/17/22 1206  SpO2 98 % 05/17/22 1206  Vitals shown include unvalidated device data.  Last Pain:  Vitals:   05/17/22 0944  TempSrc:   PainSc: 0-No pain      Patients Stated Pain Goal: 0 (57/32/20 2542)  Complications: No notable events documented.

## 2022-05-17 NOTE — H&P (Signed)
HPI:   Sean Valentine is a 56 y.o. male with end-stage renal disease currently on dialysis via catheter.  He does not have any history of upper extremity or chest or breast surgery in the past.  He is left-hand dominant.  He does have a history of an IVC filter currently anticoagulated for history of DVT and pulmonary embolism.  He walks with help of a cane.  He continues to work in [employer].  He is on dialysis Tuesdays, Thursdays and Saturdays.       Past Medical History:  Diagnosis Date   Anemia, chronic disease 05/26/2018    Formatting of this note might be different from the original. Eval with [facility] and phlebotomy for Hemochromotosis.   Anticoagulant long-term use 11/26/2016   Arthritis of right foot 05/08/2016   Atypical chest pain 09/13/2020   Bacteremia due to group B Streptococcus 11/03/2020   Benign hypertension with CKD (chronic kidney disease) stage III ([place]) 07/12/2019   Bilateral leg edema     Cellulitis 11/26/2020   Chest pain at rest 08/16/2020   CHF (congestive heart failure) ([place]) 03/05/2012   COVID-19 virus infection 02/09/2020   Deformity of toe 08/01/2020   Diabetic ulcer of toe of right foot associated with type 2 diabetes mellitus, with fat layer exposed ([place]) 01/10/2020    Formatting of this note might be different from the original. Distal tuft right fifth toe   Dyslipidemia 03/05/2012   Essential hypertension 11/26/2016   GERD (gastroesophageal reflux disease) 08/01/2020   Gouty arthritis of left hand 01/15/2017   High risk medication use 11/26/2016   History of COVID-19 05/05/2020    Formatting of this note might be different from the original. 02/2020   History of diabetic ulcer of foot 04/04/2021   History of pulmonary embolism 11/26/2016    Formatting of this note might be different from the original. Felt severe with permanent anticoagulation and Filter placed because of burden.   Hypertension     Localized edema 03/05/2012   LVH (left ventricular hypertrophy) 05/26/2018     Formatting of this note might be different from the original. 04/2018 EF 555-60%   Malaise and fatigue 11/26/2016   Medication monitoring encounter 11/15/2020   MGUS (monoclonal gammopathy of unknown significance) 06/03/2018    Formatting of this note might be different from the original. Possible. Seen on SPEP 05/2018   Mild persistent asthma 11/26/2016   Mixed hyperlipidemia 11/26/2016   Morbid (severe) obesity due to excess calories ([place]) 03/05/2012   Morbid obesity with body mass index (BMI) of 40.0 to 49.9 ([place]) 11/26/2016   Nail dystrophy 05/20/2018   Nephrolithiasis 94/12/960   Nonalcoholic steatohepatitis ([facility]) 05/26/2018   Obesity, Class III, BMI 40-49.9 (morbid obesity) ([place]) 11/26/2016   Other hemochromatosis 03/21/2020   PICC (peripherally inserted central catheter) in place 12/12/2020   Posterior tibial tendinitis of right lower extremity 08/01/2020   Pre-ulcerative calluses 06/18/2019   Primary central sleep apnea 11/26/2016    Formatting of this note might be different from the original. bipap   Sepsis ([place]) 10/30/2020   Sepsis due to cellulitis ([place]) 10/30/2020   Sleep apnea 03/05/2012    Formatting of this note might be different from the original. [place].   Spinal stenosis of lumbar region 05/26/2018   Status post amputation of toe of right foot ([place]) 07/26/2015   Subfibular impingement, right 03/13/2016   Tinea pedis 08/01/2020   Type 2 diabetes mellitus with stage 3b chronic kidney disease, with long-term current use  of insulin ([place]) 07/12/2019    Formatting of this note might be different from the original. IMO 10/01 Updates  Formatting of this note might be different from the original. 2000   Ulcer of right foot with fat layer exposed ([place]) 12/05/2020         Family History  Problem Relation Age of Onset   Cancer Mother     Clotting disorder Mother     Stroke Mother     Alzheimer's disease Mother     Diabetes Mother     Diabetes Father           Past Surgical History:   Procedure Laterality Date   BUBBLE STUDY   11/03/2020    Procedure: BUBBLE STUDY;  Surgeon: Geralynn Rile, MD;  Location: [facility];  Service: Cardiovascular;;   CHOLECYSTECTOMY       CYSTOSCOPY W/ URETERAL STENT PLACEMENT Left 05/06/2005   FOOT SURGERY Right      Big toe amputation   IVC FILTER INSERTION   08/2016   KIDNEY STONE SURGERY       SHOULDER SURGERY Left     TEE WITHOUT CARDIOVERSION N/A 11/03/2020    Procedure: TRANSESOPHAGEAL ECHOCARDIOGRAM (TEE);  Surgeon: Geralynn Rile, MD;  Location: [facility];  Service: Cardiovascular;  Laterality: N/A;      Short Social History:  Social History         Tobacco Use   Smoking status: Former      Years: 1.00      Types: Cigarettes      Quit date: 1986      Years since quitting: 37.8   Smokeless tobacco: Never  Substance Use Topics   Alcohol use: Not on file          Allergies  Allergen Reactions   Amlodipine Swelling            Current Outpatient Medications  Medication Sig Dispense Refill   carvedilol (COREG) 25 MG tablet Take 1 tablet (25 mg total) by mouth 2 (two) times daily with a meal. 90 tablet 1   cloNIDine (CATAPRES) 0.1 MG tablet TAKE 1 TABLET BY MOUTH EVERY DAY (Patient not taking: Reported on 04/17/2022) 90 tablet 1   empagliflozin (JARDIANCE) 25 MG TABS tablet Take 25 mg by mouth daily. (Patient not taking: Reported on 04/17/2022)       ergocalciferol (VITAMIN D2) 1.25 MG (50000 UT) capsule Take 1 capsule by mouth every Monday.       hydrALAZINE (APRESOLINE) 50 MG tablet Take 50 mg by mouth 3 (three) times daily.       insulin aspart (FIASP FLEXTOUCH) 100 UNIT/ML FlexTouch Pen Inject 20 Units into the skin 3 (three) times daily. Inject 20 units into the skin before each meal       insulin regular human CONCENTRATED (HUMULIN R) 500 UNIT/ML injection Inject 30 Units into the skin 3 (three) times daily with meals.       isosorbide mononitrate (IMDUR) 30 MG 24 hr tablet TAKE 1 TABLET BY MOUTH  EVERY DAY 90 tablet 2   nitroGLYCERIN (NITROSTAT) 0.4 MG SL tablet Place 1 tablet (0.4 mg total) under the tongue every 5 (five) minutes as needed for chest pain. 25 tablet 11   vitamin B-12 (CYANOCOBALAMIN) 1000 MCG tablet Take 1,000 mcg by mouth every Monday.        No current facility-administered medications for this visit.      Review of Systems  Constitutional:  Constitutional negative. HENT: HENT negative.  Eyes: Eyes negative.  Respiratory: Respiratory negative.  Cardiovascular: Cardiovascular negative.  GI: Gastrointestinal negative.  Musculoskeletal: Musculoskeletal negative.  Skin: Skin negative.  Neurological: Neurological negative. Hematologic: Hematologic/lymphatic negative.  Psychiatric: Psychiatric negative.          Objective:    Vitals:   05/17/22 0843  BP: 132/68  Pulse: 75  Resp: 18  Temp: 98 F (36.7 C)  SpO2: 98%      Physical Exam HENT:     Head: Normocephalic.  Eyes:     Pupils: Pupils are equal, round, and reactive to light.  Cardiovascular:     Pulses:          Radial pulses are 2+ on the right side and 2+ on the left side.  Abdominal:     General: Abdomen is flat.     Palpations: Abdomen is soft.  Musculoskeletal:        General: Normal range of motion.     Right lower leg: No edema.     Left lower leg: No edema.  Skin:    General: Skin is warm.     Capillary Refill: Capillary refill takes less than 2 seconds.  Neurological:     General: No focal deficit present.     Mental Status: He is alert.  Psychiatric:        Mood and Affect: Mood normal.        Thought Content: Thought content normal.        Judgment: Judgment normal.        Data: Right Cephalic   Diameter (cm)Depth (cm)Findings   +-----------------+-------------+----------+---------+  Shoulder            0.48        2.39              +-----------------+-------------+----------+---------+  Prox upper arm       0.40        1.65               +-----------------+-------------+----------+---------+  Mid upper arm        0.44        0.73              +-----------------+-------------+----------+---------+  Dist upper arm       0.56        0.72              +-----------------+-------------+----------+---------+  Antecubital fossa    0.62        0.56              +-----------------+-------------+----------+---------+  Prox forearm         0.32        0.94   branching  +-----------------+-------------+----------+---------+  Mid forearm          0.32        0.51              +-----------------+-------------+----------+---------+  Wrist               0.27        0.63              +-----------------+-------------+----------+---------+   +-----------------+-------------+----------+--------+  Left Cephalic    Diameter (cm)Depth (cm)Findings  +-----------------+-------------+----------+--------+  Shoulder            0.43        2.10             +-----------------+-------------+----------+--------+  Prox upper arm       0.43  1.79             +-----------------+-------------+----------+--------+  Mid upper arm        0.47        0.95             +-----------------+-------------+----------+--------+  Dist upper arm       0.53        0.78             +-----------------+-------------+----------+--------+  Antecubital fossa    0.44        0.48             +-----------------+-------------+----------+--------+  Prox forearm         0.29        0.34             +-----------------+-------------+----------+--------+  Mid forearm          0.21        0.33             +-----------------+-------------+----------+--------+  Wrist               0.16        0.24             +-----------------+-------------+----------+--------+         Assessment/Plan:    56 year old male with end-stage renal disease currently dialyzing via catheter.  He is left-hand dominant.   Plan will be for right arm AV fistula versus graft today in the OR.  I have discussed the risk benefits alternatives and expected recovery.       Waynetta Sandy MD [facility]

## 2022-05-17 NOTE — Anesthesia Procedure Notes (Signed)
Procedure Name: MAC Date/Time: 05/17/2022 11:02 AM  Performed by: Mariea Clonts, CRNAPre-anesthesia Checklist: Patient identified, Emergency Drugs available, Suction available, Patient being monitored and Timeout performed Patient Re-evaluated:Patient Re-evaluated prior to induction Oxygen Delivery Method: Simple face mask

## 2022-05-17 NOTE — Anesthesia Procedure Notes (Signed)
Anesthesia Regional Block: Supraclavicular block   Pre-Anesthetic Checklist: , timeout performed,  Correct Patient, Correct Site, Correct Laterality,  Correct Procedure, Correct Position, site marked,  Risks and benefits discussed,  Pre-op evaluation,  At surgeon's request and post-op pain management  Laterality: Right  Prep: Maximum Sterile Barrier Precautions used, chloraprep       Needles:  Injection technique: Single-shot  Needle Type: Echogenic Stimulator Needle     Needle Length: 9cm  Needle Gauge: 21     Additional Needles:   Procedures:,,,, ultrasound used (permanent image in chart),,    Narrative:  Start time: 05/17/2022 9:28 AM End time: 05/17/2022 9:38 AM Injection made incrementally with aspirations every 5 mL.  Performed by: Personally  Anesthesiologist: Roderic Palau, MD

## 2022-05-17 NOTE — Op Note (Signed)
    Patient name: Evelyn Moch MRN: [id number] DOB: 1966/04/29 Sex: male  05/17/2022 Pre-operative Diagnosis: esrd Post-operative diagnosis:  Same Surgeon:  Erlene Quan C. Donzetta Matters, MD. Assistant: Vicente Serene  Procedure Performed:  Right brachial artery to cephalic vein AV fistula creation   Indications: 56 year old male left-hand-dominant with end-stage renal disease on dialysis via catheter.  He is now indicated for permanent dialysis access with suitable cephalic vein bilaterally and we are planning for right arm fistula creation.  Findings: The cephalic vein measured approximately 4 mm at the antecubital and the brachial artery also approximate 4 mm and after AV fistula creation there is a very strong thrill in the fistula can be traced with Doppler of the arm and a palpable radial artery pulse in the wrist also confirmed with Doppler.   Procedure:  The patient was identified in the holding area and taken to the operating was placed upon operative when MAC anesthesia was induced.  Preoperative block been placed.  He was then prepped draped in the right upper extremity usual fashion, antibiotics were minister timeout was called.  Ultrasound used to identify a suitable cephalic vein and brachial artery just below the antecubital.  The block was checked noted to be intact.  Transverse incision was created.  The vein was dissected out more for orientation branches divided between clips and ties.  We dissected through the fascia the brachial artery and placed Vesseloops around this.  The vein was then clamped off distally transected and tied off spatulated flushed with heparinized saline and a clamp was placed.  The artery was clamped distally proximally longitudinally flushed distally with heparinized saline given the large size of.  The vein was spatulated sewn into side with 6-0 Prolene suture.  Prior completion without flushing or retrograde function was a very strong thrill within the fistula confirmed  with Doppler and a palpable radial artery pulse at the wrist also confirmed with Doppler.  The wound was irrigated and hemostasis was obtained and we closed in layers with Vicryl and Monocryl.  Patient was awakened from anesthesia having tolerated procedure without any complication.  Counts were correct at completion.  An experienced assistant was necessary to facilitate exposure of the vein and the artery as well as to proceed with anastomosis and assist with wound closure.  EBL: 25 cc     Shabre Kreher C. Donzetta Matters, MD [facility] Office: [phone] Pager: [phone]

## 2022-05-17 NOTE — Discharge Instructions (Signed)
[facility]  Discharge Instructions  AV Fistula or Graft Surgery for Dialysis Access  Please refer to the following instructions for your post-procedure care. Your surgeon or physician assistant will discuss any changes with you.  Activity  You may drive the day following your surgery, if you are comfortable and no longer taking prescription pain medication. Resume full activity as the soreness in your incision resolves.  Bathing/Showering  You may shower after you go home. Keep your incision dry for 48 hours. Do not soak in a bathtub, hot tub, or swim until the incision heals completely. You may not shower if you have a hemodialysis catheter.  Incision Care  Clean your incision with mild soap and water after 48 hours. Pat the area dry with a clean towel. You do not need a bandage unless otherwise instructed. Do not apply any ointments or creams to your incision. You may have skin glue on your incision. Do not peel it off. It will come off on its own in about one week. Your arm may swell a bit after surgery. To reduce swelling use pillows to elevate your arm so it is above your heart. Your doctor will tell you if you need to lightly wrap your arm with an ACE bandage.  Diet  Resume your normal diet. There are not special food restrictions following this procedure. In order to heal from your surgery, it is CRITICAL to get adequate nutrition. Your body requires vitamins, minerals, and protein. Vegetables are the best source of vitamins and minerals. Vegetables also provide the perfect balance of protein. Processed food has little nutritional value, so try to avoid this.  Medications  Resume taking all of your medications. If your incision is causing pain, you may take over-the counter pain relievers such as acetaminophen (Tylenol). If you were prescribed a stronger pain medication, please be aware these medications can cause nausea and constipation. Prevent  nausea by taking the medication with a snack or meal. Avoid constipation by drinking plenty of fluids and eating foods with high amount of fiber, such as fruits, vegetables, and grains.  Do not take Tylenol if you are taking prescription pain medications.  Follow up Your surgeon may want to see you in the office following your access surgery. If so, this will be arranged at the time of your surgery.  Please call us immediately for any of the following conditions:  Increased pain, redness, drainage (pus) from your incision site Fever of 101 degrees or higher Severe or worsening pain at your incision site Hand pain or numbness.  Reduce your risk of vascular disease:  Stop smoking. If you would like help, call [facility] at 1-800-QUIT-NOW 248 637 2455) or [place] at [place] your cholesterol Maintain a desired weight Control your diabetes Keep your blood pressure down  Dialysis  It will take several weeks to several months for your new dialysis access to be ready for use. Your surgeon will determine when it is okay to use it. Your nephrologist will continue to direct your dialysis. You can continue to use your Permcath until your new access is ready for use.   05/17/2022 Sean Valentine [id number] Nov 30, 1965  Surgeon(s): Waynetta Sandy, MD  Procedure(s): RIGHT BRACHIOCEPHALIC ARTERIOVENOUS (AV) FISTULA CREATION   May stick graft immediately   May stick graft on designated area only:   x Do not stick fistula for 12 weeks    If you have any questions, please call the office at [phone].

## 2022-05-17 NOTE — Anesthesia Preprocedure Evaluation (Addendum)
Anesthesia Evaluation  Patient identified by MRN, date of birth, ID band Patient awake    Reviewed: Allergy & Precautions, H&P , NPO status , Patient's Chart, lab work & pertinent test results, reviewed documented beta blocker date and time   Airway Mallampati: II  TM Distance: >3 FB Neck ROM: Full    Dental no notable dental hx. (+) Poor Dentition, Dental Advisory Given   Pulmonary asthma , sleep apnea and Continuous Positive Airway Pressure Ventilation , former smoker   Pulmonary exam normal breath sounds clear to auscultation       Cardiovascular hypertension, Pt. on medications and Pt. on home beta blockers +CHF   Rhythm:Regular Rate:Normal     Neuro/Psych negative neurological ROS  negative psych ROS   GI/Hepatic Neg liver ROS,GERD  ,,  Endo/Other  diabetes, Type 1, Insulin Dependent    Renal/GU ESRF and DialysisRenal disease  negative genitourinary   Musculoskeletal  (+) Arthritis ,    Abdominal   Peds  Hematology  (+) Blood dyscrasia, anemia   Anesthesia Other Findings   Reproductive/Obstetrics negative OB ROS                             Anesthesia Physical Anesthesia Plan  ASA: 3  Anesthesia Plan: MAC and Regional   Post-op Pain Management: Ofirmev IV (intra-op)*   Induction: Intravenous  PONV Risk Score and Plan: 2 and Ondansetron, Midazolam and Propofol infusion  Airway Management Planned: Natural Airway and Simple Face Mask  Additional Equipment:   Intra-op Plan:   Post-operative Plan:   Informed Consent: I have reviewed the patients History and Physical, chart, labs and discussed the procedure including the risks, benefits and alternatives for the proposed anesthesia with the patient or authorized representative who has indicated his/her understanding and acceptance.     Dental advisory given  Plan Discussed with: CRNA  Anesthesia Plan Comments:         Anesthesia Quick Evaluation

## 2022-05-17 NOTE — Anesthesia Postprocedure Evaluation (Signed)
Anesthesia Post Note  Patient: Sender Rueb  Procedure(s) Performed: RIGHT BRACHIOCEPHALIC ARTERIOVENOUS (AV) FISTULA CREATION (Right: Arm Lower)     Patient location during evaluation: PACU Anesthesia Type: Regional and MAC Level of consciousness: awake and alert Pain management: pain level controlled Vital Signs Assessment: post-procedure vital signs reviewed and stable Respiratory status: spontaneous breathing, nonlabored ventilation and respiratory function stable Cardiovascular status: stable and blood pressure returned to baseline Postop Assessment: no apparent nausea or vomiting Anesthetic complications: no  No notable events documented.  Last Vitals:  Vitals:   05/17/22 1215 05/17/22 1230  BP: 123/67 115/63  Pulse: 75 76  Resp: 16 14  Temp:  36.4 C  SpO2: 98% 98%    Last Pain:  Vitals:   05/17/22 1230  TempSrc:   PainSc: 0-No pain                 Aijalon Demuro,W. EDMOND

## 2022-05-18 ENCOUNTER — Encounter (HOSPITAL_COMMUNITY): Payer: Self-pay | Admitting: Vascular Surgery

## 2022-06-11 DIAGNOSIS — D509 Iron deficiency anemia, unspecified: Secondary | ICD-10-CM | POA: Insufficient documentation

## 2022-06-14 NOTE — Telephone Encounter (Signed)
Please result monitor as it was completed in 12/23. Advised pt once note was complete we would call with findings.

## 2022-06-14 NOTE — Telephone Encounter (Signed)
Patient wife, Amy, came by office this morning requesting monitor results. Requesting someone to call Mr. Wieand with results- [phone]. Please advise.

## 2022-06-18 ENCOUNTER — Telehealth: Payer: Self-pay

## 2022-06-18 ENCOUNTER — Other Ambulatory Visit: Payer: Self-pay | Admitting: *Deleted

## 2022-06-18 DIAGNOSIS — N186 End stage renal disease: Secondary | ICD-10-CM

## 2022-06-18 NOTE — Telephone Encounter (Signed)
Results reviewed with pt as per Dr. Wendy Poet note.  Pt verbalized understanding and had no additional questions. Called pt back with explanation of AV block.  Routed to PCP

## 2022-06-26 ENCOUNTER — Encounter: Payer: Self-pay | Admitting: Oncology

## 2022-07-02 ENCOUNTER — Encounter: Payer: Self-pay | Admitting: Oncology

## 2022-07-03 ENCOUNTER — Ambulatory Visit (HOSPITAL_COMMUNITY)
Admission: RE | Admit: 2022-07-03 | Discharge: 2022-07-03 | Disposition: A | Discharge status: Home / Self Care | Payer: BC Managed Care – PPO | Source: Ambulatory Visit | Attending: Vascular Surgery | Admitting: Vascular Surgery

## 2022-07-03 ENCOUNTER — Ambulatory Visit (INDEPENDENT_AMBULATORY_CARE_PROVIDER_SITE_OTHER): Payer: BC Managed Care – PPO | Admitting: Physician Assistant

## 2022-07-03 VITALS — BP 147/82 | HR 93 | Temp 98.0°F | Ht 77.0 in | Wt 336.0 lb

## 2022-07-03 DIAGNOSIS — N186 End stage renal disease: Secondary | ICD-10-CM

## 2022-07-03 NOTE — Progress Notes (Signed)
POST OPERATIVE OFFICE NOTE    CC:  F/u for surgery  HPI:  This is a 57 y.o. male who is s/p right BC AVF on 05/17/2022 by Dr. Cain.    He is left hand dominant.  He has a history of an IVC filter currently anticoagulated for history of DVT and pulmonary embolism.   Pt states he does  have pain/numbness in the right hand.   He states he gets some numbness in the right hand on dialysis but that is all.   The pt is on dialysis T/T/S at [place] location.   No Known Allergies  Current Outpatient Medications  Medication Sig Dispense Refill   acetaminophen (TYLENOL) 500 MG tablet Take 500 mg by mouth every 6 (six) hours as needed for moderate pain or mild pain.     apixaban (ELIQUIS) 5 MG TABS tablet Take 5 mg by mouth 2 (two) times daily.     atorvastatin (LIPITOR) 10 MG tablet Take 10 mg by mouth at bedtime.     carvedilol (COREG) 25 MG tablet Take 1 tablet (25 mg total) by mouth 2 (two) times daily with a meal. 90 tablet 1   ergocalciferol (VITAMIN D2) 1.25 MG (50000 UT) capsule Take 1 capsule by mouth 2 (two) times a week. Monday and Thursday     furosemide (LASIX) 20 MG tablet Take 60 mg by mouth every Monday, Wednesday, and Friday.     hydrALAZINE (APRESOLINE) 100 MG tablet Take 50 mg by mouth 3 (three) times daily as needed (If blood pressure is greater 150).     icosapent Ethyl (VASCEPA) 1 g capsule Take 1 g by mouth 2 (two) times daily.     insulin aspart (FIASP FLEXTOUCH) 100 UNIT/ML FlexTouch Pen Inject 20-25 Units into the skin daily as needed (scale slide for elevated glucose).     insulin regular human CONCENTRATED (HUMULIN R) 500 UNIT/ML injection Inject 30 Units into the skin 3 (three) times daily with meals.     isosorbide mononitrate (IMDUR) 30 MG 24 hr tablet TAKE 1 TABLET BY MOUTH EVERY DAY (Patient taking differently: Take 30 mg by mouth daily.) 90 tablet 2   NIFEdipine (ADALAT CC) 60 MG 24 hr tablet Take 60 mg by mouth 2 (two) times daily.     nitroGLYCERIN  (NITROSTAT) 0.4 MG SL tablet Place 1 tablet (0.4 mg total) under the tongue every 5 (five) minutes as needed for chest pain. 25 tablet 11   oxyCODONE-acetaminophen (PERCOCET) 5-325 MG tablet Take 1 tablet by mouth every 6 (six) hours as needed for severe pain. 15 tablet 0   polyethylene glycol (MIRALAX / GLYCOLAX) 17 g packet Take 17 g by mouth daily as needed for mild constipation or moderate constipation.     sucroferric oxyhydroxide (VELPHORO) 500 MG chewable tablet 500 mg 3 (three) times daily with meals.     vitamin B-12 (CYANOCOBALAMIN) 1000 MCG tablet Take 1,500 mcg by mouth once a week.     No current facility-administered medications for this visit.     ROS:  See HPI  Physical Exam:  Today's Vitals   07/03/22 1348  BP: (!) 147/82  Pulse: 93  Temp: 98 F (36.7 C)  TempSrc: Temporal  SpO2: 98%  Weight: (!) 336 lb (152.4 kg)  Height: 6' 5" (1.956 m)   Body mass index is 39.84 kg/m.   Incision:  healed nicely Extremities:   There is not a palpable right radial pulse.  Left radial pulse is palpable Motor and sensory   are in tact.   There is a thrill/bruit present.  Fistula is not easily palpable but does have a thrill.    Dialysis Duplex on 07/03/2022: Findings:  +--------------------+----------+-----------------+--------+  AVF                PSV (cm/s)Flow Vol (mL/min)Comments  +--------------------+----------+-----------------+--------+  Native artery inflow   177           639                 +--------------------+----------+-----------------+--------+  AVF Anastomosis         36                               +--------------------+----------+-----------------+--------+     +------------+----------+-------------+----------+--------+  OUTFLOW VEINPSV (cm/s)Diameter (cm)Depth (cm)Describe  +------------+----------+-------------+----------+--------+  Shoulder      104        0.60        1.20              +------------+----------+-------------+----------+--------+  Prox UA        120        0.61        0.89             +------------+----------+-------------+----------+--------+  Mid UA          81        0.57        0.67             +------------+----------+-------------+----------+--------+  Dist UA         70        0.67        0.52             +------------+----------+-------------+----------+--------+  AC Fossa       438        0.49        0.72   stenotic  +------------+----------+-------------+----------+--------+   Summary:  Arteriovenous fistula-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    Assessment/Plan:  This is a 57 y.o. male who is s/p: right BC AVF on 05/17/2022 by Dr. Cain.  -the pt does not have evidence of steal. -his fistula appears to be maturing nicely, however, he does have a stenotic area in the outflow vein at the antecubital fossa.  His fistula is also difficult to palpate throughout the upper arm.  Discussed with pt that we would begin with fistulgram to evaluate fistula.  Pending those results, he may need superficialization of his fistula.   This will be scheduled in the next 2-3 weeks.  -if pt has tunneled catheter, this can be removed at the discretion of the dialysis center once the pt's access has been successfully cannulated to their satisfaction.  -discussed with pt that access does not last forever and will need intervention or even new access at some point.  -pt is on Eliquis   Jonael Paradiso, PAC [facility] [phone]  Clinic MD:  Cain 

## 2022-07-03 NOTE — H&P (View-Only) (Signed)
POST OPERATIVE OFFICE NOTE    CC:  F/u for surgery  HPI:  This is a 57 y.o. male who is s/p right BC AVF on 05/17/2022 by Dr. Donzetta Matters.    He is left hand dominant.  He has a history of an IVC filter currently anticoagulated for history of DVT and pulmonary embolism.   Pt states he does  have pain/numbness in the right hand.   He states he gets some numbness in the right hand on dialysis but that is all.   The pt is on dialysis T/T/S at [facility] location.   No Known Allergies  Current Outpatient Medications  Medication Sig Dispense Refill   acetaminophen (TYLENOL) 500 MG tablet Take 500 mg by mouth every 6 (six) hours as needed for moderate pain or mild pain.     apixaban (ELIQUIS) 5 MG TABS tablet Take 5 mg by mouth 2 (two) times daily.     atorvastatin (LIPITOR) 10 MG tablet Take 10 mg by mouth at bedtime.     carvedilol (COREG) 25 MG tablet Take 1 tablet (25 mg total) by mouth 2 (two) times daily with a meal. 90 tablet 1   ergocalciferol (VITAMIN D2) 1.25 MG (50000 UT) capsule Take 1 capsule by mouth 2 (two) times a week. Monday and Thursday     furosemide (LASIX) 20 MG tablet Take 60 mg by mouth every Monday, Wednesday, and Friday.     hydrALAZINE (APRESOLINE) 100 MG tablet Take 50 mg by mouth 3 (three) times daily as needed (If blood pressure is greater 150).     icosapent Ethyl (VASCEPA) 1 g capsule Take 1 g by mouth 2 (two) times daily.     insulin aspart (FIASP FLEXTOUCH) 100 UNIT/ML FlexTouch Pen Inject 20-25 Units into the skin daily as needed (scale slide for elevated glucose).     insulin regular human CONCENTRATED (HUMULIN R) 500 UNIT/ML injection Inject 30 Units into the skin 3 (three) times daily with meals.     isosorbide mononitrate (IMDUR) 30 MG 24 hr tablet TAKE 1 TABLET BY MOUTH EVERY DAY (Patient taking differently: Take 30 mg by mouth daily.) 90 tablet 2   NIFEdipine (ADALAT CC) 60 MG 24 hr tablet Take 60 mg by mouth 2 (two) times daily.     nitroGLYCERIN  (NITROSTAT) 0.4 MG SL tablet Place 1 tablet (0.4 mg total) under the tongue every 5 (five) minutes as needed for chest pain. 25 tablet 11   oxyCODONE-acetaminophen (PERCOCET) 5-325 MG tablet Take 1 tablet by mouth every 6 (six) hours as needed for severe pain. 15 tablet 0   polyethylene glycol (MIRALAX / GLYCOLAX) 17 g packet Take 17 g by mouth daily as needed for mild constipation or moderate constipation.     sucroferric oxyhydroxide (VELPHORO) 500 MG chewable tablet 500 mg 3 (three) times daily with meals.     vitamin B-12 (CYANOCOBALAMIN) 1000 MCG tablet Take 1,500 mcg by mouth once a week.     No current facility-administered medications for this visit.     ROS:  See HPI  Physical Exam:  Today's Vitals   07/03/22 1348  BP: (!) 147/82  Pulse: 93  Temp: 98 F (36.7 C)  TempSrc: Temporal  SpO2: 98%  Weight: (!) 336 lb (152.4 kg)  Height: 6' 5"$  (1.956 m)   Body mass index is 39.84 kg/m.   Incision:  healed nicely Extremities:   There is not a palpable right radial pulse.  Left radial pulse is palpable Motor and sensory  are in tact.   There is a thrill/bruit present.  Fistula is not easily palpable but does have a thrill.    Dialysis Duplex on 07/03/2022: Findings:  +--------------------+----------+-----------------+--------+  AVF                PSV (cm/s)Flow Vol (mL/min)Comments  +--------------------+----------+-----------------+--------+  Native artery inflow   177           639                 +--------------------+----------+-----------------+--------+  AVF Anastomosis         36                               +--------------------+----------+-----------------+--------+     +------------+----------+-------------+----------+--------+  OUTFLOW VEINPSV (cm/s)Diameter (cm)Depth (cm)Describe  +------------+----------+-------------+----------+--------+  Shoulder      104        0.60        1.20              +------------+----------+-------------+----------+--------+  Prox UA        120        0.61        0.89             +------------+----------+-------------+----------+--------+  Mid UA          81        0.57        0.67             +------------+----------+-------------+----------+--------+  Dist UA         70        0.67        0.52             +------------+----------+-------------+----------+--------+  AC Fossa       438        0.49        0.72   stenotic  +------------+----------+-------------+----------+--------+   Summary:  Arteriovenous fistula-Stenosis noted in the outflow vein at the [facility] fossa, just  distal to the anastomosis.    Assessment/Plan:  This is a 57 y.o. male who is s/p: right BC AVF on 05/17/2022 by Dr. Donzetta Matters.  -the pt does not have evidence of steal. -his fistula appears to be maturing nicely, however, he does have a stenotic area in the outflow vein at the antecubital fossa.  His fistula is also difficult to palpate throughout the upper arm.  Discussed with pt that we would begin with fistulgram to evaluate fistula.  Pending those results, he may need superficialization of his fistula.   This will be scheduled in the next 2-3 weeks.  -if pt has tunneled catheter, this can be removed at the discretion of the dialysis center once the pt's access has been successfully cannulated to their satisfaction.  -discussed with pt that access does not last forever and will need intervention or even new access at some point.  -pt is on Eliquis   Leontine Locket, [facility] [phone]  Clinic MD:  Donzetta Matters

## 2022-07-09 ENCOUNTER — Other Ambulatory Visit: Payer: Self-pay

## 2022-07-09 DIAGNOSIS — N186 End stage renal disease: Secondary | ICD-10-CM

## 2022-07-09 MED ORDER — SODIUM CHLORIDE 0.9% FLUSH: 3.0000 mL | Freq: Two times a day (BID) | INTRAVENOUS | Status: DC

## 2022-07-09 MED ORDER — SODIUM CHLORIDE 0.9 % IV SOLN: 250.0000 mL | INTRAVENOUS | Status: DC | PRN

## 2022-07-22 ENCOUNTER — Encounter (HOSPITAL_COMMUNITY)
Admission: RE | Disposition: A | Discharge status: Home / Self Care | Payer: Self-pay | Source: Home / Self Care | Attending: Vascular Surgery

## 2022-07-22 ENCOUNTER — Other Ambulatory Visit: Payer: Self-pay

## 2022-07-22 ENCOUNTER — Ambulatory Visit (HOSPITAL_COMMUNITY)
Admission: RE | Admit: 2022-07-22 | Discharge: 2022-07-22 | Disposition: A | Discharge status: Home / Self Care | Payer: BC Managed Care – PPO | Attending: Vascular Surgery | Admitting: Vascular Surgery

## 2022-07-22 DIAGNOSIS — T82858A Stenosis of vascular prosthetic devices, implants and grafts, initial encounter: Secondary | ICD-10-CM | POA: Insufficient documentation

## 2022-07-22 DIAGNOSIS — N186 End stage renal disease: Secondary | ICD-10-CM | POA: Diagnosis not present

## 2022-07-22 DIAGNOSIS — T82898A Other specified complication of vascular prosthetic devices, implants and grafts, initial encounter: Secondary | ICD-10-CM | POA: Diagnosis not present

## 2022-07-22 DIAGNOSIS — Z7901 Long term (current) use of anticoagulants: Secondary | ICD-10-CM | POA: Diagnosis not present

## 2022-07-22 DIAGNOSIS — Y832 Surgical operation with anastomosis, bypass or graft as the cause of abnormal reaction of the patient, or of later complication, without mention of misadventure at the time of the procedure: Secondary | ICD-10-CM | POA: Insufficient documentation

## 2022-07-22 DIAGNOSIS — Z86711 Personal history of pulmonary embolism: Secondary | ICD-10-CM | POA: Diagnosis not present

## 2022-07-22 DIAGNOSIS — Z992 Dependence on renal dialysis: Secondary | ICD-10-CM | POA: Insufficient documentation

## 2022-07-22 DIAGNOSIS — Z86718 Personal history of other venous thrombosis and embolism: Secondary | ICD-10-CM | POA: Insufficient documentation

## 2022-07-22 DIAGNOSIS — N185 Chronic kidney disease, stage 5: Secondary | ICD-10-CM | POA: Diagnosis not present

## 2022-07-22 HISTORY — PX: A/V FISTULAGRAM: CATH118298

## 2022-07-22 LAB — POCT I-STAT, CHEM 8
BUN: 106 mg/dL — ABNORMAL HIGH (ref 6–20)
Calcium, Ion: 1.21 mmol/L (ref 1.15–1.40)
Chloride: 97 mmol/L — ABNORMAL LOW (ref 98–111)
Creatinine, Ser: 5.6 mg/dL — ABNORMAL HIGH (ref 0.61–1.24)
Glucose, Bld: 185 mg/dL — ABNORMAL HIGH (ref 70–99)
HCT: 33 % — ABNORMAL LOW (ref 39.0–52.0)
Hemoglobin: 11.2 g/dL — ABNORMAL LOW (ref 13.0–17.0)
Potassium: 3.9 mmol/L (ref 3.5–5.1)
Sodium: 137 mmol/L (ref 135–145)
TCO2: 26 mmol/L (ref 22–32)

## 2022-07-22 SURGERY — A/V FISTULAGRAM
Anesthesia: LOCAL | Laterality: Right

## 2022-07-22 MED ORDER — MIDAZOLAM HCL 2 MG/2ML IJ SOLN
INTRAMUSCULAR | Status: DC | PRN
  Administered 2022-07-22: 1 mg via INTRAVENOUS

## 2022-07-22 MED ORDER — HEPARIN (PORCINE) IN NACL 1000-0.9 UT/500ML-% IV SOLN
INTRAVENOUS | Status: DC | PRN
  Administered 2022-07-22: 500 mL

## 2022-07-22 MED ORDER — SODIUM CHLORIDE 0.9% FLUSH: 3.0000 mL | INTRAVENOUS | Status: DC | PRN

## 2022-07-22 MED ORDER — LIDOCAINE HCL (PF) 1 % IJ SOLN
INTRAMUSCULAR | Status: AC
  Filled 2022-07-22: qty 30

## 2022-07-22 MED ORDER — MIDAZOLAM HCL 2 MG/2ML IJ SOLN
INTRAMUSCULAR | Status: AC
  Filled 2022-07-22: qty 2

## 2022-07-22 MED ORDER — IODIXANOL 320 MG/ML IV SOLN
INTRAVENOUS | Status: DC | PRN
  Administered 2022-07-22: 35 mL

## 2022-07-22 MED ORDER — LIDOCAINE HCL (PF) 1 % IJ SOLN
INTRAMUSCULAR | Status: DC | PRN
  Administered 2022-07-22: 5 mL

## 2022-07-22 MED ORDER — FENTANYL CITRATE (PF) 100 MCG/2ML IJ SOLN
INTRAMUSCULAR | Status: DC | PRN
  Administered 2022-07-22: 50 ug via INTRAVENOUS

## 2022-07-22 MED ORDER — FENTANYL CITRATE (PF) 100 MCG/2ML IJ SOLN
INTRAMUSCULAR | Status: AC
  Filled 2022-07-22: qty 2

## 2022-07-22 SURGICAL SUPPLY — 9 items
BAG SNAP BAND KOVER 36X36 (MISCELLANEOUS) ×1 IMPLANT
COVER DOME SNAP 22 D (MISCELLANEOUS) ×1 IMPLANT
KIT MICROPUNCTURE NIT STIFF (SHEATH) IMPLANT
PROTECTION STATION PRESSURIZED (MISCELLANEOUS) ×1
SHEATH PROBE COVER 6X72 (BAG) ×1 IMPLANT
STATION PROTECTION PRESSURIZED (MISCELLANEOUS) ×1 IMPLANT
STOPCOCK MORSE 400PSI 3WAY (MISCELLANEOUS) ×1 IMPLANT
TRAY PV CATH (CUSTOM PROCEDURE TRAY) ×1 IMPLANT
TUBING CIL FLEX 10 FLL-RA (TUBING) ×1 IMPLANT

## 2022-07-22 NOTE — Op Note (Signed)
    Patient name: Sean Valentine MRN: [id number] DOB: [birth year] Sex: male  07/22/2022 Pre-operative Diagnosis: End-stage renal disease, malfunction right arm AV fistula Post-operative diagnosis:  Same Surgeon:  Eda Paschal. Donzetta Matters, MD Procedure Performed: 1.  Ultrasound-guided right arm AV fistula 2.  Right upper extremity fistulogram 3.  Moderate sedation with fentanyl and Versed for 8 minutes   Indications: 57 year old male 2 months out from right brachial artery to cephalic vein AV fistula creation which has an area of stenosis recognized on duplex and is too deep for cannulation.  Plan will be for fistulogram today and consideration of superficialization in the OR in the near future.  Findings: Fistula in the upper arm appears mature with at least half centimeter diameter throughout and no flow-limiting stenosis areas.  The anastomosis to the brachial artery is patent.  There are 2 large branches near the antecubitum.  Plan will be for superficialization ligation of side branches on a nondialysis day in the near future.  Eliquis will need to be held for 48 hours prior to the procedure.   Procedure:  The patient was identified in the holding area and taken to room 7.  The patient was then placed supine on the table and prepped and draped in the usual sterile fashion.  A time out was called.  Ultrasound was used to evaluate the right arm AV fistula.  The area was anesthetized with 1% lidocaine and cannulated with direct ultrasound visualization with a micropuncture needle followed by wire and the sheath.  An ultrasound image was saved to the permanent record.  Concomitantly we administered fentanyl and Versed for moderate sedation for a total of 8 minutes and his vital signs were monitored by bedside nursing throughout the case.  Right upper extremity fistulogram was performed including retrograde images with pressure held.  With the above findings we will plan for superficialization with branch  ligation on a nondialysis day in the near future.  The sheath was removed and cannulation site was ligated with 4-0 Monocryl suture.  Patient tolerated procedure without immediate complication.  Contrast: 35 cc   Zigmond Trela C. Donzetta Matters, MD [facility] [place] Office: [phone] Pager: [phone]

## 2022-07-22 NOTE — Interval H&P Note (Signed)
History and Physical Interval Note:  07/22/2022 11:59 AM  Sean Valentine  has presented today for surgery, with the diagnosis of instage renal.  The various methods of treatment have been discussed with the patient and family. After consideration of risks, benefits and other options for treatment, the patient has consented to  Procedure(s): A/V Fistulagram (Right) as a surgical intervention.  The patient's history has been reviewed, patient examined, no change in status, stable for surgery.  I have reviewed the patient's chart and labs.  Questions were answered to the patient's satisfaction.     [name]

## 2022-07-22 NOTE — Progress Notes (Signed)
Patient and wife was given discharge instructions. Both verbalized understanding. 

## 2022-07-23 ENCOUNTER — Encounter (HOSPITAL_COMMUNITY): Payer: Self-pay | Admitting: Vascular Surgery

## 2022-07-23 ENCOUNTER — Other Ambulatory Visit: Payer: Self-pay

## 2022-07-23 DIAGNOSIS — T82590A Other mechanical complication of surgically created arteriovenous fistula, initial encounter: Secondary | ICD-10-CM

## 2022-07-23 DIAGNOSIS — N186 End stage renal disease: Secondary | ICD-10-CM

## 2022-07-31 ENCOUNTER — Other Ambulatory Visit: Payer: Self-pay

## 2022-07-31 ENCOUNTER — Encounter (HOSPITAL_COMMUNITY): Payer: Self-pay | Admitting: Vascular Surgery

## 2022-07-31 NOTE — Progress Notes (Signed)
PCP - Gilford Rile, MD Cardiologist - Jenne Campus, MD  PPM/ICD - denies  EKG - 05/17/22 Stress Test - 09/20/20 ECHO - 10/02/21  CPAP - yes; 13  Fasting Blood Sugar - 91 - 200 Checks Blood Sugar - patient has CGM  Blood Thinner Instructions: Eliquis - last dose - 07/27/22 per patient Patient was instructed: As of today, STOP taking any Aspirin (unless otherwise instructed by your surgeon) Aleve, Naproxen, Ibuprofen, Motrin, Advil, Goody's, BC's, all herbal medications, fish oil, and all vitamins.  ERAS Protcol - n/a  COVID TEST- n/a  Anesthesia review: yes  Patient verbally denies any shortness of breath, fever, cough and chest pain during phone call   -------------  SDW INSTRUCTIONS given:  Your procedure is scheduled on Friday, March 1st, 2024.  Report to [facility] Main Entrance "A" at 06:45 A.M., and check in at the Admitting office.  Call this number if you have problems the morning of surgery:  [phone]   Remember:  Do not eat or drink after midnight the night before your surgery    Take these medicines the morning of surgery with A SIP OF WATER: Coreg, Nifedipine PRN: Tylenol, Nitroglycerin  Hold insulin regular human CONCENTRATED (HUMULIN R)  the day of surgery Hold insulin aspart (FIASP FLEXTOUCH)    If your CBG is greater than 220 mg/dL, you may take  of your sliding scale (correction) dose of insulin.  How do I manage my blood sugar before surgery? Check your blood sugar at least 4 times a day, starting 2 days before surgery, to make sure that the level is not too high or low.  Check your blood sugar the morning of your surgery when you wake up and every 2 hours until you get to the Short Stay unit.  If your blood sugar is less than 70 mg/dL, you will need to treat for low blood sugar: Do not take insulin. Treat a low blood sugar (less than 70 mg/dL) with  cup of clear juice (cranberry or apple), 4 glucose tablets, OR glucose gel. Recheck  blood sugar in 15 minutes after treatment (to make sure it is greater than 70 mg/dL). If your blood sugar is not greater than 70 mg/dL on recheck, call [phone] for further instructions. Report your blood sugar to the short stay nurse when you get to Short Stay.   The day of surgery:                     Do not wear jewelry,             Do not wear lotions, powders, colognes, or deodorant.            Men may shave face and neck.            Do not bring valuables to the hospital.            [facility] is not responsible for any belongings or valuables.  Do NOT Smoke (Tobacco/Vaping) 24 hours prior to your procedure If you use a CPAP at night, you may bring all equipment for your overnight stay.   Contacts, glasses, dentures or bridgework may not be worn into surgery.      For patients admitted to the hospital, discharge time will be determined by your treatment team.   Patients discharged the day of surgery will not be allowed to drive home, and someone needs to stay with them for 24 hours.    Special instructions:  [facility]- Preparing For Surgery  Before surgery, you can play an important role. Because skin is not sterile, your skin needs to be as free of germs as possible. You can reduce the number of germs on your skin by washing with CHG (chlorahexidine gluconate) Soap before surgery.  CHG is an antiseptic cleaner which kills germs and bonds with the skin to continue killing germs even after washing.    Oral Hygiene is also important to reduce your risk of infection.  Remember - BRUSH YOUR TEETH THE MORNING OF SURGERY WITH YOUR REGULAR TOOTHPASTE  Please do not use if you have an allergy to CHG or antibacterial soaps. If your skin becomes reddened/irritated stop using the CHG.  Do not shave (including legs and underarms) for at least 48 hours prior to first CHG shower. It is OK to shave your face.  Please follow these instructions carefully.   Shower the NIGHT BEFORE  SURGERY and the MORNING OF SURGERY with DIAL Soap.   Pat yourself dry with a CLEAN TOWEL.  Wear CLEAN PAJAMAS to bed the night before surgery  Place CLEAN SHEETS on your bed the night of your first shower and DO NOT SLEEP WITH PETS.   Day of Surgery: Please shower morning of surgery  Wear Clean/Comfortable clothing the morning of surgery Do not apply any deodorants/lotions.   Remember to brush your teeth WITH YOUR REGULAR TOOTHPASTE.   Questions were answered. Patient verbalized understanding of instructions.

## 2022-08-01 ENCOUNTER — Telehealth: Payer: Self-pay

## 2022-08-01 NOTE — Telephone Encounter (Signed)
Spoke with patient's wife regarding arrival time change to 0745 AM on tomorrow for surgery at [facility]. She verbalized understanding.

## 2022-08-01 NOTE — Progress Notes (Signed)
Pt updated on surgery time change [phone], arrival 0815.

## 2022-08-02 ENCOUNTER — Ambulatory Visit (HOSPITAL_COMMUNITY): Payer: BC Managed Care – PPO | Admitting: Anesthesiology

## 2022-08-02 ENCOUNTER — Encounter (HOSPITAL_COMMUNITY)
Admission: RE | Disposition: A | Discharge status: Home / Self Care | Payer: Self-pay | Source: Home / Self Care | Attending: Vascular Surgery

## 2022-08-02 ENCOUNTER — Ambulatory Visit (HOSPITAL_COMMUNITY)
Admission: RE | Admit: 2022-08-02 | Discharge: 2022-08-02 | Disposition: A | Discharge status: Home / Self Care | Payer: BC Managed Care – PPO | Attending: Vascular Surgery | Admitting: Vascular Surgery

## 2022-08-02 ENCOUNTER — Encounter (HOSPITAL_COMMUNITY): Payer: Self-pay | Admitting: Vascular Surgery

## 2022-08-02 ENCOUNTER — Other Ambulatory Visit: Payer: Self-pay

## 2022-08-02 DIAGNOSIS — N186 End stage renal disease: Secondary | ICD-10-CM | POA: Insufficient documentation

## 2022-08-02 DIAGNOSIS — Z7901 Long term (current) use of anticoagulants: Secondary | ICD-10-CM | POA: Insufficient documentation

## 2022-08-02 DIAGNOSIS — T82510A Breakdown (mechanical) of surgically created arteriovenous fistula, initial encounter: Secondary | ICD-10-CM | POA: Insufficient documentation

## 2022-08-02 DIAGNOSIS — Z794 Long term (current) use of insulin: Secondary | ICD-10-CM | POA: Insufficient documentation

## 2022-08-02 DIAGNOSIS — Z09 Encounter for follow-up examination after completed treatment for conditions other than malignant neoplasm: Secondary | ICD-10-CM | POA: Insufficient documentation

## 2022-08-02 DIAGNOSIS — Z86718 Personal history of other venous thrombosis and embolism: Secondary | ICD-10-CM | POA: Insufficient documentation

## 2022-08-02 DIAGNOSIS — Z86711 Personal history of pulmonary embolism: Secondary | ICD-10-CM | POA: Insufficient documentation

## 2022-08-02 DIAGNOSIS — T82858A Stenosis of vascular prosthetic devices, implants and grafts, initial encounter: Secondary | ICD-10-CM | POA: Insufficient documentation

## 2022-08-02 DIAGNOSIS — E1122 Type 2 diabetes mellitus with diabetic chronic kidney disease: Secondary | ICD-10-CM | POA: Insufficient documentation

## 2022-08-02 DIAGNOSIS — T82590A Other mechanical complication of surgically created arteriovenous fistula, initial encounter: Secondary | ICD-10-CM

## 2022-08-02 DIAGNOSIS — G4730 Sleep apnea, unspecified: Secondary | ICD-10-CM | POA: Diagnosis not present

## 2022-08-02 DIAGNOSIS — T82898A Other specified complication of vascular prosthetic devices, implants and grafts, initial encounter: Secondary | ICD-10-CM | POA: Diagnosis not present

## 2022-08-02 DIAGNOSIS — I509 Heart failure, unspecified: Secondary | ICD-10-CM | POA: Insufficient documentation

## 2022-08-02 DIAGNOSIS — N185 Chronic kidney disease, stage 5: Secondary | ICD-10-CM | POA: Diagnosis not present

## 2022-08-02 DIAGNOSIS — I132 Hypertensive heart and chronic kidney disease with heart failure and with stage 5 chronic kidney disease, or end stage renal disease: Secondary | ICD-10-CM | POA: Insufficient documentation

## 2022-08-02 DIAGNOSIS — Z992 Dependence on renal dialysis: Secondary | ICD-10-CM | POA: Insufficient documentation

## 2022-08-02 HISTORY — PX: REVISON OF ARTERIOVENOUS FISTULA: SHX6074

## 2022-08-02 LAB — GLUCOSE, CAPILLARY
Glucose-Capillary: 74 mg/dL (ref 70–99)
Glucose-Capillary: 70 mg/dL (ref 70–99)

## 2022-08-02 LAB — POCT I-STAT, CHEM 8
BUN: 63 mg/dL — ABNORMAL HIGH (ref 6–20)
Calcium, Ion: 1.2 mmol/L (ref 1.15–1.40)
Chloride: 98 mmol/L (ref 98–111)
Creatinine, Ser: 5.7 mg/dL — ABNORMAL HIGH (ref 0.61–1.24)
Glucose, Bld: 81 mg/dL (ref 70–99)
HCT: 34 % — ABNORMAL LOW (ref 39.0–52.0)
Hemoglobin: 11.6 g/dL — ABNORMAL LOW (ref 13.0–17.0)
Potassium: 3.7 mmol/L (ref 3.5–5.1)
Sodium: 138 mmol/L (ref 135–145)
TCO2: 28 mmol/L (ref 22–32)

## 2022-08-02 SURGERY — REVISON OF ARTERIOVENOUS FISTULA
Anesthesia: Monitor Anesthesia Care | Site: Arm Upper | Laterality: Right

## 2022-08-02 MED ORDER — LIDOCAINE-EPINEPHRINE (PF) 1 %-1:200000 IJ SOLN
INTRAMUSCULAR | Status: AC
  Filled 2022-08-02: qty 30

## 2022-08-02 MED ORDER — OXYCODONE-ACETAMINOPHEN 5-325 MG PO TABS: 1.0000 | ORAL_TABLET | Freq: Four times a day (QID) | ORAL | 0 refills | Status: DC | PRN

## 2022-08-02 MED ORDER — INSULIN ASPART 100 UNIT/ML IJ SOLN: 0.0000 [IU] | INTRAMUSCULAR | Status: DC | PRN

## 2022-08-02 MED ORDER — PHENYLEPHRINE HCL-NACL 20-0.9 MG/250ML-% IV SOLN
INTRAVENOUS | Status: DC | PRN
  Administered 2022-08-02: 25 ug/min via INTRAVENOUS

## 2022-08-02 MED ORDER — SODIUM CHLORIDE 0.9 % IV SOLN: INTRAVENOUS | Status: DC

## 2022-08-02 MED ORDER — HEPARIN 6000 UNIT IRRIGATION SOLUTION
Status: DC | PRN
  Administered 2022-08-02: 1

## 2022-08-02 MED ORDER — HEPARIN 6000 UNIT IRRIGATION SOLUTION
Status: AC
  Filled 2022-08-02: qty 500

## 2022-08-02 MED ORDER — FENTANYL CITRATE (PF) 250 MCG/5ML IJ SOLN
INTRAMUSCULAR | Status: AC
  Filled 2022-08-02: qty 5

## 2022-08-02 MED ORDER — 0.9 % SODIUM CHLORIDE (POUR BTL) OPTIME
TOPICAL | Status: DC | PRN
  Administered 2022-08-02: 1000 mL

## 2022-08-02 MED ORDER — CHLORHEXIDINE GLUCONATE 0.12 % MT SOLN
15.0000 mL | Freq: Once | OROMUCOSAL | Status: AC
  Administered 2022-08-02: 15 mL via OROMUCOSAL
  Filled 2022-08-02: qty 15

## 2022-08-02 MED ORDER — LIDOCAINE HCL 1 % IJ SOLN
INTRAMUSCULAR | Status: AC
  Filled 2022-08-02: qty 20

## 2022-08-02 MED ORDER — CHLORHEXIDINE GLUCONATE 4 % EX LIQD: 60.0000 mL | Freq: Once | CUTANEOUS | Status: DC

## 2022-08-02 MED ORDER — ORAL CARE MOUTH RINSE: 15.0000 mL | Freq: Once | OROMUCOSAL | Status: AC

## 2022-08-02 MED ORDER — ACETAMINOPHEN 10 MG/ML IV SOLN: 1000.0000 mg | Freq: Once | INTRAVENOUS | Status: DC | PRN

## 2022-08-02 MED ORDER — FENTANYL CITRATE (PF) 100 MCG/2ML IJ SOLN
25.0000 ug | INTRAMUSCULAR | Status: DC | PRN
  Administered 2022-08-02: 25 ug via INTRAVENOUS

## 2022-08-02 MED ORDER — FENTANYL CITRATE (PF) 100 MCG/2ML IJ SOLN
INTRAMUSCULAR | Status: AC
  Filled 2022-08-02: qty 2

## 2022-08-02 MED ORDER — PROPOFOL 500 MG/50ML IV EMUL
INTRAVENOUS | Status: DC | PRN
  Administered 2022-08-02: 100 ug/kg/min via INTRAVENOUS

## 2022-08-02 MED ORDER — MIDAZOLAM HCL 2 MG/2ML IJ SOLN
INTRAMUSCULAR | Status: AC
  Administered 2022-08-02: 1 mg
  Filled 2022-08-02: qty 2

## 2022-08-02 MED ORDER — ONDANSETRON HCL 4 MG/2ML IJ SOLN
INTRAMUSCULAR | Status: DC | PRN
  Administered 2022-08-02: 4 mg via INTRAVENOUS

## 2022-08-02 MED ORDER — CEFAZOLIN IN SODIUM CHLORIDE 3-0.9 GM/100ML-% IV SOLN
3.0000 g | INTRAVENOUS | Status: AC
  Administered 2022-08-02: 3 g via INTRAVENOUS
  Filled 2022-08-02: qty 100

## 2022-08-02 MED ORDER — LIDOCAINE-EPINEPHRINE (PF) 1.5 %-1:200000 IJ SOLN
INTRAMUSCULAR | Status: DC | PRN
  Administered 2022-08-02: 30 mL via PERINEURAL

## 2022-08-02 MED ORDER — FENTANYL CITRATE (PF) 100 MCG/2ML IJ SOLN
INTRAMUSCULAR | Status: AC
  Administered 2022-08-02: 50 ug
  Filled 2022-08-02: qty 2

## 2022-08-02 SURGICAL SUPPLY — 34 items
ADH SKN CLS APL DERMABOND .7 (GAUZE/BANDAGES/DRESSINGS) ×1
ARMBAND PINK RESTRICT EXTREMIT (MISCELLANEOUS) ×1 IMPLANT
BAG COUNTER SPONGE SURGICOUNT (BAG) ×1 IMPLANT
BAG SPNG CNTER NS LX DISP (BAG) ×1
CANISTER SUCT 3000ML PPV (MISCELLANEOUS) ×1 IMPLANT
CLIP LIGATING EXTRA MED SLVR (CLIP) ×1 IMPLANT
CLIP LIGATING EXTRA SM BLUE (MISCELLANEOUS) ×1 IMPLANT
COVER PROBE W GEL 5X96 (DRAPES) ×1 IMPLANT
DERMABOND ADVANCED .7 DNX12 (GAUZE/BANDAGES/DRESSINGS) ×1 IMPLANT
ELECT REM PT RETURN 9FT ADLT (ELECTROSURGICAL) ×1
ELECTRODE REM PT RTRN 9FT ADLT (ELECTROSURGICAL) ×1 IMPLANT
GLOVE BIO SURGEON STRL SZ7.5 (GLOVE) ×1 IMPLANT
GOWN STRL REUS W/ TWL LRG LVL3 (GOWN DISPOSABLE) ×2 IMPLANT
GOWN STRL REUS W/ TWL XL LVL3 (GOWN DISPOSABLE) ×1 IMPLANT
GOWN STRL REUS W/TWL LRG LVL3 (GOWN DISPOSABLE) ×2
GOWN STRL REUS W/TWL XL LVL3 (GOWN DISPOSABLE) ×1
KIT BASIN OR (CUSTOM PROCEDURE TRAY) ×1 IMPLANT
KIT TURNOVER KIT B (KITS) ×1 IMPLANT
NS IRRIG 1000ML POUR BTL (IV SOLUTION) ×1 IMPLANT
PACK CV ACCESS (CUSTOM PROCEDURE TRAY) ×1 IMPLANT
PAD ARMBOARD 7.5X6 YLW CONV (MISCELLANEOUS) ×2 IMPLANT
POWDER SURGICEL 3.0 GRAM (HEMOSTASIS) IMPLANT
SLING ARM FOAM STRAP LRG (SOFTGOODS) IMPLANT
SLING ARM FOAM STRAP MED (SOFTGOODS) IMPLANT
SUT MNCRL AB 4-0 PS2 18 (SUTURE) ×1 IMPLANT
SUT PROLENE 6 0 BV (SUTURE) ×1 IMPLANT
SUT SILK 2 0 SH (SUTURE) IMPLANT
SUT VIC AB 2-0 CT1 27 (SUTURE) ×1
SUT VIC AB 2-0 CT1 TAPERPNT 27 (SUTURE) IMPLANT
SUT VIC AB 3-0 SH 27 (SUTURE) ×2
SUT VIC AB 3-0 SH 27X BRD (SUTURE) ×1 IMPLANT
TOWEL GREEN STERILE (TOWEL DISPOSABLE) ×1 IMPLANT
UNDERPAD 30X36 HEAVY ABSORB (UNDERPADS AND DIAPERS) ×1 IMPLANT
WATER STERILE IRR 1000ML POUR (IV SOLUTION) ×1 IMPLANT

## 2022-08-02 NOTE — Transfer of Care (Signed)
Immediate Anesthesia Transfer of Care Note  Patient: Caven Kneisley  Procedure(s) Performed: REVISION OF RIGHT ARM FISTULA WITH TRANSPOSITION AND BRANCH LIGATION (Right: Arm Upper)  Patient Location: PACU  Anesthesia Type:MAC and Regional  Level of Consciousness: awake, alert , and oriented  Airway & Oxygen Therapy: Patient Spontanous Breathing  Post-op Assessment: Report given to RN and Post -op Vital signs reviewed and stable  Post vital signs: Reviewed and stable  Last Vitals:  Vitals Value Taken Time  BP 144/51 08/02/22 1207  Temp    Pulse 79 08/02/22 1210  Resp    SpO2 95 % 08/02/22 1210  Vitals shown include unvalidated device data.  Last Pain:  Vitals:   08/02/22 0824  TempSrc: Oral  PainSc: 0-No pain         Complications: No notable events documented.

## 2022-08-02 NOTE — Op Note (Signed)
    Patient name: Sean Valentine MRN: [id number] DOB: 26-Oct-1965 Sex: male  08/02/2022 Pre-operative Diagnosis: End-stage renal disease, right arm AV fistula malfunction Post-operative diagnosis:  Same Surgeon:  Eda Paschal. Donzetta Matters, MD Assistant: Leontine Locket, PA Procedure Performed:  Revision of right arm cephalic vein AV fistula with transposition  Indications: 57 year old male with history of end-stage renal disease currently on dialysis via catheter.  He has a right arm cephalic vein fistula which has been slow to mature and recently he underwent fistulogram which demonstrated multiple side branches and the fistula was too deep for cannulation.  He is now indicated for revision with possible branch ligation and superficialization versus transposition.  Findings: The patient was very large throughout its course with strong flow.  There were multiple side branches which were all ligated.  I elected to transpose the fistula laterally given the redundancy within the cephalic vein and at completion there was a very strong thrill and a palpable radial artery pulse at the wrist.   Procedure:  The patient was identified in the holding area and taken to the operating room where his placed supine operative table and MAC anesthesia was induced.  He was sterilely prepped and draped in the right upper extremity in the usual fashion, antibiotics were administered and a timeout was called.  Ultrasound was used to identify the fistula throughout the upper arm and this was marked on the skin.  Preoperative block had been placed and this was checked and noted to be intact.  2 vertical incisions were then created along using line of the fistula we then dissected out the entirety of the fistula through these and 2 incisions dividing branches between clips and ties.  The fistula was then marked for orientation clamped near the anastomosis and transected.  I tunneled it laterally maintaining orientation and then flushed with  heparinized saline and reclamped.  The 2 ends were spatulated and sewn end to end with 6-0 Prolene suture.  At completion there was a very strong thrill in the fistula and a palpable radial artery pulse at the wrist.  We obtained hemostasis in both wounds and thoroughly irrigated the wounds.  More cephalad wound we closed tissue underneath the fascia with 2-0 Vicryl suture and the skin was closed over the fistula with 4-0 Monocryl.  Towards the antecubitum we closed the incision with 3-0 Vicryl and 4-0 Monocryl.  Dermabond was placed to both incisions.  Patient was then awakened from anesthesia having tolerated procedure without immediate complication.  An experienced assistant was necessary to facilitate exposure of the vein as well as to maintain orientation during tunneling and assist with anastomosis.  EBL: 50 cc    Andora Krull C. Donzetta Matters, MD [facility] [place] Office: [phone] Pager: [phone]

## 2022-08-02 NOTE — Anesthesia Postprocedure Evaluation (Signed)
Anesthesia Post Note  Patient: Sean Valentine  Procedure(s) Performed: REVISION OF RIGHT ARM FISTULA WITH TRANSPOSITION AND BRANCH LIGATION (Right: Arm Upper)     Anesthesia Type: Regional and MAC Anesthetic complications: no   No notable events documented.  Last Vitals:  Vitals:   08/02/22 1225 08/02/22 1240  BP: (!) 152/56 (!) 139/53  Pulse: 73 73  Resp: 15 15  Temp:  36.7 C  SpO2: 94% 95%    Last Pain:  Vitals:   08/02/22 1240  TempSrc:   PainSc: 2                  March Rummage Ranen Doolin

## 2022-08-02 NOTE — H&P (Signed)
HPI:  This is a 57 y.o. male who is s/p right BC AVF on 05/17/2022 by Dr. Donzetta Matters.     He is left hand dominant.   He has a history of an IVC filter currently anticoagulated for history of DVT and pulmonary embolism.    Pt states he does  have pain/numbness in the right hand.   He states he gets some numbness in the right hand on dialysis but that is all.    The pt is on dialysis T/T/S at [facility] location.     No Known Allergies         Current Outpatient Medications  Medication Sig Dispense Refill   acetaminophen (TYLENOL) 500 MG tablet Take 500 mg by mouth every 6 (six) hours as needed for moderate pain or mild pain.       apixaban (ELIQUIS) 5 MG TABS tablet Take 5 mg by mouth 2 (two) times daily.       atorvastatin (LIPITOR) 10 MG tablet Take 10 mg by mouth at bedtime.       carvedilol (COREG) 25 MG tablet Take 1 tablet (25 mg total) by mouth 2 (two) times daily with a meal. 90 tablet 1   ergocalciferol (VITAMIN D2) 1.25 MG (50000 UT) capsule Take 1 capsule by mouth 2 (two) times a week. Monday and Thursday       furosemide (LASIX) 20 MG tablet Take 60 mg by mouth every Monday, Wednesday, and Friday.       hydrALAZINE (APRESOLINE) 100 MG tablet Take 50 mg by mouth 3 (three) times daily as needed (If blood pressure is greater 150).       icosapent Ethyl (VASCEPA) 1 g capsule Take 1 g by mouth 2 (two) times daily.       insulin aspart (FIASP FLEXTOUCH) 100 UNIT/ML FlexTouch Pen Inject 20-25 Units into the skin daily as needed (scale slide for elevated glucose).       insulin regular human CONCENTRATED (HUMULIN R) 500 UNIT/ML injection Inject 30 Units into the skin 3 (three) times daily with meals.       isosorbide mononitrate (IMDUR) 30 MG 24 hr tablet TAKE 1 TABLET BY MOUTH EVERY DAY (Patient taking differently: Take 30 mg by mouth daily.) 90 tablet 2   NIFEdipine (ADALAT CC) 60 MG 24 hr tablet Take 60 mg by mouth 2 (two) times daily.       nitroGLYCERIN (NITROSTAT) 0.4 MG SL tablet  Place 1 tablet (0.4 mg total) under the tongue every 5 (five) minutes as needed for chest pain. 25 tablet 11   oxyCODONE-acetaminophen (PERCOCET) 5-325 MG tablet Take 1 tablet by mouth every 6 (six) hours as needed for severe pain. 15 tablet 0   polyethylene glycol (MIRALAX / GLYCOLAX) 17 g packet Take 17 g by mouth daily as needed for mild constipation or moderate constipation.       sucroferric oxyhydroxide (VELPHORO) 500 MG chewable tablet 500 mg 3 (three) times daily with meals.       vitamin B-12 (CYANOCOBALAMIN) 1000 MCG tablet Take 1,500 mcg by mouth once a week.        No current facility-administered medications for this visit.       ROS:  See HPI   Physical Exam:   Vitals:   08/02/22 0824 08/02/22 0825  BP:  (!) 98/54  Pulse: 80   Resp: 13   Temp: 98.5 F (36.9 C)   SpO2: 98%         Incision:  healed nicely Extremities:   There is not a palpable right radial pulse.  Left radial pulse is palpable Motor and sensory are in tact.   There is a thrill/bruit present.  Fistula is not easily palpable but does have a thrill.      Dialysis Duplex on 07/03/2022: Findings:  +--------------------+----------+-----------------+--------+  AVF                PSV (cm/s)Flow Vol (mL/min)Comments  +--------------------+----------+-----------------+--------+  Native artery inflow   177           639                 +--------------------+----------+-----------------+--------+  AVF Anastomosis         36                               +--------------------+----------+-----------------+--------+     +------------+----------+-------------+----------+--------+  OUTFLOW VEINPSV (cm/s)Diameter (cm)Depth (cm)Describe  +------------+----------+-------------+----------+--------+  Shoulder      104        0.60        1.20             +------------+----------+-------------+----------+--------+  Prox UA        120        0.61        0.89              +------------+----------+-------------+----------+--------+  Mid UA          81        0.57        0.67             +------------+----------+-------------+----------+--------+  Dist UA         70        0.67        0.52             +------------+----------+-------------+----------+--------+  AC Fossa       438        0.49        0.72   stenotic  +------------+----------+-------------+----------+--------+   Summary:  Arteriovenous fistula-Stenosis noted in the outflow vein at the [facility] fossa, just  distal to the anastomosis.      Assessment/Plan:  This is a 57 y.o. male who is s/p: right BC AVF with multiple side branches visualized too deep for cannulation.  Plan is for revision with likely transposition versus superficialization of branch ligation today in the OR.  We have again discussed risk benefits and alternatives and he demonstrates good understanding of the plan.  Hughes Wyndham C. Donzetta Matters, MD [facility] [place] Office: [phone] Pager: [phone]

## 2022-08-02 NOTE — Discharge Instructions (Signed)
   [facility]  Discharge Instructions  AV Fistula or Graft Surgery for Dialysis Access  Please refer to the following instructions for your post-procedure care. Your surgeon or physician assistant will discuss any changes with you.  Activity  You may drive the day following your surgery, if you are comfortable and no longer taking prescription pain medication. Resume full activity as the soreness in your incision resolves.  Bathing/Showering  You may shower after you go home. Keep your incision dry for 48 hours. Do not soak in a bathtub, hot tub, or swim until the incision heals completely. You may not shower if you have a hemodialysis catheter.  Incision Care  Clean your incision with mild soap and water after 48 hours. Pat the area dry with a clean towel. You do not need a bandage unless otherwise instructed. Do not apply any ointments or creams to your incision. You may have skin glue on your incision. Do not peel it off. It will come off on its own in about one week. Your arm may swell a bit after surgery. To reduce swelling use pillows to elevate your arm so it is above your heart. Your doctor will tell you if you need to lightly wrap your arm with an ACE bandage.  Diet  Resume your normal diet. There are not special food restrictions following this procedure. In order to heal from your surgery, it is CRITICAL to get adequate nutrition. Your body requires vitamins, minerals, and protein. Vegetables are the best source of vitamins and minerals. Vegetables also provide the perfect balance of protein. Processed food has little nutritional value, so try to avoid this.  Medications  Resume taking all of your medications. If your incision is causing pain, you may take over-the counter pain relievers such as acetaminophen (Tylenol). If you were prescribed a stronger pain medication, please be aware these medications can cause nausea and constipation. Prevent  nausea by taking the medication with a snack or meal. Avoid constipation by drinking plenty of fluids and eating foods with high amount of fiber, such as fruits, vegetables, and grains.  Do not take Tylenol if you are taking prescription pain medications.  Follow up Your surgeon may want to see you in the office following your access surgery. If so, this will be arranged at the time of your surgery.  Please call us immediately for any of the following conditions:  Increased pain, redness, drainage (pus) from your incision site Fever of 101 degrees or higher Severe or worsening pain at your incision site Hand pain or numbness.  Reduce your risk of vascular disease:  Stop smoking. If you would like help, call [facility] at 1-800-QUIT-NOW (514) 704-7508) or [place] at [place] your cholesterol Maintain a desired weight Control your diabetes Keep your blood pressure down  Dialysis  It will take several weeks to several months for your new dialysis access to be ready for use. Your surgeon will determine when it is okay to use it. Your nephrologist will continue to direct your dialysis. You can continue to use your Permcath until your new access is ready for use.   08/02/2022 Sean Valentine [id number] 1965/09/19  Surgeon(s): Waynetta Sandy, MD  Procedure(s): REVISION OF RIGHT ARM FISTULA WITH TRANSPOSITION AND BRANCH LIGATION  x Do not stick fistula for 3 weeks    If you have any questions, please call the office at [phone].

## 2022-08-02 NOTE — Anesthesia Preprocedure Evaluation (Signed)
Anesthesia Evaluation  Patient identified by MRN, date of birth, ID band Patient awake    Reviewed: Allergy & Precautions, NPO status , Patient's Chart, lab work & pertinent test results  Airway Mallampati: II  TM Distance: >3 FB Neck ROM: Full    Dental no notable dental hx.    Pulmonary asthma , sleep apnea and Continuous Positive Airway Pressure Ventilation , former smoker   Pulmonary exam normal        Cardiovascular hypertension, Pt. on medications and Pt. on home beta blockers +CHF   Rhythm:Regular Rate:Normal     Neuro/Psych negative neurological ROS  negative psych ROS   GI/Hepatic ,GERD  ,,(+) Hepatitis -  Endo/Other  diabetes, Type 2, Insulin Dependent    Renal/GU ESRF and DialysisRenal disease  negative genitourinary   Musculoskeletal  (+) Arthritis , Osteoarthritis,    Abdominal Normal abdominal exam  (+)   Peds  Hematology  (+) Blood dyscrasia, anemia   Anesthesia Other Findings   Reproductive/Obstetrics                             Anesthesia Physical Anesthesia Plan  ASA: 3  Anesthesia Plan: MAC and Regional   Post-op Pain Management: Regional block*   Induction: Intravenous  PONV Risk Score and Plan: 1 and Propofol infusion, Ondansetron, Dexamethasone, Midazolam and Treatment may vary due to age or medical condition  Airway Management Planned: Simple Face Mask and Nasal Cannula  Additional Equipment: None  Intra-op Plan:   Post-operative Plan:   Informed Consent: I have reviewed the patients History and Physical, chart, labs and discussed the procedure including the risks, benefits and alternatives for the proposed anesthesia with the patient or authorized representative who has indicated his/her understanding and acceptance.     Dental advisory given  Plan Discussed with:   Anesthesia Plan Comments:        Anesthesia Quick Evaluation

## 2022-08-02 NOTE — Anesthesia Procedure Notes (Signed)
Anesthesia Regional Block: Supraclavicular block   Pre-Anesthetic Checklist: , timeout performed,  Correct Patient, Correct Site, Correct Laterality,  Correct Procedure, Correct Position, site marked,  Risks and benefits discussed,  Surgical consent,  Pre-op evaluation,  At surgeon's request and post-op pain management  Laterality: Right  Prep: Dura Prep       Needles:  Injection technique: Single-shot  Needle Type: Echogenic Stimulator Needle     Needle Length: 5cm  Needle Gauge: 20     Additional Needles:   Procedures:,,,, ultrasound used (permanent image in chart),,    Narrative:  Start time: 08/02/2022 9:58 AM End time: 08/02/2022 10:00 AM Injection made incrementally with aspirations every 5 mL.  Performed by: Personally  Anesthesiologist: Darral Dash, DO  Additional Notes: Patient identified. Risks/Benefits/Options discussed with patient including but not limited to bleeding, infection, nerve damage, failed block, incomplete pain control. Patient expressed understanding and wished to proceed. All questions were answered. Sterile technique was used throughout the entire procedure. Please see nursing notes for vital signs. Aspirated in 5cc intervals with injection for negative confirmation. Patient was given instructions on fall risk and not to get out of bed. All questions and concerns addressed with instructions to call with any issues or inadequate analgesia.

## 2022-08-03 ENCOUNTER — Encounter (HOSPITAL_COMMUNITY): Payer: Self-pay | Admitting: Vascular Surgery

## 2022-09-17 ENCOUNTER — Other Ambulatory Visit: Payer: Self-pay | Admitting: *Deleted

## 2022-09-17 DIAGNOSIS — N186 End stage renal disease: Secondary | ICD-10-CM

## 2022-09-25 ENCOUNTER — Ambulatory Visit (HOSPITAL_COMMUNITY)
Admission: RE | Admit: 2022-09-25 | Discharge: 2022-09-25 | Disposition: A | Discharge status: Home / Self Care | Payer: BC Managed Care – PPO | Source: Ambulatory Visit | Attending: Vascular Surgery | Admitting: Vascular Surgery

## 2022-09-25 ENCOUNTER — Encounter: Payer: Self-pay | Admitting: Oncology

## 2022-09-25 ENCOUNTER — Ambulatory Visit (INDEPENDENT_AMBULATORY_CARE_PROVIDER_SITE_OTHER): Payer: Medicare Other | Admitting: Physician Assistant

## 2022-09-25 VITALS — BP 108/69 | HR 84 | Temp 97.9°F | Wt 330.0 lb

## 2022-09-25 DIAGNOSIS — T82590A Other mechanical complication of surgically created arteriovenous fistula, initial encounter: Secondary | ICD-10-CM

## 2022-09-25 DIAGNOSIS — N186 End stage renal disease: Secondary | ICD-10-CM

## 2022-09-25 NOTE — H&P (View-Only) (Signed)
POST OPERATIVE OFFICE NOTE    CC:  F/u for surgery  HPI:  Sean Valentine is a 57 y.o. male who is s/p revision of right brachiocephalic fistula with transposition and branch ligation on 08/02/2022 by Dr. Randie Heinz.  The patient's fistula was first created on 05/17/2022.  Pt returns today for follow up.  He was cleared to attempt use of the fistula on 3/20, however the dialysis center had difficulty cannulating it multiple times.  He is still using his catheter for dialysis since his fistula is not working.  He still has some numbness of the right hand during dialysis, but this is not very bothersome to him. His incisions from surgery are well-healed.  He currently dialyzes on Tuesdays, Thursdays, Saturdays at [place] location.  He is currently anticoagulated for history of DVT and PE. No Known Allergies  Current Outpatient Medications  Medication Sig Dispense Refill   acetaminophen (TYLENOL) 500 MG tablet Take 500 mg by mouth every 6 (six) hours as needed for moderate pain or mild pain.     apixaban (ELIQUIS) 5 MG TABS tablet Take 5 mg by mouth 2 (two) times daily.     atorvastatin (LIPITOR) 10 MG tablet Take 10 mg by mouth at bedtime.     bisacodyl (DULCOLAX) 5 MG EC tablet Take 5 mg by mouth every other day.     carvedilol (COREG) 25 MG tablet Take 1 tablet (25 mg total) by mouth 2 (two) times daily with a meal. (Patient taking differently: Take 50 mg by mouth 2 (two) times daily with a meal. Does not take on dialysis days in the morning) 90 tablet 1   ergocalciferol (VITAMIN D2) 1.25 MG (50000 UT) capsule Take 1 capsule by mouth 2 (two) times a week. Monday and Thursday     furosemide (LASIX) 20 MG tablet Take 20 mg by mouth See admin instructions. Take Mon., Wed., Fri., and Sun.     icosapent Ethyl (VASCEPA) 1 g capsule Take 2 g by mouth 2 (two) times daily.     insulin aspart (FIASP FLEXTOUCH) 100 UNIT/ML FlexTouch Pen Inject 20-25 Units into the skin daily as needed (scale slide for elevated  glucose).     insulin regular human CONCENTRATED (HUMULIN R) 500 UNIT/ML injection Inject 30-50 Units into the skin 3 (three) times daily with meals. Sliding scale     NIFEdipine (PROCARDIA) 10 MG capsule Take 10 mg by mouth in the morning and at bedtime.     nitroGLYCERIN (NITROSTAT) 0.4 MG SL tablet Place 0.4 mg under the tongue every 5 (five) minutes as needed for chest pain.     oxyCODONE-acetaminophen (PERCOCET) 5-325 MG tablet Take 1 tablet by mouth every 6 (six) hours as needed for severe pain. 20 tablet 0   sucroferric oxyhydroxide (VELPHORO) 500 MG chewable tablet Chew 1,000 mg by mouth 3 (three) times daily with meals.     vitamin B-12 (CYANOCOBALAMIN) 1000 MCG tablet Take 1,500 mcg by mouth once a week.     Current Facility-Administered Medications  Medication Dose Route Frequency Provider Last Rate Last Admin   0.9 %  sodium chloride infusion  250 mL Intravenous PRN Maeola Harman, MD       sodium chloride flush (NS) 0.9 % injection 3 mL  3 mL Intravenous Q12H Maeola Harman, MD         ROS:  See HPI  Physical Exam:  Incision: Right upper extremity incisions well-healed without erythema, drainage, or hematoma Extremities: Right brachiocephalic fistula with good  thrill on palpation, palpable change in diameter in the distal upper arm.  Fistula is pulsatile in the AC fossa. Palpable right radial pulse Neuro: intact motor and sensation of RUE   Dialysis Duplex: 09/25/2022  +--------------------+----------+-----------------+--------+  AVF                PSV (cm/s)Flow Vol (mL/min)Comments  +--------------------+----------+-----------------+--------+  Native artery inflow   173           859                 +--------------------+----------+-----------------+--------+  AVF Anastomosis        634                               +--------------------+----------+-----------------+--------+      +------------+----------+-------------+----------+-------------------------  ----+  OUTFLOW VEINPSV (cm/s)Diameter (cm)Depth (cm)          Describe              +------------+----------+-------------+----------+-------------------------  ----+  Prox UA        120        0.66        0.87                                   +------------+----------+-------------+----------+-------------------------  ----+  Mid UA         347        0.66        0.28                                   +------------+----------+-------------+----------+-------------------------  ----+  Dist UA        691        0.27        0.71    change in Diameter from  mid                                                      to distal segment         +------------+----------+-------------+----------+-------------------------  ----+  AC Fossa       177        0.78        0.69                                   +------------+----------+-------------+----------+-------------------------  ----+    Assessment/Plan:  This is a 57 y.o. male who is s/p: Transposition of right brachiocephalic fistula with branch ligation on 08/02/2022  -The patient was cleared for attempted use of the fistula by 08/21/2022, however his dialysis center had difficulty cannulating the fistula.  They have not been able to get it to work, and he is requiring dialysis through his catheter -His incisions in the right upper extremity are well-healed.  Repeat duplex of the right brachiocephalic fistula demonstrates improved flow and mostly improved diameter of the fistula.  There is a segment in the distal upper arm where the fistula is fairly small and stenotic, with elevated velocities of 691.  On exam the fistula has a good thrill in the upper  arm, but it becomes pulsatile in the [facility] fossa after a palpable diameter change -He still has some numbness in the right hand during dialysis, but this is not very bothersome to him.   He has a palpable right radial pulse -Given the fistula's high grade stenosis in the distal upper arm with difficulty cannulation, I believe the patient would benefit from a right brachiocephalic fistulogram. We can get this scheduled on a Monday, Wednesday, or Friday with Dr. Randie Heinz.  For the time being he will likely need to use his catheter for dialysis.  He is on Eliquis, which will need to be held prior to his procedure   Loel Dubonnet, PA-C [facility] [phone]   Clinic MD:  Randie Heinz

## 2022-09-25 NOTE — Progress Notes (Unsigned)
POST OPERATIVE OFFICE NOTE    CC:  F/u for surgery  HPI:  This is a 57 y.o. male who is s/p *** on *** by Dr. Marland Kitchen    Pt returns today for follow up.  Pt states ***   No Known Allergies  Current Outpatient Medications  Medication Sig Dispense Refill   acetaminophen (TYLENOL) 500 MG tablet Take 500 mg by mouth every 6 (six) hours as needed for moderate pain or mild pain.     apixaban (ELIQUIS) 5 MG TABS tablet Take 5 mg by mouth 2 (two) times daily.     atorvastatin (LIPITOR) 10 MG tablet Take 10 mg by mouth at bedtime.     bisacodyl (DULCOLAX) 5 MG EC tablet Take 5 mg by mouth every other day.     carvedilol (COREG) 25 MG tablet Take 1 tablet (25 mg total) by mouth 2 (two) times daily with a meal. (Patient taking differently: Take 50 mg by mouth 2 (two) times daily with a meal. Does not take on dialysis days in the morning) 90 tablet 1   ergocalciferol (VITAMIN D2) 1.25 MG (50000 UT) capsule Take 1 capsule by mouth 2 (two) times a week. Monday and Thursday     furosemide (LASIX) 20 MG tablet Take 20 mg by mouth See admin instructions. Take Mon., Wed., Fri., and Sun.     icosapent Ethyl (VASCEPA) 1 g capsule Take 2 g by mouth 2 (two) times daily.     insulin aspart (FIASP FLEXTOUCH) 100 UNIT/ML FlexTouch Pen Inject 20-25 Units into the skin daily as needed (scale slide for elevated glucose).     insulin regular human CONCENTRATED (HUMULIN R) 500 UNIT/ML injection Inject 30-50 Units into the skin 3 (three) times daily with meals. Sliding scale     NIFEdipine (PROCARDIA) 10 MG capsule Take 10 mg by mouth in the morning and at bedtime.     nitroGLYCERIN (NITROSTAT) 0.4 MG SL tablet Place 0.4 mg under the tongue every 5 (five) minutes as needed for chest pain.     oxyCODONE-acetaminophen (PERCOCET) 5-325 MG tablet Take 1 tablet by mouth every 6 (six) hours as needed for severe pain. 20 tablet 0   sucroferric oxyhydroxide (VELPHORO) 500 MG chewable tablet Chew 1,000 mg by mouth 3 (three)  times daily with meals.     vitamin B-12 (CYANOCOBALAMIN) 1000 MCG tablet Take 1,500 mcg by mouth once a week.     Current Facility-Administered Medications  Medication Dose Route Frequency Provider Last Rate Last Admin   0.9 %  sodium chloride infusion  250 mL Intravenous PRN Maeola Harman, MD       sodium chloride flush (NS) 0.9 % injection 3 mL  3 mL Intravenous Q12H Maeola Harman, MD         ROS:  See HPI  Physical Exam:  ***  Incision:  *** Extremities:  *** Neuro: *** Abdomen:  ***   Dialysis Duplex: 09/25/2022  +--------------------+----------+-----------------+--------+  AVF                PSV (cm/s)Flow Vol (mL/min)Comments  +--------------------+----------+-----------------+--------+  Native artery inflow   173           859                 +--------------------+----------+-----------------+--------+  AVF Anastomosis        634                               +--------------------+----------+-----------------+--------+     +------------+----------+-------------+----------+-------------------------  ----+  OUTFLOW VEINPSV (cm/s)Diameter (cm)Depth (cm)          Describe              +------------+----------+-------------+----------+-------------------------  ----+  Prox UA        120        0.66        0.87                                   +------------+----------+-------------+----------+-------------------------  ----+  Mid UA         347        0.66        0.28                                   +------------+----------+-------------+----------+-------------------------  ----+  Dist UA        691        0.27        0.71    change in Diameter from  mid                                                      to distal segment         +------------+----------+-------------+----------+-------------------------  ----+  AC Fossa       177        0.78        0.69                                    +------------+----------+-------------+----------+-------------------------  ----+    Assessment/Plan:  This is a 57 y.o. male who is s/p:   -***   Loel Dubonnet, PA-C [facility] [phone]   Clinic MD:  ***

## 2022-09-30 ENCOUNTER — Other Ambulatory Visit: Payer: Self-pay

## 2022-09-30 DIAGNOSIS — N186 End stage renal disease: Secondary | ICD-10-CM

## 2022-09-30 DIAGNOSIS — T82590A Other mechanical complication of surgically created arteriovenous fistula, initial encounter: Secondary | ICD-10-CM

## 2022-09-30 MED ORDER — SODIUM CHLORIDE 0.9% FLUSH: 3.0000 mL | Freq: Two times a day (BID) | INTRAVENOUS | Status: DC

## 2022-09-30 MED ORDER — SODIUM CHLORIDE 0.9 % IV SOLN: 250.0000 mL | INTRAVENOUS | Status: DC | PRN

## 2022-09-30 MED ORDER — SODIUM CHLORIDE 0.9% FLUSH: 3.0000 mL | INTRAVENOUS | Status: DC | PRN

## 2022-10-21 ENCOUNTER — Ambulatory Visit (HOSPITAL_COMMUNITY)
Admission: RE | Admit: 2022-10-21 | Discharge: 2022-10-21 | Disposition: A | Discharge status: Home / Self Care | Payer: BC Managed Care – PPO | Source: Ambulatory Visit | Attending: Vascular Surgery | Admitting: Vascular Surgery

## 2022-10-21 ENCOUNTER — Encounter (HOSPITAL_COMMUNITY)
Admission: RE | Disposition: A | Discharge status: Home / Self Care | Payer: Self-pay | Source: Ambulatory Visit | Attending: Vascular Surgery

## 2022-10-21 ENCOUNTER — Other Ambulatory Visit: Payer: Self-pay

## 2022-10-21 DIAGNOSIS — Z86711 Personal history of pulmonary embolism: Secondary | ICD-10-CM | POA: Insufficient documentation

## 2022-10-21 DIAGNOSIS — T82858A Stenosis of vascular prosthetic devices, implants and grafts, initial encounter: Secondary | ICD-10-CM | POA: Diagnosis present

## 2022-10-21 DIAGNOSIS — Z7901 Long term (current) use of anticoagulants: Secondary | ICD-10-CM | POA: Insufficient documentation

## 2022-10-21 DIAGNOSIS — T82898A Other specified complication of vascular prosthetic devices, implants and grafts, initial encounter: Secondary | ICD-10-CM

## 2022-10-21 DIAGNOSIS — Z992 Dependence on renal dialysis: Secondary | ICD-10-CM

## 2022-10-21 DIAGNOSIS — T82590A Other mechanical complication of surgically created arteriovenous fistula, initial encounter: Secondary | ICD-10-CM

## 2022-10-21 DIAGNOSIS — Y832 Surgical operation with anastomosis, bypass or graft as the cause of abnormal reaction of the patient, or of later complication, without mention of misadventure at the time of the procedure: Secondary | ICD-10-CM | POA: Insufficient documentation

## 2022-10-21 DIAGNOSIS — Z86718 Personal history of other venous thrombosis and embolism: Secondary | ICD-10-CM | POA: Diagnosis not present

## 2022-10-21 DIAGNOSIS — N186 End stage renal disease: Secondary | ICD-10-CM | POA: Diagnosis not present

## 2022-10-21 HISTORY — PX: PERIPHERAL VASCULAR BALLOON ANGIOPLASTY: CATH118281

## 2022-10-21 HISTORY — PX: A/V FISTULAGRAM: CATH118298

## 2022-10-21 LAB — POCT I-STAT, CHEM 8
BUN: 78 mg/dL — ABNORMAL HIGH (ref 6–20)
Calcium, Ion: 1.22 mmol/L (ref 1.15–1.40)
Chloride: 100 mmol/L (ref 98–111)
Creatinine, Ser: 6.7 mg/dL — ABNORMAL HIGH (ref 0.61–1.24)
Glucose, Bld: 95 mg/dL (ref 70–99)
HCT: 36 % — ABNORMAL LOW (ref 39.0–52.0)
Hemoglobin: 12.2 g/dL — ABNORMAL LOW (ref 13.0–17.0)
Potassium: 3.6 mmol/L (ref 3.5–5.1)
Sodium: 138 mmol/L (ref 135–145)
TCO2: 25 mmol/L (ref 22–32)

## 2022-10-21 LAB — GLUCOSE, CAPILLARY: Glucose-Capillary: 120 mg/dL — ABNORMAL HIGH (ref 70–99)

## 2022-10-21 SURGERY — A/V FISTULAGRAM
Anesthesia: LOCAL | Laterality: Right

## 2022-10-21 MED ORDER — ONDANSETRON HCL 4 MG/2ML IJ SOLN: 4.0000 mg | Freq: Four times a day (QID) | INTRAMUSCULAR | Status: DC | PRN

## 2022-10-21 MED ORDER — VERAPAMIL HCL 2.5 MG/ML IV SOLN
INTRAVENOUS | Status: AC
  Filled 2022-10-21: qty 2

## 2022-10-21 MED ORDER — HEPARIN SODIUM (PORCINE) 1000 UNIT/ML IJ SOLN
INTRAMUSCULAR | Status: AC
  Filled 2022-10-21: qty 10

## 2022-10-21 MED ORDER — HYDRALAZINE HCL 20 MG/ML IJ SOLN: 5.0000 mg | INTRAMUSCULAR | Status: DC | PRN

## 2022-10-21 MED ORDER — VERAPAMIL HCL 2.5 MG/ML IV SOLN
INTRAVENOUS | Status: DC | PRN
  Administered 2022-10-21: 10 mL via INTRA_ARTERIAL

## 2022-10-21 MED ORDER — ACETAMINOPHEN 325 MG PO TABS: 650.0000 mg | ORAL_TABLET | ORAL | Status: DC | PRN

## 2022-10-21 MED ORDER — IODIXANOL 320 MG/ML IV SOLN
INTRAVENOUS | Status: DC | PRN
  Administered 2022-10-21: 15 mL

## 2022-10-21 MED ORDER — MIDAZOLAM HCL 2 MG/2ML IJ SOLN
INTRAMUSCULAR | Status: AC
  Filled 2022-10-21: qty 2

## 2022-10-21 MED ORDER — LIDOCAINE HCL (PF) 1 % IJ SOLN
INTRAMUSCULAR | Status: DC | PRN
  Administered 2022-10-21: 5 mL

## 2022-10-21 MED ORDER — LIDOCAINE HCL (PF) 1 % IJ SOLN
INTRAMUSCULAR | Status: AC
  Filled 2022-10-21: qty 30

## 2022-10-21 MED ORDER — HEPARIN SODIUM (PORCINE) 1000 UNIT/ML IJ SOLN
INTRAMUSCULAR | Status: DC | PRN
  Administered 2022-10-21: 3000 [IU] via INTRAVENOUS

## 2022-10-21 MED ORDER — SODIUM CHLORIDE 0.9% FLUSH: 3.0000 mL | INTRAVENOUS | Status: DC | PRN

## 2022-10-21 MED ORDER — HEPARIN (PORCINE) IN NACL 1000-0.9 UT/500ML-% IV SOLN
INTRAVENOUS | Status: DC | PRN
  Administered 2022-10-21: 500 mL

## 2022-10-21 MED ORDER — FENTANYL CITRATE (PF) 100 MCG/2ML IJ SOLN
INTRAMUSCULAR | Status: AC
  Filled 2022-10-21: qty 2

## 2022-10-21 MED ORDER — LABETALOL HCL 5 MG/ML IV SOLN: 10.0000 mg | INTRAVENOUS | Status: DC | PRN

## 2022-10-21 MED ORDER — SODIUM CHLORIDE 0.9% FLUSH: 3.0000 mL | Freq: Two times a day (BID) | INTRAVENOUS | Status: DC

## 2022-10-21 MED ORDER — SODIUM CHLORIDE 0.9 % IV SOLN: 250.0000 mL | INTRAVENOUS | Status: DC | PRN

## 2022-10-21 MED ORDER — MORPHINE SULFATE (PF) 2 MG/ML IV SOLN: 2.0000 mg | INTRAVENOUS | Status: DC | PRN

## 2022-10-21 MED ORDER — MIDAZOLAM HCL 2 MG/2ML IJ SOLN
INTRAMUSCULAR | Status: DC | PRN
  Administered 2022-10-21: 1 mg via INTRAVENOUS

## 2022-10-21 MED ORDER — OXYCODONE HCL 5 MG PO TABS: 5.0000 mg | ORAL_TABLET | ORAL | Status: DC | PRN

## 2022-10-21 MED ORDER — FENTANYL CITRATE (PF) 100 MCG/2ML IJ SOLN
INTRAMUSCULAR | Status: DC | PRN
  Administered 2022-10-21: 50 ug via INTRAVENOUS

## 2022-10-21 SURGICAL SUPPLY — 19 items
BAG SNAP BAND KOVER 36X36 (MISCELLANEOUS) ×2 IMPLANT
BALLN LUTONIX AV 8X60X75 (BALLOONS) ×2
BALLN MUSTANG 7X80X75 (BALLOONS) ×2
BALLOON LUTONIX AV 8X60X75 (BALLOONS) IMPLANT
BALLOON MUSTANG 7X80X75 (BALLOONS) IMPLANT
BAND CMPR LRG ZPHR (HEMOSTASIS) ×2
BAND ZEPHYR COMPRESS 30 LONG (HEMOSTASIS) IMPLANT
CATH ANGIO 5F BER2 65CM (CATHETERS) IMPLANT
COVER DOME SNAP 22 D (MISCELLANEOUS) ×2 IMPLANT
GLIDESHEATH SLEND SS 6F .021 (SHEATH) IMPLANT
KIT ENCORE 26 ADVANTAGE (KITS) IMPLANT
PROTECTION STATION PRESSURIZED (MISCELLANEOUS) ×2
SHEATH PROBE COVER 6X72 (BAG) ×2 IMPLANT
STATION PROTECTION PRESSURIZED (MISCELLANEOUS) ×2 IMPLANT
STOPCOCK MORSE 400PSI 3WAY (MISCELLANEOUS) ×2 IMPLANT
TRAY PV CATH (CUSTOM PROCEDURE TRAY) ×2 IMPLANT
TUBING CIL FLEX 10 FLL-RA (TUBING) ×2 IMPLANT
WIRE STARTER BENTSON 035X150 (WIRE) IMPLANT
WIRE TORQFLEX AUST .018X40CM (WIRE) IMPLANT

## 2022-10-21 NOTE — Discharge Instructions (Signed)

## 2022-10-21 NOTE — Interval H&P Note (Signed)
History and Physical Interval Note:  10/21/2022 6:49 AM  Sean Valentine  has presented today for surgery, with the diagnosis of right arm - instage renal.  The various methods of treatment have been discussed with the patient and family. After consideration of risks, benefits and other options for treatment, the patient has consented to  Procedure(s): A/V Fistulagram (Right) as a surgical intervention.  The patient's history has been reviewed, patient examined, no change in status, stable for surgery.  I have reviewed the patient's chart and labs.  Questions were answered to the patient's satisfaction.     [name]

## 2022-10-21 NOTE — Op Note (Signed)
    Patient name: Sean Valentine MRN: [id number] DOB: 08-25-1965 Sex: male  10/21/2022 Pre-operative Diagnosis: End-stage renal disease, malfunction right arm AV fistula Post-operative diagnosis:  Same Surgeon:  Luanna Salk. Randie Heinz, MD Procedure Performed: 1.  Ultrasound-guided cannulation right radial artery 2.  Right upper extremity shuntogram 3.  Drug-coated balloon angioplasty right cephalic vein fistula with 8 x 60 mm Lutonix 4.  Moderate sedation with fentanyl and Versed for 20 minutes  Indications: 57 year old male with end-stage renal disease currently dialyzing via catheter.  He has undergone a brachiocephalic fistula creation as well as transposition with branch ligation 2 months ago but has had difficulty with cannulation.  He is now indicated for fistulogram possible intervention.  Findings: The radial artery was patent was easily cannulated.  The brachial artery to fistula anastomosis was healthy and there was a long segment 5 cm stenosis with pre and poststenotic dilatation.  After drug-coated balloon angioplasty this was reduced from 80% to 0% and there is brisk flow through the fistula.  Fistula can be cannulated in 2 weeks.   Procedure:  The patient was identified in the holding area and taken to room 8.  The patient was then placed supine on the table and prepped and draped in the usual sterile fashion.  A time out was called.  Ultrasound was used to evaluate the right radial artery at the wrist.  The area was anesthetized with 1% lidocaine and cannulated with a micropuncture needle followed by an 014 wire followed by a 6-5 slender sheath.  3 mg of verapamil was administered via the sheath as well as 3000 units of heparin IV.  A Bentson wire was placed followed by a Berenstein catheter into the left brachial artery in the mid upper arm and limited angiography with fistulogram was performed.  The fistula was then cannulated with catheter and Bentson wire placed higher in the upper arm and  central fistulogram was performed.  With the above findings we left the wire and then primarily balloon dilated with a 7 mm balloon followed by drug-coated balloon angioplasty nominal pressure for 3 minutes with a 60 mm with times.  Completion angiography demonstrated brisk flow there was a much improved thrill throughout the fistula.  Satisfied with this we remove the catheter wire.  A separate band was placed and the sheath was removed.  Patient tolerated procedure without any complication.  Contrast: 15cc   Alonah Lineback C. Randie Heinz, MD [facility] [place] Office: [phone] Pager: [phone]

## 2022-10-22 ENCOUNTER — Encounter (HOSPITAL_COMMUNITY): Payer: Self-pay | Admitting: Vascular Surgery

## 2022-12-17 NOTE — Progress Notes (Unsigned)
[facility]  [address],  [place]  [address] [phone]  I connected with Sean Valentine on 08/14/2021 at 10:45 AM EDT by telephone visit and verified that [name] using two identifiers.   I discussed the limitations, risks, security and privacy concerns of performing an evaluation and management service by telemedicine and the availability of in-person appointments. I also discussed with the patient that there may be a patient responsible charge related to this service. The patient expressed understanding and agreed to proceed.   Other persons participating in the visit and their role in the encounter:  *** Patient's location:  Home Provider's location:  Office  Clinic Day:  08/14/2020  Referring physician: Gordan Payment., MD  This document serves as a record of services personally performed by Lavella Myren Kirby Funk, MD. It was created on their behalf by [facility] E, a trained medical scribe. The creation of this record is based on the scribe's personal observations and the provider's statements to them.  HISTORY OF PRESENT ILLNESS:  The patient is a 57 y.o. 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  Due to his elevated iron parameters, the patient undergoes periodic phlebotomies to keep his ferritin less than 100.  He comes in today to reassess his iron parameters.  Since his last visit, the patient has been doing well.  He denies having any systemic symptoms or complications related to his underlying hemochromatosis.  PHYSICAL EXAM:  There were no vitals taken for this visit. Wt Readings from Last 3 Encounters:  10/21/22 (!) 330 lb (149.7 kg)  09/25/22 (!) 330 lb (149.7 kg)  08/02/22 (!) 330 lb (149.7 kg)   There is no height or weight on file to calculate BMI. Performance status (ECOG): 1 - Symptomatic but completely ambulatory Physical Exam Constitutional:      Appearance: Normal appearance. He is not  ill-appearing.  HENT:     Mouth/Throat:     Mouth: Mucous membranes are moist.     Pharynx: Oropharynx is clear. No oropharyngeal exudate or posterior oropharyngeal erythema.  Cardiovascular:     Rate and Rhythm: Normal rate and regular rhythm.     Heart sounds: No murmur heard.    No friction rub. No gallop.  Pulmonary:     Effort: Pulmonary effort is normal. No respiratory distress.     Breath sounds: Normal breath sounds. No wheezing, rhonchi or rales.  Abdominal:     General: Bowel sounds are normal. There is no distension.     Palpations: Abdomen is soft. There is no mass.     Tenderness: There is no abdominal tenderness.  Musculoskeletal:        General: No swelling.     Right lower leg: No edema.     Left lower leg: No edema.  Lymphadenopathy:     Cervical: No cervical adenopathy.     Upper Body:     Right upper body: No supraclavicular or axillary adenopathy.     Left upper body: No supraclavicular or axillary adenopathy.     Lower Body: No right inguinal adenopathy. No left inguinal adenopathy.  Skin:    General: Skin is warm.     Coloration: Skin is not jaundiced.     Findings: No lesion or rash.  Neurological:     General: No focal deficit present.     Mental Status: He is alert and oriented to person, place, and time. Mental status is at baseline.  Psychiatric:  Mood and Affect: Mood normal.        Behavior: Behavior normal.        Thought Content: Thought content normal.     LABS:    Ref. Range 02/13/2021 15:57  Iron Latest Ref Range: 45 - 182 ug/dL 841  UIBC Latest Units: ug/dL 324  TIBC Latest Ref Range: 250 - 450 ug/dL 401  Saturation Ratios Latest Ref Range: 17.9 - 39.5 % 36  Ferritin Latest Ref Range: 24 - 336 ng/mL 341 (H)    Ref. Range 02/13/2021 00:00  Sodium Latest Ref Range: 137 - 147  140  Potassium Latest Ref Range: 3.4 - 5.3  3.5  Chloride Latest Ref Range: 99 - 108  105  CO2 Latest Ref Range: 13 - 22  23 (A)  Glucose Unknown 144   BUN Latest Ref Range: 4 - 21  68 (A)  Creatinine Latest Ref Range: 0.6 - 1.3  2.2 (A)  Calcium Latest Ref Range: 8.7 - 10.7  9.0  Alkaline Phosphatase Latest Ref Range: 25 - 125  48  Albumin Latest Ref Range: 3.5 - 5.0  3.6  AST Latest Ref Range: 14 - 40  20  ALT Latest Ref Range: 10 - 40  14  Bilirubin, Total Unknown 0.4  WBC Unknown 10.1  RBC Latest Ref Range: 3.87 - 5.11  4.24  Hemoglobin Latest Ref Range: 13.5 - 17.5  13.4 (A)  HCT Latest Ref Range: 41 - 53  39 (A)  MCV Latest Ref Range: 80 - 94  92  Platelets Latest Ref Range: 150 - 399  247   Ferritin 24 - 336 ng/mL 787 High    Iron 50 - 212 ug/dL 52  Transferrin 027 - 253 mg/dL 664  Total Iron Binding Capacity (TIBC) 290 - 518 ug/dL 403  Transferrin Saturation 15 - 45 % 16    ASSESSMENT & PLAN:  Assessment/Plan:  A 57 y.o. 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  Although his ferritin is much greater than 100, I will arrange for him to be phlebotomized this month and in 3 months.  I will see him back in 6 months to reassess his iron parameters. The patient understands all the plans discussed today and is in agreement with them.     I, Foye Deer, am acting as scribe for Weston Settle, MD    I have reviewed this report as typed by the medical scribe, and it is complete and accurate.  Elpidio Thielen Kirby Funk, MD

## 2022-12-18 ENCOUNTER — Other Ambulatory Visit: Payer: Self-pay | Admitting: Oncology

## 2022-12-18 ENCOUNTER — Telehealth: Payer: Self-pay | Admitting: Oncology

## 2022-12-18 ENCOUNTER — Inpatient Hospital Stay: Payer: Medicare Other | Attending: Oncology | Admitting: Oncology

## 2022-12-18 NOTE — Telephone Encounter (Signed)
Patient has been scheduled. Aware of appt date and time   Scheduling Message Entered by Rennis Harding A on 12/18/2022 at 11:04 AM Priority: Routine <No visit type provided>  Department: [facility]  Provider:  Scheduling Notes:  Labs 06-19-23  Appt 06-20-23

## 2023-02-05 ENCOUNTER — Other Ambulatory Visit: Payer: Self-pay | Admitting: *Deleted

## 2023-02-05 DIAGNOSIS — N186 End stage renal disease: Secondary | ICD-10-CM

## 2023-02-06 ENCOUNTER — Ambulatory Visit (HOSPITAL_COMMUNITY)
Admission: RE | Admit: 2023-02-06 | Discharge: 2023-02-06 | Disposition: A | Discharge status: Home / Self Care | Payer: BC Managed Care – PPO | Source: Ambulatory Visit | Attending: Vascular Surgery | Admitting: Vascular Surgery

## 2023-02-06 ENCOUNTER — Ambulatory Visit (INDEPENDENT_AMBULATORY_CARE_PROVIDER_SITE_OTHER): Payer: BC Managed Care – PPO | Admitting: Vascular Surgery

## 2023-02-06 ENCOUNTER — Encounter: Payer: Self-pay | Admitting: Vascular Surgery

## 2023-02-06 VITALS — BP 128/80 | HR 88 | Temp 98.0°F | Resp 20 | Ht 77.0 in | Wt 338.0 lb

## 2023-02-06 DIAGNOSIS — Z992 Dependence on renal dialysis: Secondary | ICD-10-CM

## 2023-02-06 DIAGNOSIS — N186 End stage renal disease: Secondary | ICD-10-CM | POA: Insufficient documentation

## 2023-02-06 NOTE — H&P (View-Only) (Signed)
REASON FOR VISIT:   Sluggish flow in the right AV fistula.  The consult is requested by Dr. Glenna Fellows.   MEDICAL ISSUES:   END-STAGE RENAL DISEASE: This patient has sluggish flow in his right upper arm fistula.  He underwent an intervention on 10/21/2022 and had an excellent result with a drug-coated balloon in the proximal fistula.  He now has a stenosis in the central portion of the fistula by duplex.  I have recommended that we proceed with a fistulogram.  He dialyzes on Tuesdays Thursdays and Saturdays.  We will schedule this on a Monday Wednesday or Friday.  He is on Eliquis so this will have to be held for 48 hours prior to the procedure.  HPI:   Sean Valentine is a pleasant 57 y.o. male with end-stage renal disease.  He dialyzes on Tuesdays Thursdays and Saturdays.  He has a right upper arm fistula.  Most recently on 10/21/2022 he underwent a fistulogram via a right radial approach.  He had drug-coated balloon angioplasty of a proximal stenosis with an 8 mm x 60 mm loop tonics balloon with an excellent result.  He developed recurrent problems with sluggish flow in the fistula and was sent back to [place] for vascular consultation.  He denies any specific problems with the fistula.  Past Medical History:  Diagnosis Date   Anemia, chronic disease 05/26/2018   Formatting of this note might be different from the original. Eval with [facility] and phlebotomy for Hemochromotosis.   Anticoagulant long-term use 11/26/2016   Arthritis of right foot 05/08/2016   Atypical chest pain 09/13/2020   Bacteremia due to group B Streptococcus 11/03/2020   Benign hypertension with CKD (chronic kidney disease) stage III (HCC) 07/12/2019   Bilateral leg edema    Cellulitis 11/26/2020   Chest pain at rest 08/16/2020   CHF (congestive heart failure) (HCC) 03/05/2012   COVID-19 virus infection 02/09/2020   Deformity of toe 08/01/2020   Diabetic ulcer of toe of right foot associated with type 2 diabetes mellitus, with  fat layer exposed (HCC) 01/10/2020   Formatting of this note might be different from the original. Distal tuft right fifth toe   Dyslipidemia 03/05/2012   Essential hypertension 11/26/2016   GERD (gastroesophageal reflux disease) 08/01/2020   Gouty arthritis of left hand 01/15/2017   High risk medication use 11/26/2016   History of COVID-19 05/05/2020   Formatting of this note might be different from the original. 02/2020   History of diabetic ulcer of foot 04/04/2021   History of kidney stones    History of pulmonary embolism 11/26/2016   Formatting of this note might be different from the original. Felt severe with permanent anticoagulation and Filter placed because of burden.   Hypertension    Kidney failure    stage 4   Localized edema 03/05/2012   LVH (left ventricular hypertrophy) 05/26/2018   Formatting of this note might be different from the original. 04/2018 EF 555-60%   Malaise and fatigue 11/26/2016   Medication monitoring encounter 11/15/2020   Metabolic acidosis 01/21/2022   MGUS (monoclonal gammopathy of unknown significance) 06/03/2018   Formatting of this note might be different from the original. Possible. Seen on SPEP 05/2018   Mild persistent asthma 11/26/2016   Mixed hyperlipidemia 11/26/2016   Morbid (severe) obesity due to excess calories (HCC) 03/05/2012   Morbid obesity with body mass index (BMI) of 40.0 to 49.9 (HCC) 11/26/2016   Nail dystrophy 05/20/2018   Nephrolithiasis 03/05/2012  Nonalcoholic steatohepatitis (NASH) 05/26/2018   Obesity, Class III, BMI 40-49.9 (morbid obesity) (HCC) 11/26/2016   Other hemochromatosis 03/21/2020   PICC (peripherally inserted central catheter) in place 12/12/2020   Posterior tibial tendinitis of right lower extremity 08/01/2020   Pre-ulcerative calluses 06/18/2019   Primary central sleep apnea 11/26/2016   Formatting of this note might be different from the original. bipap   Sepsis (HCC) 10/30/2020   Sepsis due to  cellulitis (HCC) 10/30/2020   Sleep apnea 03/05/2012   Formatting of this note might be different from the original. [place].   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 IMO 10/01 Updates  Formatting of this note might be different from the original. 2000   Ulcer of right foot with fat layer exposed (HCC) 12/05/2020    Family History  Problem Relation Age of Onset   Cancer Mother    Clotting disorder Mother    Stroke Mother    Alzheimer's disease Mother    Diabetes Mother    Diabetes Father     SOCIAL HISTORY: Social History   Tobacco Use   Smoking status: Former    Current packs/day: 0.00    Types: Cigarettes    Start date: 1985    Quit date: 1986    Years since quitting: 38.7   Smokeless tobacco: Never  Substance Use Topics   Alcohol use: Not Currently    No Known Allergies  Current Outpatient Medications  Medication Sig Dispense Refill   acetaminophen (TYLENOL) 500 MG tablet Take 500 mg by mouth every 6 (six) hours as needed for moderate pain or mild pain.     apixaban (ELIQUIS) 5 MG TABS tablet Take 5 mg by mouth 2 (two) times daily.     atorvastatin (LIPITOR) 10 MG tablet Take 10 mg by mouth at bedtime.     bisacodyl (DULCOLAX) 5 MG EC tablet Take 5 mg by mouth every other day.     carvedilol (COREG) 25 MG tablet Take 1 tablet (25 mg total) by mouth 2 (two) times daily with a meal. (Patient taking differently: Take 50 mg by mouth 2 (two) times daily with a meal. Does not take on dialysis days in the morning) 90 tablet 1   ergocalciferol (VITAMIN D2) 1.25 MG (50000 UT) capsule Take 1 capsule by mouth 2 (two) times a week. Monday and Thursday     icosapent  Ethyl (VASCEPA) 1 g capsule Take 2 g by mouth 2 (two) times daily.     insulin aspart (FIASP FLEXTOUCH) 100 UNIT/ML FlexTouch Pen Inject 20-25 Units into the skin daily as needed (scale slide for elevated glucose).     insulin regular human CONCENTRATED (HUMULIN R) 500 UNIT/ML injection Inject 30-50 Units into the skin 3 (three) times daily with meals. Sliding scale     nitroGLYCERIN (NITROSTAT) 0.4 MG SL tablet Place 0.4 mg under the tongue every 5 (five) minutes as needed for chest pain.     sucroferric oxyhydroxide (VELPHORO) 500 MG chewable tablet Chew 1,000 mg by mouth 3 (three) times daily with meals.     torsemide (DEMADEX) 100 MG tablet Take by mouth.     vitamin B-12 (CYANOCOBALAMIN) 1000 MCG tablet Take  1,500 mcg by mouth once a week.     Current Facility-Administered Medications  Medication Dose Route Frequency Provider Last Rate Last Admin   0.9 %  sodium chloride infusion  250 mL Intravenous PRN Maeola Harman, MD       0.9 %  sodium chloride infusion  250 mL Intravenous PRN Maeola Harman, MD       sodium chloride flush (NS) 0.9 % injection 3 mL  3 mL Intravenous Q12H Maeola Harman, MD       sodium chloride flush (NS) 0.9 % injection 3 mL  3 mL Intravenous Q12H Maeola Harman, MD       sodium chloride flush (NS) 0.9 % injection 3 mL  3 mL Intravenous PRN Maeola Harman, MD        REVIEW OF SYSTEMS:  [X]  denotes positive finding, [ ]  denotes negative finding Cardiac  Comments:  Chest pain or chest pressure:    Shortness of breath upon exertion:    Short of breath when lying flat:    Irregular heart rhythm:        Vascular    Pain in calf, thigh, or hip brought on by ambulation:    Pain in feet at night that wakes you up from your sleep:     Blood clot in your veins:    Leg swelling:         Pulmonary    Oxygen at home:    Productive cough:     Wheezing:         Neurologic    Sudden weakness in arms or legs:      Sudden numbness in arms or legs:     Sudden onset of difficulty speaking or slurred speech:    Temporary loss of vision in one eye:     Problems with dizziness:         Gastrointestinal    Blood in stool:     Vomited blood:         Genitourinary    Burning when urinating:     Blood in urine:        Psychiatric    Major depression:         Hematologic    Bleeding problems:    Problems with blood clotting too easily:        Skin    Rashes or ulcers:        Constitutional    Fever or chills:     PHYSICAL EXAM:   Vitals:   02/06/23 1059  BP: 128/80  Pulse: 88  Resp: 20  Temp: 98 F (36.7 C)  SpO2: 98%  Weight: (!) 338 lb (153.3 kg)  Height: 6\' 5"  (1.956 m)    GENERAL: The patient is a well-nourished male, in no acute distress. The vital signs are documented above. CARDIAC: There is a regular rate and rhythm.  VASCULAR: The proximal fistula is pulsatile. PULMONARY: There is good air exchange bilaterally without wheezing or rales. MUSCULOSKELETAL: There are no major deformities or cyanosis. NEUROLOGIC: No focal weakness or paresthesias are detected. SKIN: There are no ulcers or rashes noted. PSYCHIATRIC: The patient has a normal affect.  DATA:    FISTULOGRAM: I reviewed his fistulogram that was done on 10/21/2022.  He had a long segment stenosis in the proximal fistula which was successfully addressed with an 8 mm x 60 mm loop tonics balloon.  This was done via a radial approach.  He had an excellent  result with no residual stenosis.  DUPLEX AV FISTULA: I have independently interpreted his duplex of his right arm fistula today.  He is noted to have significant stenosis in the mid upper arm with a peak systolic velocity of 618 cm/s.  Of note, the patient just had dialysis prior to coming here in the area of stenosis was at one of the access sites today.    Waverly Ferrari [facility] [phone]

## 2023-02-06 NOTE — Progress Notes (Signed)
REASON FOR VISIT:   Sluggish flow in the right AV fistula.  The consult is requested by Dr. Glenna Fellows.   MEDICAL ISSUES:   END-STAGE RENAL DISEASE: This patient has sluggish flow in his right upper arm fistula.  He underwent an intervention on 10/21/2022 and had an excellent result with a drug-coated balloon in the proximal fistula.  He now has a stenosis in the central portion of the fistula by duplex.  I have recommended that we proceed with a fistulogram.  He dialyzes on Tuesdays Thursdays and Saturdays.  We will schedule this on a Monday Wednesday or Friday.  He is on Eliquis so this will have to be held for 48 hours prior to the procedure.  HPI:   Sean Valentine is a pleasant 57 y.o. male with end-stage renal disease.  He dialyzes on Tuesdays Thursdays and Saturdays.  He has a right upper arm fistula.  Most recently on 10/21/2022 he underwent a fistulogram via a right radial approach.  He had drug-coated balloon angioplasty of a proximal stenosis with an 8 mm x 60 mm loop tonics balloon with an excellent result.  He developed recurrent problems with sluggish flow in the fistula and was sent back to [place] for vascular consultation.  He denies any specific problems with the fistula.  Past Medical History:  Diagnosis Date   Anemia, chronic disease 05/26/2018   Formatting of this note might be different from the original. Eval with Lewis and phlebotomy for Hemochromotosis.   Anticoagulant long-term use 11/26/2016   Arthritis of right foot 05/08/2016   Atypical chest pain 09/13/2020   Bacteremia due to group B Streptococcus 11/03/2020   Benign hypertension with CKD (chronic kidney disease) stage III (HCC) 07/12/2019   Bilateral leg edema    Cellulitis 11/26/2020   Chest pain at rest 08/16/2020   CHF (congestive heart failure) (HCC) 03/05/2012   COVID-19 virus infection 02/09/2020   Deformity of toe 08/01/2020   Diabetic ulcer of toe of right foot associated with type 2 diabetes mellitus, with  fat layer exposed (HCC) 01/10/2020   Formatting of this note might be different from the original. Distal tuft right fifth toe   Dyslipidemia 03/05/2012   Essential hypertension 11/26/2016   GERD (gastroesophageal reflux disease) 08/01/2020   Gouty arthritis of left hand 01/15/2017   High risk medication use 11/26/2016   History of COVID-19 05/05/2020   Formatting of this note might be different from the original. 02/2020   History of diabetic ulcer of foot 04/04/2021   History of kidney stones    History of pulmonary embolism 11/26/2016   Formatting of this note might be different from the original. Felt severe with permanent anticoagulation and Filter placed because of burden.   Hypertension    Kidney failure    stage 4   Localized edema 03/05/2012   LVH (left ventricular hypertrophy) 05/26/2018   Formatting of this note might be different from the original. 04/2018 EF 555-60%   Malaise and fatigue 11/26/2016   Medication monitoring encounter 11/15/2020   Metabolic acidosis 01/21/2022   MGUS (monoclonal gammopathy of unknown significance) 06/03/2018   Formatting of this note might be different from the original. Possible. Seen on SPEP 05/2018   Mild persistent asthma 11/26/2016   Mixed hyperlipidemia 11/26/2016   Morbid (severe) obesity due to excess calories (HCC) 03/05/2012   Morbid obesity with body mass index (BMI) of 40.0 to 49.9 (HCC) 11/26/2016   Nail dystrophy 05/20/2018   Nephrolithiasis 03/05/2012  Nonalcoholic steatohepatitis (NASH) 05/26/2018   Obesity, Class III, BMI 40-49.9 (morbid obesity) (HCC) 11/26/2016   Other hemochromatosis 03/21/2020   PICC (peripherally inserted central catheter) in place 12/12/2020   Posterior tibial tendinitis of right lower extremity 08/01/2020   Pre-ulcerative calluses 06/18/2019   Primary central sleep apnea 11/26/2016   Formatting of this note might be different from the original. bipap   Sepsis (HCC) 10/30/2020   Sepsis due to  cellulitis (HCC) 10/30/2020   Sleep apnea 03/05/2012   Formatting of this note might be different from the original. [place].   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 IMO 10/01 Updates  Formatting of this note might be different from the original. 2000   Ulcer of right foot with fat layer exposed (HCC) 12/05/2020    Family History  Problem Relation Age of Onset   Cancer Mother    Clotting disorder Mother    Stroke Mother    Alzheimer's disease Mother    Diabetes Mother    Diabetes Father     SOCIAL HISTORY: Social History   Tobacco Use   Smoking status: Former    Current packs/day: 0.00    Types: Cigarettes    Start date: 1985    Quit date: 1986    Years since quitting: 38.7   Smokeless tobacco: Never  Substance Use Topics   Alcohol use: Not Currently    No Known Allergies  Current Outpatient Medications  Medication Sig Dispense Refill   acetaminophen (TYLENOL) 500 MG tablet Take 500 mg by mouth every 6 (six) hours as needed for moderate pain or mild pain.     apixaban (ELIQUIS) 5 MG TABS tablet Take 5 mg by mouth 2 (two) times daily.     atorvastatin (LIPITOR) 10 MG tablet Take 10 mg by mouth at bedtime.     bisacodyl (DULCOLAX) 5 MG EC tablet Take 5 mg by mouth every other day.     carvedilol (COREG) 25 MG tablet Take 1 tablet (25 mg total) by mouth 2 (two) times daily with a meal. (Patient taking differently: Take 50 mg by mouth 2 (two) times daily with a meal. Does not take on dialysis days in the morning) 90 tablet 1   ergocalciferol (VITAMIN D2) 1.25 MG (50000 UT) capsule Take 1 capsule by mouth 2 (two) times a week. Monday and Thursday     icosapent  Ethyl (VASCEPA) 1 g capsule Take 2 g by mouth 2 (two) times daily.     insulin aspart (FIASP FLEXTOUCH) 100 UNIT/ML FlexTouch Pen Inject 20-25 Units into the skin daily as needed (scale slide for elevated glucose).     insulin regular human CONCENTRATED (HUMULIN R) 500 UNIT/ML injection Inject 30-50 Units into the skin 3 (three) times daily with meals. Sliding scale     nitroGLYCERIN (NITROSTAT) 0.4 MG SL tablet Place 0.4 mg under the tongue every 5 (five) minutes as needed for chest pain.     sucroferric oxyhydroxide (VELPHORO) 500 MG chewable tablet Chew 1,000 mg by mouth 3 (three) times daily with meals.     torsemide (DEMADEX) 100 MG tablet Take by mouth.     vitamin B-12 (CYANOCOBALAMIN) 1000 MCG tablet Take  1,500 mcg by mouth once a week.     Current Facility-Administered Medications  Medication Dose Route Frequency Provider Last Rate Last Admin   0.9 %  sodium chloride infusion  250 mL Intravenous PRN Maeola Harman, MD       0.9 %  sodium chloride infusion  250 mL Intravenous PRN Maeola Harman, MD       sodium chloride flush (NS) 0.9 % injection 3 mL  3 mL Intravenous Q12H Maeola Harman, MD       sodium chloride flush (NS) 0.9 % injection 3 mL  3 mL Intravenous Q12H Maeola Harman, MD       sodium chloride flush (NS) 0.9 % injection 3 mL  3 mL Intravenous PRN Maeola Harman, MD        REVIEW OF SYSTEMS:  [X]  denotes positive finding, [ ]  denotes negative finding Cardiac  Comments:  Chest pain or chest pressure:    Shortness of breath upon exertion:    Short of breath when lying flat:    Irregular heart rhythm:        Vascular    Pain in calf, thigh, or hip brought on by ambulation:    Pain in feet at night that wakes you up from your sleep:     Blood clot in your veins:    Leg swelling:         Pulmonary    Oxygen at home:    Productive cough:     Wheezing:         Neurologic    Sudden weakness in arms or legs:      Sudden numbness in arms or legs:     Sudden onset of difficulty speaking or slurred speech:    Temporary loss of vision in one eye:     Problems with dizziness:         Gastrointestinal    Blood in stool:     Vomited blood:         Genitourinary    Burning when urinating:     Blood in urine:        Psychiatric    Major depression:         Hematologic    Bleeding problems:    Problems with blood clotting too easily:        Skin    Rashes or ulcers:        Constitutional    Fever or chills:     PHYSICAL EXAM:   Vitals:   02/06/23 1059  BP: 128/80  Pulse: 88  Resp: 20  Temp: 98 F (36.7 C)  SpO2: 98%  Weight: (!) 338 lb (153.3 kg)  Height: 6\' 5"  (1.956 m)    GENERAL: The patient is a well-nourished male, in no acute distress. The vital signs are documented above. CARDIAC: There is a regular rate and rhythm.  VASCULAR: The proximal fistula is pulsatile. PULMONARY: There is good air exchange bilaterally without wheezing or rales. MUSCULOSKELETAL: There are no major deformities or cyanosis. NEUROLOGIC: No focal weakness or paresthesias are detected. SKIN: There are no ulcers or rashes noted. PSYCHIATRIC: The patient has a normal affect.  DATA:    FISTULOGRAM: I reviewed his fistulogram that was done on 10/21/2022.  He had a long segment stenosis in the proximal fistula which was successfully addressed with an 8 mm x 60 mm loop tonics balloon.  This was done via a radial approach.  He had an excellent  result with no residual stenosis.  DUPLEX AV FISTULA: I have independently interpreted his duplex of his right arm fistula today.  He is noted to have significant stenosis in the mid upper arm with a peak systolic velocity of 618 cm/s.  Of note, the patient just had dialysis prior to coming here in the area of stenosis was at one of the access sites today.    Waverly Ferrari [facility] [phone]

## 2023-02-07 ENCOUNTER — Other Ambulatory Visit: Payer: Self-pay

## 2023-02-07 DIAGNOSIS — N186 End stage renal disease: Secondary | ICD-10-CM

## 2023-02-07 DIAGNOSIS — T82590A Other mechanical complication of surgically created arteriovenous fistula, initial encounter: Secondary | ICD-10-CM

## 2023-02-07 MED ORDER — SODIUM CHLORIDE 0.9 % IV SOLN: 250.0000 mL | INTRAVENOUS | Status: DC | PRN

## 2023-02-07 MED ORDER — SODIUM CHLORIDE 0.9% FLUSH: 3.0000 mL | Freq: Two times a day (BID) | INTRAVENOUS | Status: DC

## 2023-02-07 MED ORDER — SODIUM CHLORIDE 0.9% FLUSH: 3.0000 mL | INTRAVENOUS | Status: DC | PRN

## 2023-02-17 ENCOUNTER — Encounter (HOSPITAL_COMMUNITY)
Admission: RE | Disposition: A | Discharge status: Home / Self Care | Payer: Self-pay | Source: Ambulatory Visit | Attending: Vascular Surgery

## 2023-02-17 ENCOUNTER — Ambulatory Visit (HOSPITAL_COMMUNITY)
Admission: RE | Admit: 2023-02-17 | Discharge: 2023-02-17 | Disposition: A | Discharge status: Home / Self Care | Payer: BC Managed Care – PPO | Source: Ambulatory Visit | Attending: Vascular Surgery | Admitting: Vascular Surgery

## 2023-02-17 DIAGNOSIS — Y841 Kidney dialysis as the cause of abnormal reaction of the patient, or of later complication, without mention of misadventure at the time of the procedure: Secondary | ICD-10-CM | POA: Insufficient documentation

## 2023-02-17 DIAGNOSIS — T82898A Other specified complication of vascular prosthetic devices, implants and grafts, initial encounter: Secondary | ICD-10-CM

## 2023-02-17 DIAGNOSIS — Z992 Dependence on renal dialysis: Secondary | ICD-10-CM | POA: Insufficient documentation

## 2023-02-17 DIAGNOSIS — Z794 Long term (current) use of insulin: Secondary | ICD-10-CM | POA: Diagnosis not present

## 2023-02-17 DIAGNOSIS — Z7901 Long term (current) use of anticoagulants: Secondary | ICD-10-CM | POA: Insufficient documentation

## 2023-02-17 DIAGNOSIS — T82858A Stenosis of vascular prosthetic devices, implants and grafts, initial encounter: Secondary | ICD-10-CM | POA: Diagnosis present

## 2023-02-17 DIAGNOSIS — E1122 Type 2 diabetes mellitus with diabetic chronic kidney disease: Secondary | ICD-10-CM | POA: Diagnosis not present

## 2023-02-17 DIAGNOSIS — I132 Hypertensive heart and chronic kidney disease with heart failure and with stage 5 chronic kidney disease, or end stage renal disease: Secondary | ICD-10-CM | POA: Diagnosis not present

## 2023-02-17 DIAGNOSIS — N186 End stage renal disease: Secondary | ICD-10-CM | POA: Insufficient documentation

## 2023-02-17 DIAGNOSIS — I509 Heart failure, unspecified: Secondary | ICD-10-CM | POA: Insufficient documentation

## 2023-02-17 DIAGNOSIS — Z87891 Personal history of nicotine dependence: Secondary | ICD-10-CM | POA: Insufficient documentation

## 2023-02-17 DIAGNOSIS — T82590A Other mechanical complication of surgically created arteriovenous fistula, initial encounter: Secondary | ICD-10-CM

## 2023-02-17 HISTORY — PX: A/V FISTULAGRAM: CATH118298

## 2023-02-17 HISTORY — PX: PERIPHERAL VASCULAR BALLOON ANGIOPLASTY: CATH118281

## 2023-02-17 LAB — GLUCOSE, CAPILLARY: Glucose-Capillary: 178 mg/dL — ABNORMAL HIGH (ref 70–99)

## 2023-02-17 LAB — POCT I-STAT, CHEM 8
BUN: 106 mg/dL — ABNORMAL HIGH (ref 6–20)
Calcium, Ion: 1.19 mmol/L (ref 1.15–1.40)
Chloride: 91 mmol/L — ABNORMAL LOW (ref 98–111)
Creatinine, Ser: 5.9 mg/dL — ABNORMAL HIGH (ref 0.61–1.24)
Glucose, Bld: 198 mg/dL — ABNORMAL HIGH (ref 70–99)
HCT: 31 % — ABNORMAL LOW (ref 39.0–52.0)
Hemoglobin: 10.5 g/dL — ABNORMAL LOW (ref 13.0–17.0)
Potassium: 3.1 mmol/L — ABNORMAL LOW (ref 3.5–5.1)
Sodium: 135 mmol/L (ref 135–145)
TCO2: 31 mmol/L (ref 22–32)

## 2023-02-17 SURGERY — A/V FISTULAGRAM
Anesthesia: LOCAL | Laterality: Right

## 2023-02-17 MED ORDER — LIDOCAINE HCL (PF) 1 % IJ SOLN
INTRAMUSCULAR | Status: AC
  Filled 2023-02-17: qty 30

## 2023-02-17 MED ORDER — MIDAZOLAM HCL 2 MG/2ML IJ SOLN
INTRAMUSCULAR | Status: AC
  Filled 2023-02-17: qty 2

## 2023-02-17 MED ORDER — HEPARIN SODIUM (PORCINE) 1000 UNIT/ML IJ SOLN
INTRAMUSCULAR | Status: DC | PRN
  Administered 2023-02-17: 3000 [IU] via INTRAVENOUS

## 2023-02-17 MED ORDER — HEPARIN SODIUM (PORCINE) 1000 UNIT/ML IJ SOLN
INTRAMUSCULAR | Status: AC
  Filled 2023-02-17: qty 10

## 2023-02-17 MED ORDER — FENTANYL CITRATE (PF) 100 MCG/2ML IJ SOLN
INTRAMUSCULAR | Status: DC | PRN
  Administered 2023-02-17: 50 ug via INTRAVENOUS

## 2023-02-17 MED ORDER — IODIXANOL 320 MG/ML IV SOLN
INTRAVENOUS | Status: DC | PRN
  Administered 2023-02-17: 40 mL

## 2023-02-17 MED ORDER — FENTANYL CITRATE (PF) 100 MCG/2ML IJ SOLN
INTRAMUSCULAR | Status: AC
  Filled 2023-02-17: qty 2

## 2023-02-17 MED ORDER — HEPARIN (PORCINE) IN NACL 1000-0.9 UT/500ML-% IV SOLN
INTRAVENOUS | Status: DC | PRN
  Administered 2023-02-17: 500 mL

## 2023-02-17 MED ORDER — MIDAZOLAM HCL 2 MG/2ML IJ SOLN
INTRAMUSCULAR | Status: DC | PRN
  Administered 2023-02-17: 1 mg via INTRAVENOUS

## 2023-02-17 MED ORDER — LIDOCAINE HCL (PF) 1 % IJ SOLN
INTRAMUSCULAR | Status: DC | PRN
  Administered 2023-02-17: 3 mL

## 2023-02-17 SURGICAL SUPPLY — 15 items
BAG SNAP BAND KOVER 36X36 (MISCELLANEOUS) IMPLANT
BALLN LUTONIX AV 8X60X75 (BALLOONS) ×2
BALLN MUSTANG 7X60X75 (BALLOONS) ×1
BALLOON LUTONIX AV 8X60X75 (BALLOONS) IMPLANT
BALLOON MUSTANG 7X60X75 (BALLOONS) IMPLANT
COVER DOME SNAP 22 D (MISCELLANEOUS) ×1 IMPLANT
KIT ENCORE 26 ADVANTAGE (KITS) IMPLANT
KIT MICROPUNCTURE NIT STIFF (SHEATH) IMPLANT
KIT SYRINGE INJ CVI SPIKEX1 (MISCELLANEOUS) IMPLANT
SET ATX-X65L (MISCELLANEOUS) IMPLANT
SHEATH PINNACLE R/O II 6F 4CM (SHEATH) IMPLANT
SHEATH PROBE COVER 6X72 (BAG) ×1 IMPLANT
TRAY PV CATH (CUSTOM PROCEDURE TRAY) ×1 IMPLANT
TUBING CIL FLEX 10 FLL-RA (TUBING) ×1 IMPLANT
WIRE BENTSON .035X145CM (WIRE) IMPLANT

## 2023-02-17 NOTE — Op Note (Signed)
PATIENT: Erek Foth      MRN: [id number] DOB: 1966-01-13    DATE OF PROCEDURE: 02/17/2023  INDICATIONS:    Ashot Carignan is a 57 y.o. male who had presented with sluggish flow in his right upper arm fistula.  He had undergone previous intervention in May of this year.  Duplex scan showed a stenosis in the central portion of the fistula.  He presents for intervention.  He dialyzes on Tuesdays Thursdays and Fridays.  His Eliquis was held prior to the procedure.  PROCEDURE:    Conscious sedation Ultrasound-guided access to right upper arm fistula Fistulogram right upper arm fistula Drug-coated balloon angioplasty of right upper arm fistula  SURGEON: Di Kindle. Edilia Bo, MD, FACS  EBL: Minimal  TECHNIQUE: The patient was brought to the peripheral vascular lab and was sedated. The period of conscious sedation was 40 minutes.  During that time period, I was present face-to-face 100% of the time.  The patient was administered 1 mg of Versed and 50 mcg of fentanyl. The patient's heart rate, blood pressure, and oxygen saturation were monitored by the nurse continuously during the procedure.  The right upper extremity was prepped and draped in usual sterile fashion.  Under ultrasound guidance, after the skin was anesthetized, I cannulated the proximal fistula with a micropuncture needle and a micropuncture sheath was introduced over the wire.  A real-time image was obtained and sent to the server.  A fistulogram was obtained to evaluate the fistula to include the central veins.  I elected to proceed with angioplasty of the stenosis noted in the central portion of the fistula and proximal fistula.  There were 2 areas of significant stenosis and right irregularity in the mid fistula and a long segment of narrowing in the proximal fistula.  The micropuncture sheath was exchanged for a short 6 [place] sheath.  The patient received 3000 units of IV heparin.  Initially addressed these with a 7 mm x 6 cm  balloon.  With the balloon inflated a reflux shot was obtained to evaluate the anastomosis which was widely patent.  I then went back with a 8 mm x 6 cm Lutonix drug-coated balloon which was positioned across the central stenoses and inflated to 8 atm for 3 minutes.  The proximal segment was then addressed with a Lutonix 8 mm x 6 cm balloon which was inflated to 8 atm for 3 minutes.  Completion film showed a good result.  There was an excellent thrill at the fistula at the completion of the procedure.  The cannulation site was closed with a 4-0 Monocryl.    FINDINGS:   The right upper arm fistula is widely patent. There were 2 areas of stenosis and irregularity in the central portion of the fistula which was addressed with drug-coated balloon angioplasty as described above. There was a long segment area of narrowing in the proximal fistula which was also addressed with drug-coated balloon angioplasty as described above.  Waverly Ferrari, MD, FACS [facility]  DATE OF DICTATION:   02/17/2023

## 2023-02-17 NOTE — Interval H&P Note (Signed)
History and Physical Interval Note:  02/17/2023 9:04 AM  [name]  has presented today for surgery, with the diagnosis of instage renal.  The various methods of treatment have been discussed with the patient and family. After consideration of risks, benefits and other options for treatment, the patient has consented to  Procedure(s): A/V Fistulagram (Right) as a surgical intervention.  The patient's history has been reviewed, patient examined, no change in status, stable for surgery.  I have reviewed the patient's chart and labs.  Questions were answered to the patient's satisfaction.     Sean Valentine

## 2023-02-18 ENCOUNTER — Encounter (HOSPITAL_COMMUNITY): Payer: Self-pay | Admitting: Vascular Surgery

## 2023-03-12 ENCOUNTER — Ambulatory Visit: Payer: BC Managed Care – PPO

## 2023-03-12 ENCOUNTER — Encounter (HOSPITAL_COMMUNITY): Payer: BC Managed Care – PPO

## 2023-03-12 NOTE — Progress Notes (Signed)
consistent with Grade I diastolic dysfunction (impaired relaxation).  Right Ventricle: The right ventricular size is normal. No increase in right ventricular wall thickness. Right ventricular systolic function is normal.  Left Atrium: Left atrial size was moderately dilated.  Right Atrium: Right atrial size was normal in size.  Pericardium: There is no evidence of pericardial effusion.  Mitral Valve: The mitral valve is  normal in structure. No evidence of mitral valve regurgitation. No evidence of mitral valve stenosis.  Tricuspid Valve: The tricuspid valve is normal in structure. Tricuspid valve regurgitation is not demonstrated. No evidence of tricuspid stenosis.  Aortic Valve: The aortic valve is normal in structure. Aortic valve regurgitation is not visualized. No aortic stenosis is present.  Pulmonic Valve: The pulmonic valve was normal in structure. Pulmonic valve regurgitation is not visualized. No evidence of pulmonic stenosis.  Aorta: The aortic root is normal in size and structure.  Venous: The inferior vena cava is normal in size with greater than 50% respiratory variability, suggesting right atrial pressure of 3 mmHg.  IAS/Shunts: No atrial level shunt detected by color flow Doppler.   LEFT VENTRICLE PLAX 2D LVIDd:         5.30 cm   Diastology LVIDs:         3.00 cm   LV e' medial:    5.77 cm/s LV PW:         1.40 cm   LV E/e' medial:  19.9 LV IVS:        1.50 cm   LV e' lateral:   6.96 cm/s LVOT diam:     2.30 cm   LV E/e' lateral: 16.5 LV SV:         143 LV SV Index:   48 LVOT Area:     4.15 cm   RIGHT VENTRICLE RV Basal diam:  2.60 cm RV S prime:     18.80 cm/s TAPSE (M-mode): 3.3 cm  LEFT ATRIUM             Index        RIGHT ATRIUM           Index LA diam:        4.80 cm 1.62 cm/m   RA Area:     15.10 cm LA Vol (A2C):   84.2 ml 28.50 ml/m  RA Volume:   34.70 ml  11.75 ml/m LA Vol (A4C):   93.5 ml 31.65 ml/m LA Biplane Vol: 89.1 ml 30.16 ml/m AORTIC VALVE LVOT Vmax:   137.00 cm/s LVOT Vmean:  102.000 cm/s LVOT VTI:    0.344 m  AORTA Ao Root diam: 3.70 cm Ao Asc diam:  3.50 cm  MITRAL VALVE MV Area (PHT): 2.87 cm     SHUNTS MV Decel Time: 264 msec     Systemic VTI:  0.34 m MV E velocity: 115.00 cm/s  Systemic Diam: 2.30 cm MV A velocity: 98.30 cm/s MV E/A ratio:  1.17  Belva Crome MD Electronically signed by Belva Crome MD Signature Date/Time:  10/02/2021/4:53:58 PM    Final   TEE  ECHO TEE 11/03/2020  Narrative TRANSESOPHOGEAL ECHO REPORT    Patient Name:   Sean Valentine Date of Exam: 11/03/2020 Medical Rec #:  [id number]   Height:       77.0 in Accession #:    [id number]  Weight:       380.0 lb Date of Birth:  1966-04-22   BSA:          2.938 m  Cardiology Office Note:  .   Date:  03/12/2023  ID:  Sean Valentine, DOB 21-Nov-1965, MRN [id number] PCP: Gordan Payment., MD  [facility] Providers Cardiologist:  None { Click to update primary MD,subspecialty MD or APP then REFRESH:1}   History of Present Illness: .   Sean Valentine is a 57 y.o. male with a past medical history of congestive heart failure, hypertension, NASH, DM2, ESRD on dialysis, obesity, OSA, hemochromatosis.  06/15/2022 monitor average heart rate 84 bpm, first-degree AV block was present, infrequent SVE and infrequent VE 10/02/2021 echo EF 60 to 65%, moderate LVH, grade 1 DD, LA moderately dilated 09/20/2020 MPI revealed no reversible ischemia or infarction  Most recently evaluated by Dr. Eual Fines on 04/22/2022, at this time he had recently starting dialysis and was trying to adjust to this.  A monitor was arranged for palpitations, this revealed an average heart rate 84 bpm, first-degree AV block was present, infrequent SVE and infrequent VE.  ROS: ROS   Studies Reviewed: .      Cardiac Studies & Procedures     STRESS TESTS  MYOCARDIAL PERFUSION IMAGING 09/20/2020   ECHOCARDIOGRAM  ECHOCARDIOGRAM COMPLETE 10/02/2021  Narrative ECHOCARDIOGRAM REPORT    Patient Name:   Sean Valentine Date of Exam: 10/02/2021 Medical Rec #:  [id number]   Height:       77.0 in Accession #:    [id number]  Weight:       384.8 lb Date of Birth:  [birth year]   BSA:          2.954 m Patient Age:    56 years    BP:           174/90 mmHg Patient Gender: M           HR:           73 bpm. Exam Location:  [place]  Procedure: 2D Echo, Cardiac Doppler and Color Doppler  Indications:    Chronic diastolic congestive heart failure (HCC) [I50.32 (ICD-10-CM)]; Benign hypertension with CKD (chronic kidney disease) stage III (HCC) [I12.9, N18.30 (ICD-10-CM)]; History of pulmonary embolism [Z86.711 (ICD-10-CM)]  History:        Patient has prior history of Echocardiogram examinations,  most recent 11/01/2020. CHF; Risk Factors:Hypertension and Dyslipidemia.  Sonographer:    Margreta Journey RDCS Referring Phys: [id number] Georgeanna Lea   Sonographer Comments: Image acquisition challenging due to patient body habitus and Image acquisition challenging due to respiratory motion. IMPRESSIONS   1. Left ventricular ejection fraction, by estimation, is 60 to 65%. The left ventricle has normal function. The left ventricle has no regional wall motion abnormalities. There is moderate left ventricular hypertrophy. Left ventricular diastolic parameters are consistent with Grade I diastolic dysfunction (impaired relaxation). 2. Right ventricular systolic function is normal. The right ventricular size is normal. 3. Left atrial size was moderately dilated. 4. The mitral valve is normal in structure. No evidence of mitral valve regurgitation. No evidence of mitral stenosis. 5. The aortic valve is normal in structure. Aortic valve regurgitation is not visualized. No aortic stenosis is present. 6. The inferior vena cava is normal in size with greater than 50% respiratory variability, suggesting right atrial pressure of 3 mmHg.  FINDINGS Left Ventricle: Left ventricular ejection fraction, by estimation, is 60 to 65%. The left ventricle has normal function. The left ventricle has no regional wall motion abnormalities. The left ventricular internal cavity size was normal in size. There is moderate left ventricular hypertrophy. Left ventricular diastolic parameters are  Cardiology Office Note:  .   Date:  03/12/2023  ID:  Sean Valentine, DOB 21-Nov-1965, MRN [id number] PCP: Gordan Payment., MD  [facility] Providers Cardiologist:  None { Click to update primary MD,subspecialty MD or APP then REFRESH:1}   History of Present Illness: .   Sean Valentine is a 57 y.o. male with a past medical history of congestive heart failure, hypertension, NASH, DM2, ESRD on dialysis, obesity, OSA, hemochromatosis.  06/15/2022 monitor average heart rate 84 bpm, first-degree AV block was present, infrequent SVE and infrequent VE 10/02/2021 echo EF 60 to 65%, moderate LVH, grade 1 DD, LA moderately dilated 09/20/2020 MPI revealed no reversible ischemia or infarction  Most recently evaluated by Dr. Eual Fines on 04/22/2022, at this time he had recently starting dialysis and was trying to adjust to this.  A monitor was arranged for palpitations, this revealed an average heart rate 84 bpm, first-degree AV block was present, infrequent SVE and infrequent VE.  ROS: ROS   Studies Reviewed: .      Cardiac Studies & Procedures     STRESS TESTS  MYOCARDIAL PERFUSION IMAGING 09/20/2020   ECHOCARDIOGRAM  ECHOCARDIOGRAM COMPLETE 10/02/2021  Narrative ECHOCARDIOGRAM REPORT    Patient Name:   Sean Valentine Date of Exam: 10/02/2021 Medical Rec #:  [id number]   Height:       77.0 in Accession #:    [id number]  Weight:       384.8 lb Date of Birth:  [birth year]   BSA:          2.954 m Patient Age:    56 years    BP:           174/90 mmHg Patient Gender: M           HR:           73 bpm. Exam Location:  [place]  Procedure: 2D Echo, Cardiac Doppler and Color Doppler  Indications:    Chronic diastolic congestive heart failure (HCC) [I50.32 (ICD-10-CM)]; Benign hypertension with CKD (chronic kidney disease) stage III (HCC) [I12.9, N18.30 (ICD-10-CM)]; History of pulmonary embolism [Z86.711 (ICD-10-CM)]  History:        Patient has prior history of Echocardiogram examinations,  most recent 11/01/2020. CHF; Risk Factors:Hypertension and Dyslipidemia.  Sonographer:    Margreta Journey RDCS Referring Phys: [id number] Georgeanna Lea   Sonographer Comments: Image acquisition challenging due to patient body habitus and Image acquisition challenging due to respiratory motion. IMPRESSIONS   1. Left ventricular ejection fraction, by estimation, is 60 to 65%. The left ventricle has normal function. The left ventricle has no regional wall motion abnormalities. There is moderate left ventricular hypertrophy. Left ventricular diastolic parameters are consistent with Grade I diastolic dysfunction (impaired relaxation). 2. Right ventricular systolic function is normal. The right ventricular size is normal. 3. Left atrial size was moderately dilated. 4. The mitral valve is normal in structure. No evidence of mitral valve regurgitation. No evidence of mitral stenosis. 5. The aortic valve is normal in structure. Aortic valve regurgitation is not visualized. No aortic stenosis is present. 6. The inferior vena cava is normal in size with greater than 50% respiratory variability, suggesting right atrial pressure of 3 mmHg.  FINDINGS Left Ventricle: Left ventricular ejection fraction, by estimation, is 60 to 65%. The left ventricle has normal function. The left ventricle has no regional wall motion abnormalities. The left ventricular internal cavity size was normal in size. There is moderate left ventricular hypertrophy. Left ventricular diastolic parameters are  Cardiology Office Note:  .   Date:  03/12/2023  ID:  Sean Valentine, DOB 21-Nov-1965, MRN [id number] PCP: Gordan Payment., MD  [facility] Providers Cardiologist:  None { Click to update primary MD,subspecialty MD or APP then REFRESH:1}   History of Present Illness: .   Sean Valentine is a 57 y.o. male with a past medical history of congestive heart failure, hypertension, NASH, DM2, ESRD on dialysis, obesity, OSA, hemochromatosis.  06/15/2022 monitor average heart rate 84 bpm, first-degree AV block was present, infrequent SVE and infrequent VE 10/02/2021 echo EF 60 to 65%, moderate LVH, grade 1 DD, LA moderately dilated 09/20/2020 MPI revealed no reversible ischemia or infarction  Most recently evaluated by Dr. Eual Fines on 04/22/2022, at this time he had recently starting dialysis and was trying to adjust to this.  A monitor was arranged for palpitations, this revealed an average heart rate 84 bpm, first-degree AV block was present, infrequent SVE and infrequent VE.  ROS: ROS   Studies Reviewed: .      Cardiac Studies & Procedures     STRESS TESTS  MYOCARDIAL PERFUSION IMAGING 09/20/2020   ECHOCARDIOGRAM  ECHOCARDIOGRAM COMPLETE 10/02/2021  Narrative ECHOCARDIOGRAM REPORT    Patient Name:   Sean Valentine Date of Exam: 10/02/2021 Medical Rec #:  [id number]   Height:       77.0 in Accession #:    [id number]  Weight:       384.8 lb Date of Birth:  [birth year]   BSA:          2.954 m Patient Age:    56 years    BP:           174/90 mmHg Patient Gender: M           HR:           73 bpm. Exam Location:  [place]  Procedure: 2D Echo, Cardiac Doppler and Color Doppler  Indications:    Chronic diastolic congestive heart failure (HCC) [I50.32 (ICD-10-CM)]; Benign hypertension with CKD (chronic kidney disease) stage III (HCC) [I12.9, N18.30 (ICD-10-CM)]; History of pulmonary embolism [Z86.711 (ICD-10-CM)]  History:        Patient has prior history of Echocardiogram examinations,  most recent 11/01/2020. CHF; Risk Factors:Hypertension and Dyslipidemia.  Sonographer:    Margreta Journey RDCS Referring Phys: [id number] Georgeanna Lea   Sonographer Comments: Image acquisition challenging due to patient body habitus and Image acquisition challenging due to respiratory motion. IMPRESSIONS   1. Left ventricular ejection fraction, by estimation, is 60 to 65%. The left ventricle has normal function. The left ventricle has no regional wall motion abnormalities. There is moderate left ventricular hypertrophy. Left ventricular diastolic parameters are consistent with Grade I diastolic dysfunction (impaired relaxation). 2. Right ventricular systolic function is normal. The right ventricular size is normal. 3. Left atrial size was moderately dilated. 4. The mitral valve is normal in structure. No evidence of mitral valve regurgitation. No evidence of mitral stenosis. 5. The aortic valve is normal in structure. Aortic valve regurgitation is not visualized. No aortic stenosis is present. 6. The inferior vena cava is normal in size with greater than 50% respiratory variability, suggesting right atrial pressure of 3 mmHg.  FINDINGS Left Ventricle: Left ventricular ejection fraction, by estimation, is 60 to 65%. The left ventricle has normal function. The left ventricle has no regional wall motion abnormalities. The left ventricular internal cavity size was normal in size. There is moderate left ventricular hypertrophy. Left ventricular diastolic parameters are  consistent with Grade I diastolic dysfunction (impaired relaxation).  Right Ventricle: The right ventricular size is normal. No increase in right ventricular wall thickness. Right ventricular systolic function is normal.  Left Atrium: Left atrial size was moderately dilated.  Right Atrium: Right atrial size was normal in size.  Pericardium: There is no evidence of pericardial effusion.  Mitral Valve: The mitral valve is  normal in structure. No evidence of mitral valve regurgitation. No evidence of mitral valve stenosis.  Tricuspid Valve: The tricuspid valve is normal in structure. Tricuspid valve regurgitation is not demonstrated. No evidence of tricuspid stenosis.  Aortic Valve: The aortic valve is normal in structure. Aortic valve regurgitation is not visualized. No aortic stenosis is present.  Pulmonic Valve: The pulmonic valve was normal in structure. Pulmonic valve regurgitation is not visualized. No evidence of pulmonic stenosis.  Aorta: The aortic root is normal in size and structure.  Venous: The inferior vena cava is normal in size with greater than 50% respiratory variability, suggesting right atrial pressure of 3 mmHg.  IAS/Shunts: No atrial level shunt detected by color flow Doppler.   LEFT VENTRICLE PLAX 2D LVIDd:         5.30 cm   Diastology LVIDs:         3.00 cm   LV e' medial:    5.77 cm/s LV PW:         1.40 cm   LV E/e' medial:  19.9 LV IVS:        1.50 cm   LV e' lateral:   6.96 cm/s LVOT diam:     2.30 cm   LV E/e' lateral: 16.5 LV SV:         143 LV SV Index:   48 LVOT Area:     4.15 cm   RIGHT VENTRICLE RV Basal diam:  2.60 cm RV S prime:     18.80 cm/s TAPSE (M-mode): 3.3 cm  LEFT ATRIUM             Index        RIGHT ATRIUM           Index LA diam:        4.80 cm 1.62 cm/m   RA Area:     15.10 cm LA Vol (A2C):   84.2 ml 28.50 ml/m  RA Volume:   34.70 ml  11.75 ml/m LA Vol (A4C):   93.5 ml 31.65 ml/m LA Biplane Vol: 89.1 ml 30.16 ml/m AORTIC VALVE LVOT Vmax:   137.00 cm/s LVOT Vmean:  102.000 cm/s LVOT VTI:    0.344 m  AORTA Ao Root diam: 3.70 cm Ao Asc diam:  3.50 cm  MITRAL VALVE MV Area (PHT): 2.87 cm     SHUNTS MV Decel Time: 264 msec     Systemic VTI:  0.34 m MV E velocity: 115.00 cm/s  Systemic Diam: 2.30 cm MV A velocity: 98.30 cm/s MV E/A ratio:  1.17  Belva Crome MD Electronically signed by Belva Crome MD Signature Date/Time:  10/02/2021/4:53:58 PM    Final   TEE  ECHO TEE 11/03/2020  Narrative TRANSESOPHOGEAL ECHO REPORT    Patient Name:   Sean Valentine Date of Exam: 11/03/2020 Medical Rec #:  [id number]   Height:       77.0 in Accession #:    [id number]  Weight:       380.0 lb Date of Birth:  1966-04-22   BSA:          2.938 m

## 2023-03-13 ENCOUNTER — Ambulatory Visit: Payer: BC Managed Care – PPO | Attending: Cardiology | Admitting: Cardiology

## 2023-03-13 ENCOUNTER — Encounter: Payer: Self-pay | Admitting: Cardiology

## 2023-03-13 VITALS — BP 124/60 | HR 87 | Ht 77.0 in | Wt 337.4 lb

## 2023-03-13 DIAGNOSIS — N186 End stage renal disease: Secondary | ICD-10-CM | POA: Diagnosis present

## 2023-03-13 DIAGNOSIS — E1122 Type 2 diabetes mellitus with diabetic chronic kidney disease: Secondary | ICD-10-CM

## 2023-03-13 DIAGNOSIS — I5032 Chronic diastolic (congestive) heart failure: Secondary | ICD-10-CM

## 2023-03-13 DIAGNOSIS — Z794 Long term (current) use of insulin: Secondary | ICD-10-CM | POA: Diagnosis present

## 2023-03-13 DIAGNOSIS — G4733 Obstructive sleep apnea (adult) (pediatric): Secondary | ICD-10-CM | POA: Diagnosis not present

## 2023-03-13 DIAGNOSIS — N1832 Chronic kidney disease, stage 3b: Secondary | ICD-10-CM

## 2023-03-13 DIAGNOSIS — I2699 Other pulmonary embolism without acute cor pulmonale: Secondary | ICD-10-CM | POA: Diagnosis present

## 2023-03-13 DIAGNOSIS — Z7901 Long term (current) use of anticoagulants: Secondary | ICD-10-CM | POA: Diagnosis present

## 2023-03-13 DIAGNOSIS — I10 Essential (primary) hypertension: Secondary | ICD-10-CM | POA: Diagnosis not present

## 2023-03-13 NOTE — Patient Instructions (Signed)
Medication Instructions:  Your physician recommends that you continue on your current medications as directed. Please refer to the Current Medication list given to you today.  *If you need a refill on your cardiac medications before your next appointment, please call your pharmacy*   Lab Work: NONE If you have labs (blood work) drawn today and your tests are completely normal, you will receive your results only by: [place] (if you have MyChart) OR A paper copy in the mail If you have any lab test that is abnormal or we need to change your treatment, we will call you to review the results.   Testing/Procedures: NONE   Follow-Up: At [facility], you and your health needs are our priority.  As part of our continuing mission to provide you with exceptional heart care, we have created designated Provider Care Teams.  These Care Teams include your primary Cardiologist (physician) and Advanced Practice Providers (APPs -  Physician Assistants and Nurse Practitioners) who all work together to provide you with the care you need, when you need it.  We recommend signing up for the patient portal called "MyChart".  Sign up information is provided on this After Visit Summary.  MyChart is used to connect with patients for Virtual Visits (Telemedicine).  Patients are able to view lab/test results, encounter notes, upcoming appointments, etc.  Non-urgent messages can be sent to your provider as well.   To learn more about what you can do with MyChart, go to [email or web address].    Your next appointment:   1 year(s)  Provider:   Jenne Campus, MD    Other Instructions

## 2023-06-19 ENCOUNTER — Other Ambulatory Visit: Payer: Self-pay | Admitting: Oncology

## 2023-06-19 ENCOUNTER — Inpatient Hospital Stay: Payer: Medicare Other | Attending: Oncology

## 2023-06-19 ENCOUNTER — Other Ambulatory Visit: Payer: BC Managed Care – PPO

## 2023-06-19 DIAGNOSIS — N186 End stage renal disease: Secondary | ICD-10-CM | POA: Insufficient documentation

## 2023-06-19 DIAGNOSIS — E1122 Type 2 diabetes mellitus with diabetic chronic kidney disease: Secondary | ICD-10-CM | POA: Diagnosis not present

## 2023-06-19 LAB — CBC WITH DIFFERENTIAL (CANCER CENTER ONLY)
Abs Immature Granulocytes: 0.11 10*3/uL — ABNORMAL HIGH (ref 0.00–0.07)
Basophils Absolute: 0.1 10*3/uL (ref 0.0–0.1)
Basophils Relative: 1 %
Eosinophils Absolute: 0.2 10*3/uL (ref 0.0–0.5)
Eosinophils Relative: 2 %
HCT: 34.5 % — ABNORMAL LOW (ref 39.0–52.0)
Hemoglobin: 11.7 g/dL — ABNORMAL LOW (ref 13.0–17.0)
Immature Granulocytes: 1 %
Lymphocytes Relative: 18 %
Lymphs Abs: 2.1 10*3/uL (ref 0.7–4.0)
MCH: 33.6 pg (ref 26.0–34.0)
MCHC: 33.9 g/dL (ref 30.0–36.0)
MCV: 99.1 fL (ref 80.0–100.0)
Monocytes Absolute: 0.8 10*3/uL (ref 0.1–1.0)
Monocytes Relative: 7 %
Neutro Abs: 8.1 10*3/uL — ABNORMAL HIGH (ref 1.7–7.7)
Neutrophils Relative %: 71 %
Platelet Count: 271 10*3/uL (ref 150–400)
RBC: 3.48 MIL/uL — ABNORMAL LOW (ref 4.22–5.81)
RDW: 17.7 % — ABNORMAL HIGH (ref 11.5–15.5)
WBC Count: 11.3 10*3/uL — ABNORMAL HIGH (ref 4.0–10.5)
nRBC: 0 % (ref 0.0–0.2)
nRBC: 0 /100{WBCs}

## 2023-06-19 LAB — CMP (CANCER CENTER ONLY)
ALT: <5 U/L (ref 0–44)
AST: 9 U/L — ABNORMAL LOW (ref 15–41)
Albumin: 4.2 g/dL (ref 3.5–5.0)
Alkaline Phosphatase: 144 U/L — ABNORMAL HIGH (ref 38–126)
Anion gap: 18 — ABNORMAL HIGH (ref 5–15)
BUN: 55 mg/dL — ABNORMAL HIGH (ref 6–20)
CO2: 28 mmol/L (ref 22–32)
Calcium: 9.9 mg/dL (ref 8.9–10.3)
Chloride: 91 mmol/L — ABNORMAL LOW (ref 98–111)
Creatinine: 4.49 mg/dL — ABNORMAL HIGH (ref 0.61–1.24)
GFR, Estimated: 14 mL/min — ABNORMAL LOW (ref >60)
Glucose, Bld: 538 mg/dL (ref 70–99)
Potassium: 3.7 mmol/L (ref 3.5–5.1)
Sodium: 137 mmol/L (ref 135–145)
Total Bilirubin: 0.3 mg/dL (ref 0.0–1.2)
Total Protein: 7.8 g/dL (ref 6.5–8.1)

## 2023-06-19 LAB — IRON AND TIBC
Iron: 73 ug/dL (ref 45–182)
Saturation Ratios: 25 % (ref 17.9–39.5)
TIBC: 291 ug/dL (ref 250–450)
UIBC: 218 ug/dL

## 2023-06-19 LAB — FERRITIN: Ferritin: 288 ng/mL (ref 24–336)

## 2023-06-19 NOTE — Progress Notes (Signed)
CRITICAL VALUE STICKER  CRITICAL VALUE:  Glucose 538  RECEIVER (on-site recipient of call):  Dyane Dustman RN  DATE & TIME NOTIFIED:   06/19/2023 @ 1452  MESSENGER (representative from lab):  Selena Batten [facility]  MD NOTIFIED:   Dr. Melvyn Neth  TIME OF NOTIFICATION:  1503  RESPONSE:   Per Dr. Melvyn Neth, patient needs to contact his PCP.  Patient notified, states he is already working on that.

## 2023-06-19 NOTE — Progress Notes (Signed)
[facility]  [address] [place],  [place]  [address] [phone]  Clinic Day:  06/21/2023  Referring physician: Gordan Payment., MD   HISTORY OF PRESENT ILLNESS:  The patient is a 58 y.o. 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  Due to his elevated iron parameters, the patient did undergo periodic phlebotomies in the past.  He comes in today to reassess his iron parameters.  Since his last visit, the patient has been doing okay.  He does have end-stage renal disease for which he undergoes hemodialysis 3 times per week.  He also has very brittle diabetes where his blood sugar could be as high as 400-500 despite being compliant with all of his diabetic medications.  He denies having any systemic symptoms or complications related to his underlying hemochromatosis.  PHYSICAL EXAM:  Blood pressure (!) 154/73, pulse 79, temperature 98.7 F (37.1 C), temperature source Oral, resp. rate 18, height 6\' 5"  (1.956 m), weight (!) 343 lb 14.4 oz (156 kg), SpO2 100%. Wt Readings from Last 3 Encounters:  06/20/23 (!) 343 lb 14.4 oz (156 kg)  03/13/23 (!) 337 lb 6.4 oz (153 kg)  02/17/23 (!) 335 lb (152 kg)   Body mass index is 40.78 kg/m. Performance status (ECOG): 1 - Symptomatic but completely ambulatory Physical Exam Constitutional:      Appearance: Normal appearance. He is obese. He is not ill-appearing.  HENT:     Mouth/Throat:     Mouth: Mucous membranes are moist.     Pharynx: Oropharynx is clear. No oropharyngeal exudate or posterior oropharyngeal erythema.  Cardiovascular:     Rate and Rhythm: Normal rate and regular rhythm.     Heart sounds: No murmur heard.    No friction rub. No gallop.  Pulmonary:     Effort: Pulmonary effort is normal. No respiratory distress.     Breath sounds: Normal breath sounds. No wheezing, rhonchi or rales.  Abdominal:     General: Bowel sounds are normal. There is no distension.     Palpations: Abdomen is soft. There  is no mass.     Tenderness: There is no abdominal tenderness.  Musculoskeletal:        General: No swelling.     Right lower leg: No edema.     Left lower leg: No edema.  Lymphadenopathy:     Cervical: No cervical adenopathy.     Upper Body:     Right upper body: No supraclavicular or axillary adenopathy.     Left upper body: No supraclavicular or axillary adenopathy.     Lower Body: No right inguinal adenopathy. No left inguinal adenopathy.  Skin:    General: Skin is warm.     Coloration: Skin is not jaundiced.     Findings: No lesion or rash.  Neurological:     General: No focal deficit present.     Mental Status: He is alert and oriented to person, place, and time. Mental status is at baseline.  Psychiatric:        Mood and Affect: Mood normal.        Behavior: Behavior normal.        Thought Content: Thought content normal.     LABS:      Latest Ref Rng & Units 06/19/2023    1:23 PM 02/17/2023    7:20 AM 10/21/2022    5:31 AM  CBC  WBC 4.0 - 10.5 K/uL 11.3     Hemoglobin 13.0 - 17.0 g/dL 11.7  10.5  12.2   Hematocrit 39.0 - 52.0 % 34.5  31.0  36.0   Platelets 150 - 400 K/uL 271         Latest Ref Rng & Units 06/19/2023    1:23 PM 02/17/2023    7:20 AM 10/21/2022    5:31 AM  CMP  Glucose 70 - 99 mg/dL 409  811  95   BUN 6 - 20 mg/dL 55  914  78   Creatinine 0.61 - 1.24 mg/dL 7.82  9.56  2.13   Sodium 135 - 145 mmol/L 137  135  138   Potassium 3.5 - 5.1 mmol/L 3.7  3.1  3.6   Chloride 98 - 111 mmol/L 91  91  100   CO2 22 - 32 mmol/L 28     Calcium 8.9 - 10.3 mg/dL 9.9     Total Protein 6.5 - 8.1 g/dL 7.8     Total Bilirubin 0.0 - 1.2 mg/dL 0.3     Alkaline Phos 38 - 126 U/L 144     AST 15 - 41 U/L 9     ALT 0 - 44 U/L <5       Latest Reference Range & Units 06/19/23 13:23  Iron 45 - 182 ug/dL 73  UIBC ug/dL 086  TIBC 578 - 469 ug/dL 629  Saturation Ratios 17.9 - 39.5 % 25  Ferritin 24 - 336 ng/mL 288    ASSESSMENT & PLAN:  Assessment/Plan:  A 58 y.o.  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  Although his ferritin is greater than 100, his other iron parameters are essentially normal.  As he does not have the most aggressive form of hemochromatosis, he will not be phlebotomized today.  In clinic today, I made sure the patient understood that his end-stage liver disease and diabetes are the major health issues that will impact his morbidity/mortality over these next few years.  I want him to focus primarily on getting both of these diseases in line as best as possible.  Otherwise, I will see this patient back in 1 year for repeat clinical assessment. The patient understands all the plans discussed today and is in agreement with them.    Trenity Pha Kirby Funk, MD

## 2023-06-20 ENCOUNTER — Other Ambulatory Visit: Payer: Self-pay | Admitting: Oncology

## 2023-06-20 ENCOUNTER — Inpatient Hospital Stay (HOSPITAL_BASED_OUTPATIENT_CLINIC_OR_DEPARTMENT_OTHER): Payer: Medicare Other | Admitting: Oncology

## 2023-06-21 ENCOUNTER — Encounter: Payer: Self-pay | Admitting: Oncology

## 2023-06-23 ENCOUNTER — Telehealth: Payer: Self-pay | Admitting: Oncology

## 2023-06-23 NOTE — Telephone Encounter (Signed)
Contacted pt to schedule an appt. Unable to reach via phone, voicemail was left.    06/20/23 LOS: Follow-up disposition: Return in about 52 weeks (around 06/18/2024).  Check out comments: Labs 06-17-24

## 2023-07-08 ENCOUNTER — Other Ambulatory Visit: Payer: Self-pay

## 2023-07-08 ENCOUNTER — Ambulatory Visit (HOSPITAL_COMMUNITY)
Admission: RE | Admit: 2023-07-08 | Discharge: 2023-07-08 | Disposition: A | Discharge status: Home / Self Care | Payer: BC Managed Care – PPO | Source: Ambulatory Visit | Attending: Internal Medicine | Admitting: Internal Medicine

## 2023-07-08 ENCOUNTER — Encounter (HOSPITAL_COMMUNITY)
Admission: RE | Disposition: A | Discharge status: Home / Self Care | Payer: Self-pay | Source: Ambulatory Visit | Attending: Internal Medicine

## 2023-07-08 DIAGNOSIS — G4733 Obstructive sleep apnea (adult) (pediatric): Secondary | ICD-10-CM | POA: Insufficient documentation

## 2023-07-08 DIAGNOSIS — Z992 Dependence on renal dialysis: Secondary | ICD-10-CM | POA: Insufficient documentation

## 2023-07-08 DIAGNOSIS — N186 End stage renal disease: Secondary | ICD-10-CM | POA: Insufficient documentation

## 2023-07-08 DIAGNOSIS — E1122 Type 2 diabetes mellitus with diabetic chronic kidney disease: Secondary | ICD-10-CM | POA: Diagnosis not present

## 2023-07-08 DIAGNOSIS — Z7985 Long-term (current) use of injectable non-insulin antidiabetic drugs: Secondary | ICD-10-CM | POA: Diagnosis not present

## 2023-07-08 DIAGNOSIS — Z87891 Personal history of nicotine dependence: Secondary | ICD-10-CM | POA: Insufficient documentation

## 2023-07-08 DIAGNOSIS — T82858A Stenosis of vascular prosthetic devices, implants and grafts, initial encounter: Secondary | ICD-10-CM | POA: Insufficient documentation

## 2023-07-08 DIAGNOSIS — I509 Heart failure, unspecified: Secondary | ICD-10-CM | POA: Insufficient documentation

## 2023-07-08 DIAGNOSIS — I132 Hypertensive heart and chronic kidney disease with heart failure and with stage 5 chronic kidney disease, or end stage renal disease: Secondary | ICD-10-CM | POA: Insufficient documentation

## 2023-07-08 DIAGNOSIS — K219 Gastro-esophageal reflux disease without esophagitis: Secondary | ICD-10-CM | POA: Insufficient documentation

## 2023-07-08 DIAGNOSIS — Z79899 Other long term (current) drug therapy: Secondary | ICD-10-CM | POA: Insufficient documentation

## 2023-07-08 DIAGNOSIS — Z6839 Body mass index (BMI) 39.0-39.9, adult: Secondary | ICD-10-CM | POA: Diagnosis not present

## 2023-07-08 DIAGNOSIS — E785 Hyperlipidemia, unspecified: Secondary | ICD-10-CM | POA: Insufficient documentation

## 2023-07-08 DIAGNOSIS — D631 Anemia in chronic kidney disease: Secondary | ICD-10-CM | POA: Diagnosis not present

## 2023-07-08 DIAGNOSIS — Z794 Long term (current) use of insulin: Secondary | ICD-10-CM | POA: Insufficient documentation

## 2023-07-08 DIAGNOSIS — Z7901 Long term (current) use of anticoagulants: Secondary | ICD-10-CM | POA: Insufficient documentation

## 2023-07-08 DIAGNOSIS — Y832 Surgical operation with anastomosis, bypass or graft as the cause of abnormal reaction of the patient, or of later complication, without mention of misadventure at the time of the procedure: Secondary | ICD-10-CM | POA: Diagnosis not present

## 2023-07-08 HISTORY — PX: A/V FISTULAGRAM: CATH118298

## 2023-07-08 HISTORY — PX: PERIPHERAL VASCULAR BALLOON ANGIOPLASTY: CATH118281

## 2023-07-08 SURGERY — A/V FISTULAGRAM
Anesthesia: LOCAL

## 2023-07-08 MED ORDER — SODIUM CHLORIDE 0.9 % IV SOLN
INTRAVENOUS | Status: DC
Start: 2023-07-08 — End: 2023-07-08

## 2023-07-08 MED ORDER — MIDAZOLAM HCL 2 MG/2ML IJ SOLN
INTRAMUSCULAR | Status: AC
  Filled 2023-07-08: qty 2

## 2023-07-08 MED ORDER — HEPARIN (PORCINE) IN NACL 1000-0.9 UT/500ML-% IV SOLN
INTRAVENOUS | Status: DC | PRN
  Administered 2023-07-08: 500 mL

## 2023-07-08 MED ORDER — LIDOCAINE HCL (PF) 1 % IJ SOLN
INTRAMUSCULAR | Status: AC
  Filled 2023-07-08: qty 30

## 2023-07-08 MED ORDER — FENTANYL CITRATE (PF) 100 MCG/2ML IJ SOLN
INTRAMUSCULAR | Status: AC
  Filled 2023-07-08: qty 2

## 2023-07-08 MED ORDER — ACETAMINOPHEN 325 MG PO TABS: 650.0000 mg | ORAL_TABLET | ORAL | Status: DC | PRN

## 2023-07-08 MED ORDER — ONDANSETRON HCL 4 MG/2ML IJ SOLN: 4.0000 mg | Freq: Four times a day (QID) | INTRAMUSCULAR | Status: DC | PRN

## 2023-07-08 MED ORDER — MIDAZOLAM HCL 2 MG/2ML IJ SOLN
INTRAMUSCULAR | Status: DC | PRN
  Administered 2023-07-08: 1 mg via INTRAVENOUS

## 2023-07-08 MED ORDER — LIDOCAINE HCL (PF) 1 % IJ SOLN
INTRAMUSCULAR | Status: DC | PRN
  Administered 2023-07-08: 2 mL via SUBCUTANEOUS

## 2023-07-08 MED ORDER — IODIXANOL 320 MG/ML IV SOLN
INTRAVENOUS | Status: DC | PRN
  Administered 2023-07-08: 8 mL via INTRAVENOUS

## 2023-07-08 MED ORDER — FENTANYL CITRATE (PF) 100 MCG/2ML IJ SOLN
INTRAMUSCULAR | Status: DC | PRN
  Administered 2023-07-08: 25 ug via INTRAVENOUS

## 2023-07-08 SURGICAL SUPPLY — 9 items
BALLN MUSTANG 8X80X75 (BALLOONS) ×2
BALLOON MUSTANG 8X80X75 (BALLOONS) IMPLANT
COVER DOME SNAP 22 D (MISCELLANEOUS) ×2 IMPLANT
GUIDEWIRE ANGLED .035X150CM (WIRE) IMPLANT
MAT PREVALON FULL STRYKER (MISCELLANEOUS) IMPLANT
SHEATH PINNACLE R/O II 6F 4CM (SHEATH) IMPLANT
SYR MEDALLION 10ML (SYRINGE) IMPLANT
TRAY PV CATH (CUSTOM PROCEDURE TRAY) ×2 IMPLANT
WIRE MICRO SET SILHO 5FR 7 (SHEATH) IMPLANT

## 2023-07-08 NOTE — Op Note (Signed)
 Pt presenting with infiltration and difficult cannulations.  He has a R BC AVF which required elevation, branch ligation and BAM in 10/2022. He underwent venous angioplasty of the outflow cannulation zone vein again in 02/2023. Exam shows a pulsatile fistula which doesn't collapse and a high pitched bruit in the mid fistula.   Summary:  1)      The patient had successful angioplasty (8 Mustang FE ~20 atm) of significant stenosis in the cephalic vein body of the fistula. 2)      The body of the cephalic vein fistula and arch were patent with good flows. 3)      The centrals were widely patent. 4)      This left BCF remains amenable to future percutaneous intervention.  Description of procedure: The arm was prepped and draped in the usual sterile fashion. The left upper arm brachial cephalic fistula was cannulated (63097) with an 21G micropuncture needle directed in an antegrade direction. A guidewire was inserted and exchanged for a 6Fr sheath. Contrast [phone]) injection via the side port of the sheath was performed. The angiogram of the fistula (63097) showed an patent body of the left BCF with an 80% stenosis in the cannulation zone, patent (duplicated) cephalic vein arch and left centrals were patent.  The wire was advanced centrally without any difficulty. An 8x80 mm Mustang angioplasty balloon was inserted over a glide wire and positioned at the outflow cephalic vein stenosis. Venous angioplasty (63097) was carried out to 20 ATM with FULL effacement of the waist on the balloon.  Repeat angiogram showed 10% residual at the site of angioplasty with no evidence of extravasation.  The flow of contrast was quicker and the fistula was markedly less pulsatile.  Hemostasis: A 3-0 ethilon purse string suture was placed at the cannulation site on removal of the sheath.  Sedation: 1mg  Versed , 50mcg Fentanyl . Sedation time. 10 minutes  Contrast. 8 mL  Monitoring: Because of the patient's comorbid  conditions and sedation during the procedure, continuous EKG monitoring and O2 saturation monitoring was performed throughout the procedure by the RN. There were no abnormal arrhythmias encountered.  Complications: None.   Diagnoses: I87.1 Stricture of vein  N18.6 ESRD T82.858A Stricture of access  Procedure Coding:  [phone] Cannulation and angiogram of fistula, venous angioplasty (cephalic vein arch) V0032 Contrast  Recommendations:  1. Continue to cannulate the fistula with 15G needles.  2. Refer back for problems with flows. 3. Remove the suture next treatment.   Discharge: The patient was discharged home in stable condition. The patient was given education regarding the care of the dialysis access AVF and specific instructions in case of any problems.

## 2023-07-08 NOTE — Discharge Instructions (Signed)

## 2023-07-08 NOTE — H&P (Signed)
 Sean Valentine  Vascular Access Procedure H&P  Reason for Consultation: difficult cannulations, infiltration Requesting Provider: [facility]  HPI: Sean Valentine is an 58 y.o. male with ESRD on HD, GERD, obesity, OSA, DM, HTN, HL who presents for evaluation of difficulties with HD access.   He has a R BC AVF which required elevation, branch ligation and BAM in 10/2022.  He underwent venous angioplasty of the outflow cannulation zone vein again in 02/2023.   Recently he had difficult cannulations and an infiltration.    He is NPO with a driver.  He is on eliquis.   PMH: Past Medical History:  Diagnosis Date   Anemia, chronic disease 05/26/2018   Formatting of this note might be different from the original. Eval with [facility] and phlebotomy for Hemochromotosis.   Anticoagulant long-term use 11/26/2016   Arthritis of right foot 05/08/2016   Atypical chest pain 09/13/2020   Bacteremia due to group B Streptococcus 11/03/2020   Benign hypertension with CKD (chronic kidney disease) stage III (HCC) 07/12/2019   Bilateral leg edema    Cellulitis 11/26/2020   Chest pain at rest 08/16/2020   CHF (congestive heart failure) (HCC) 03/05/2012   COVID-19 virus infection 02/09/2020   Deformity of toe 08/01/2020   Diabetic ulcer of toe of right foot associated with type 2 diabetes mellitus, with fat layer exposed (HCC) 01/10/2020   Formatting of this note might be different from the original. Distal tuft right fifth toe   Dyslipidemia 03/05/2012   Essential hypertension 11/26/2016   GERD (gastroesophageal reflux disease) 08/01/2020   Gouty arthritis of left hand 01/15/2017   High risk medication use 11/26/2016   History of COVID-19 05/05/2020   Formatting of this note might be different from the original. 02/2020   History of diabetic ulcer of foot 04/04/2021   History of kidney stones    History of pulmonary embolism 11/26/2016   Formatting of this note might be different from  the original. Felt severe with permanent anticoagulation and Filter placed because of burden.   Hypertension    Kidney failure    stage 4   Localized edema 03/05/2012   LVH (left ventricular hypertrophy) 05/26/2018   Formatting of this note might be different from the original. 04/2018 EF 555-60%   Malaise and fatigue 11/26/2016   Medication monitoring encounter 11/15/2020   Metabolic acidosis 01/21/2022   MGUS (monoclonal gammopathy of unknown significance) 06/03/2018   Formatting of this note might be different from the original. Possible. Seen on SPEP 05/2018   Mild persistent asthma 11/26/2016   Mixed hyperlipidemia 11/26/2016   Morbid (severe) obesity due to excess calories (HCC) 03/05/2012   Morbid obesity with body mass index (BMI) of 40.0 to 49.9 (HCC) 11/26/2016   Nail dystrophy 05/20/2018   Nephrolithiasis 03/05/2012   Nonalcoholic steatohepatitis (NASH) 05/26/2018   Obesity, Class III, BMI 40-49.9 (morbid obesity) (HCC) 11/26/2016   Other hemochromatosis 03/21/2020   PICC (peripherally inserted central catheter) in place 12/12/2020   Posterior tibial tendinitis of right lower extremity 08/01/2020   Pre-ulcerative calluses 06/18/2019   Primary central sleep apnea 11/26/2016   Formatting of this note might be different from the original. bipap   Sepsis (HCC) 10/30/2020   Sepsis due to cellulitis (HCC) 10/30/2020   Sleep apnea 03/05/2012   Formatting of this note might be different from the original. [place].   Spinal stenosis of lumbar region 05/26/2018   Status post amputation of toe of right foot (HCC) 07/26/2015   Subfibular  impingement, right 03/13/2016   Tinea pedis 08/01/2020   Type 2 diabetes mellitus with stage 3b chronic kidney disease, with long-term current use of insulin  (HCC) 07/12/2019   Formatting of this note might be different from the original. IMO 10/01 Updates  Formatting of this note might be different from the original. 2000   Ulcer of right foot with  fat layer exposed (HCC) 12/05/2020   PSH: Past Surgical History:  Procedure Laterality Date   A/V FISTULAGRAM Right 07/22/2022   Procedure: A/V Fistulagram;  Surgeon: Sheree Penne Bruckner, MD;  Location: [facility] INVASIVE CV LAB;  Service: Cardiovascular;  Laterality: Right;   A/V FISTULAGRAM Right 10/21/2022   Procedure: A/V Fistulagram;  Surgeon: Sheree Penne Bruckner, MD;  Location: [facility] INVASIVE CV LAB;  Service: Cardiovascular;  Laterality: Right;   A/V FISTULAGRAM Right 02/17/2023   Procedure: A/V Fistulagram;  Surgeon: Eliza Bruckner RAMAN, MD;  Location: [facility] INVASIVE CV LAB;  Service: Cardiovascular;  Laterality: Right;   AV FISTULA PLACEMENT Right 05/17/2022   Procedure: RIGHT BRACHIOCEPHALIC ARTERIOVENOUS (AV) FISTULA CREATION;  Surgeon: Sheree Penne Bruckner, MD;  Location: [facility] OR;  Service: Vascular;  Laterality: Right;  regional with MAC   BUBBLE STUDY  11/03/2020   Procedure: BUBBLE STUDY;  Surgeon: Barbaraann Darryle Ned, MD;  Location: [facility] ENDOSCOPY;  Service: Cardiovascular;;   CATARACT EXTRACTION, BILATERAL Bilateral    CHOLECYSTECTOMY     CYSTOSCOPY W/ URETERAL STENT PLACEMENT Left 05/06/2005   DIALYSIS/PERMA CATHETER INSERTION     EYE SURGERY     FOOT SURGERY Right    Big toe amputation   IVC FILTER INSERTION  08/2016   KIDNEY STONE SURGERY     PERIPHERAL VASCULAR BALLOON ANGIOPLASTY  10/21/2022   Procedure: PERIPHERAL VASCULAR BALLOON ANGIOPLASTY;  Surgeon: Sheree Penne Bruckner, MD;  Location: [facility] INVASIVE CV LAB;  Service: Cardiovascular;;  R AV fistula   PERIPHERAL VASCULAR BALLOON ANGIOPLASTY Right 02/17/2023   Procedure: PERIPHERAL VASCULAR BALLOON ANGIOPLASTY;  Surgeon: Eliza Bruckner RAMAN, MD;  Location: [facility] INVASIVE CV LAB;  Service: Cardiovascular;  Laterality: Right;  rt arrm fistula w/DCB   REVISON OF ARTERIOVENOUS FISTULA Right 08/02/2022   Procedure: REVISION OF RIGHT ARM FISTULA WITH TRANSPOSITION AND BRANCH LIGATION;  Surgeon: Sheree Penne Bruckner, MD;  Location: [facility] OR;  Service: Vascular;  Laterality: Right;  Regional Block   SHOULDER SURGERY Left    TEE WITHOUT CARDIOVERSION N/A 11/03/2020   Procedure: TRANSESOPHAGEAL ECHOCARDIOGRAM (TEE);  Surgeon: Barbaraann Darryle Ned, MD;  Location: [facility] ENDOSCOPY;  Service: Cardiovascular;  Laterality: N/A;    Past Medical History:  Diagnosis Date   Anemia, chronic disease 05/26/2018   Formatting of this note might be different from the original. Eval with [facility] and phlebotomy for Hemochromotosis.   Anticoagulant long-term use 11/26/2016   Arthritis of right foot 05/08/2016   Atypical chest pain 09/13/2020   Bacteremia due to group B Streptococcus 11/03/2020   Benign hypertension with CKD (chronic kidney disease) stage III (HCC) 07/12/2019   Bilateral leg edema    Cellulitis 11/26/2020   Chest pain at rest 08/16/2020   CHF (congestive heart failure) (HCC) 03/05/2012   COVID-19 virus infection 02/09/2020   Deformity of toe 08/01/2020   Diabetic ulcer of toe of right foot associated with type 2 diabetes mellitus, with fat layer exposed (HCC) 01/10/2020   Formatting of this note might be different from the original. Distal tuft right fifth toe   Dyslipidemia 03/05/2012   Essential hypertension 11/26/2016   GERD (gastroesophageal reflux disease) 08/01/2020  Gouty arthritis of left hand 01/15/2017   High risk medication use 11/26/2016   History of COVID-19 05/05/2020   Formatting of this note might be different from the original. 02/2020   History of diabetic ulcer of foot 04/04/2021   History of kidney stones    History of pulmonary embolism 11/26/2016   Formatting of this note might be different from the original. Felt severe with permanent anticoagulation and Filter placed because of burden.   Hypertension    Kidney failure    stage 4   Localized edema 03/05/2012   LVH (left ventricular hypertrophy) 05/26/2018   Formatting of this note might be different from the original.  04/2018 EF 555-60%   Malaise and fatigue 11/26/2016   Medication monitoring encounter 11/15/2020   Metabolic acidosis 01/21/2022   MGUS (monoclonal gammopathy of unknown significance) 06/03/2018   Formatting of this note might be different from the original. Possible. Seen on SPEP 05/2018   Mild persistent asthma 11/26/2016   Mixed hyperlipidemia 11/26/2016   Morbid (severe) obesity due to excess calories (HCC) 03/05/2012   Morbid obesity with body mass index (BMI) of 40.0 to 49.9 (HCC) 11/26/2016   Nail dystrophy 05/20/2018   Nephrolithiasis 03/05/2012   Nonalcoholic steatohepatitis (NASH) 05/26/2018   Obesity, Class III, BMI 40-49.9 (morbid obesity) (HCC) 11/26/2016   Other hemochromatosis 03/21/2020   PICC (peripherally inserted central catheter) in place 12/12/2020   Posterior tibial tendinitis of right lower extremity 08/01/2020   Pre-ulcerative calluses 06/18/2019   Primary central sleep apnea 11/26/2016   Formatting of this note might be different from the original. bipap   Sepsis (HCC) 10/30/2020   Sepsis due to cellulitis (HCC) 10/30/2020   Sleep apnea 03/05/2012   Formatting of this note might be different from the original. [place].   Spinal stenosis of lumbar region 05/26/2018   Status post amputation of toe of right foot (HCC) 07/26/2015   Subfibular impingement, right 03/13/2016   Tinea pedis 08/01/2020   Type 2 diabetes mellitus with stage 3b chronic kidney disease, with long-term current use of insulin  (HCC) 07/12/2019   Formatting of this note might be different from the original. IMO 10/01 Updates  Formatting of this note might be different from the original. 2000   Ulcer of right foot with fat layer exposed (HCC) 12/05/2020    Medications:  I have reviewed the patient's current medications.  Medications Prior to Admission  Medication Sig Dispense Refill   acetaminophen  (TYLENOL ) 500 MG tablet Take 500 mg by mouth Valentine 6 (six) hours as needed for moderate pain  or mild pain.     apixaban (ELIQUIS) 5 MG TABS tablet Take 5 mg by mouth 2 (two) times daily.     atorvastatin (LIPITOR) 10 MG tablet Take 10 mg by mouth at bedtime.     bisacodyl (DULCOLAX) 5 MG EC tablet Take 5 mg by mouth daily as needed for moderate constipation.     carvedilol  (COREG ) 25 MG tablet Take 1 tablet (25 mg total) by mouth 2 (two) times daily with a meal. (Patient taking differently: Take 50 mg by mouth 2 (two) times daily with a meal. Does not take on Dialysis days Mon., Wed. and Friday in the morning) 90 tablet 1   HUMALOG KWIKPEN 200 UNIT/ML KwikPen Inject 30 Units into the skin 3 (three) times daily with meals.     insulin  degludec (TRESIBA) 200 UNIT/ML FlexTouch Pen Inject 100 Units into the skin daily.     nitroGLYCERIN  (NITROSTAT ) 0.4 MG SL  tablet Place 0.4 mg under the tongue Valentine 5 (five) minutes as needed for chest pain.     OZEMPIC, 0.25 OR 0.5 MG/DOSE, 2 MG/3ML SOPN Inject 0.5 mg into the skin once a week.     promethazine (PHENERGAN) 25 MG tablet Take 25 mg by mouth Valentine 6 (six) hours as needed for nausea or vomiting.     sucroferric oxyhydroxide (VELPHORO) 500 MG chewable tablet Chew 1,500 mg by mouth 3 (three) times daily with meals.     Tenapanor HCl, CKD, (XPHOZAH) 30 MG TABS Take 30 mg by mouth 2 (two) times daily.     torsemide  (DEMADEX ) 100 MG tablet Take 100 mg by mouth daily.     Methoxy PEG-Epoetin Beta (MIRCERA IJ) Mircera      ALLERGIES:  No Known Allergies  FAM HX: Family History  Problem Relation Age of Onset   Cancer Mother    Clotting disorder Mother    Stroke Mother    Alzheimer's disease Mother    Diabetes Mother    Diabetes Father     Social History:   reports that he quit smoking about 39 years ago. His smoking use included cigarettes. He started smoking about 40 years ago. He has never used smokeless tobacco. He reports that he does not currently use alcohol. He reports that he does not currently use drugs.  ROS: 12 system relevant  ROS per HPI above  Blood pressure 137/63, pulse 83, resp. rate 14, weight (!) 152 kg, SpO2 97%. PHYSICAL EXAM: Gen: morbidly obese man who is comfortable  Eyes: anicteric, EOMI, glasses ZWU:rojdd 2 airway CV:  NSR Lungs: clear on RA Extr: RUE AVF pulsatile with weak thrill, resolving ecchymosis mid AVF, good augmentation Neuro: non focal    No results found for this or any previous visit (from the past 48 hours).  No results found.  Assessment/PlanJohn Valentine is an 58 y.o. male with ESRD on HD, GERD, obesity, OSA, DM, HTN, HL who presents for evaluation of difficulties with HD access.   **ESRD with dialysis access dysfunction:  Presenting with infiltration and difficult cannulations.  Exam suggests outflow stenosis in the cannulation zone.  Consented for angiogram and likely angioplasty.  Risk benefit alternatives discussed particularly increased risk for bleeding with eliquis and he elects to proceed.  Plan local lidocaine  and conscious sedation.  Sean Valentine 07/08/2023, 1:51 PM

## 2023-07-09 ENCOUNTER — Encounter (HOSPITAL_COMMUNITY): Payer: Self-pay | Admitting: Internal Medicine

## 2023-08-28 ENCOUNTER — Encounter (HOSPITAL_COMMUNITY)
Admission: AD | Disposition: A | Discharge status: Home / Self Care | Payer: Self-pay | Source: Ambulatory Visit | Attending: Nephrology

## 2023-08-28 ENCOUNTER — Ambulatory Visit (HOSPITAL_COMMUNITY)
Admission: AD | Admit: 2023-08-28 | Discharge: 2023-08-28 | Disposition: A | Discharge status: Home / Self Care | Source: Ambulatory Visit | Attending: Nephrology | Admitting: Nephrology

## 2023-08-28 ENCOUNTER — Other Ambulatory Visit: Payer: Self-pay

## 2023-08-28 DIAGNOSIS — I132 Hypertensive heart and chronic kidney disease with heart failure and with stage 5 chronic kidney disease, or end stage renal disease: Secondary | ICD-10-CM | POA: Insufficient documentation

## 2023-08-28 DIAGNOSIS — N186 End stage renal disease: Secondary | ICD-10-CM | POA: Diagnosis not present

## 2023-08-28 DIAGNOSIS — Z87891 Personal history of nicotine dependence: Secondary | ICD-10-CM | POA: Diagnosis not present

## 2023-08-28 DIAGNOSIS — Y832 Surgical operation with anastomosis, bypass or graft as the cause of abnormal reaction of the patient, or of later complication, without mention of misadventure at the time of the procedure: Secondary | ICD-10-CM | POA: Diagnosis not present

## 2023-08-28 DIAGNOSIS — Z7985 Long-term (current) use of injectable non-insulin antidiabetic drugs: Secondary | ICD-10-CM | POA: Insufficient documentation

## 2023-08-28 DIAGNOSIS — E1122 Type 2 diabetes mellitus with diabetic chronic kidney disease: Secondary | ICD-10-CM | POA: Insufficient documentation

## 2023-08-28 DIAGNOSIS — Z833 Family history of diabetes mellitus: Secondary | ICD-10-CM | POA: Insufficient documentation

## 2023-08-28 DIAGNOSIS — D631 Anemia in chronic kidney disease: Secondary | ICD-10-CM | POA: Insufficient documentation

## 2023-08-28 DIAGNOSIS — T82868A Thrombosis of vascular prosthetic devices, implants and grafts, initial encounter: Secondary | ICD-10-CM | POA: Diagnosis present

## 2023-08-28 DIAGNOSIS — Z794 Long term (current) use of insulin: Secondary | ICD-10-CM | POA: Diagnosis not present

## 2023-08-28 DIAGNOSIS — Z7901 Long term (current) use of anticoagulants: Secondary | ICD-10-CM | POA: Insufficient documentation

## 2023-08-28 HISTORY — PX: PERIPHERAL VASCULAR THROMBECTOMY: CATH118306

## 2023-08-28 HISTORY — PX: VENOUS ANGIOPLASTY: CATH118376

## 2023-08-28 SURGERY — PERIPHERAL VASCULAR THROMBECTOMY
Anesthesia: LOCAL | Laterality: Right

## 2023-08-28 MED ORDER — FENTANYL CITRATE (PF) 100 MCG/2ML IJ SOLN
INTRAMUSCULAR | Status: AC
  Filled 2023-08-28: qty 2

## 2023-08-28 MED ORDER — MIDAZOLAM HCL 2 MG/2ML IJ SOLN
INTRAMUSCULAR | Status: DC | PRN
  Administered 2023-08-28: 1 mg via INTRAVENOUS

## 2023-08-28 MED ORDER — MIDAZOLAM HCL 2 MG/2ML IJ SOLN
INTRAMUSCULAR | Status: AC
  Filled 2023-08-28: qty 2

## 2023-08-28 MED ORDER — HEPARIN SODIUM (PORCINE) 1000 UNIT/ML IJ SOLN
INTRAMUSCULAR | Status: DC | PRN
  Administered 2023-08-28: 5000 [IU] via INTRAVENOUS

## 2023-08-28 MED ORDER — LIDOCAINE HCL (PF) 1 % IJ SOLN
INTRAMUSCULAR | Status: AC
  Filled 2023-08-28: qty 30

## 2023-08-28 MED ORDER — HEPARIN (PORCINE) IN NACL 1000-0.9 UT/500ML-% IV SOLN
INTRAVENOUS | Status: DC | PRN
  Administered 2023-08-28: 500 mL

## 2023-08-28 MED ORDER — FENTANYL CITRATE (PF) 100 MCG/2ML IJ SOLN
INTRAMUSCULAR | Status: DC | PRN
  Administered 2023-08-28: 25 ug via INTRAVENOUS

## 2023-08-28 MED ORDER — IODIXANOL 320 MG/ML IV SOLN
INTRAVENOUS | Status: DC | PRN
  Administered 2023-08-28: 20 mL via INTRAVENOUS

## 2023-08-28 MED ORDER — LIDOCAINE HCL (PF) 1 % IJ SOLN
INTRAMUSCULAR | Status: DC | PRN
  Administered 2023-08-28 (×2): 2 mL via SUBCUTANEOUS

## 2023-08-28 MED ORDER — HEPARIN SODIUM (PORCINE) 1000 UNIT/ML IJ SOLN
INTRAMUSCULAR | Status: AC
Start: 2023-08-28 — End: ?
  Filled 2023-08-28: qty 10

## 2023-08-28 SURGICAL SUPPLY — 20 items
BAG SNAP BAND KOVER 36X36 (MISCELLANEOUS) ×2 IMPLANT
BALLN LUTONIX AV 9X60X75 (BALLOONS) ×2 IMPLANT
BALLN MUSTANG 7.0X40 75 (BALLOONS) ×2 IMPLANT
BALLN MUSTANG 8.0X40 75 (BALLOONS) ×2 IMPLANT
BALLN MUSTANG 8X80X75 (BALLOONS) ×4 IMPLANT
BALLOON FOGARTY 5FR 40 (CATHETERS) ×1 IMPLANT
BALLOON LUTONIX AV 9X60X75 (BALLOONS) ×1 IMPLANT
BALLOON MUSTANG 7.0X40 75 (BALLOONS) ×1 IMPLANT
BALLOON MUSTANG 8.0X40 75 (BALLOONS) ×1 IMPLANT
BALLOON MUSTANG 8X80X75 (BALLOONS) ×2 IMPLANT
CATH FOGARTY BALL 5FR 40 (CATHETERS) ×2 IMPLANT
CATH MACH 1 ST 7FR 55 (CATHETERS) ×1 IMPLANT
COVER DOME SNAP 22 D (MISCELLANEOUS) ×2 IMPLANT
GUIDEWIRE ANGLED .035 180CM (WIRE) ×1 IMPLANT
GUIDEWIRE ANGLED .035X150CM (WIRE) ×1 IMPLANT
KIT MICROPUNCTURE NIT STIFF (SHEATH) ×2 IMPLANT
SHEATH PINNACLE R/O II 7F 4CM (SHEATH) ×2 IMPLANT
SYR MEDALLION 10ML (SYRINGE) ×1 IMPLANT
TRAY PV CATH (CUSTOM PROCEDURE TRAY) ×2 IMPLANT
WIRE BENTSON .035X145CM (WIRE) ×1 IMPLANT

## 2023-08-28 NOTE — H&P (Signed)
 [facility]  Vascular Access Procedure H&P  Reason for Consultation: thrombosed rt BCF  Requesting Provider: [facility]  Assessment/PlanJohn Valentine is an 58 y.o. male with ESRD on HD, GERD, obesity, OSA, DM, HTN, HL who presents for evaluation of difficulties with HD access.   **ESRD with dialysis access dysfunction:  Presenting with a thrombosed right upper arm brachiocephalic fistula initially placed by Dr. Randie Heinz May 17, 2022.   Consented for a declot, angiogram and angioplasty.  Risk benefit alternatives discussed particularly increased risk for bleeding with eliquis and he elects to proceed.  Plan heparin, local lidocaine and conscious sedation.  HPI: Sean Valentine is an 58 y.o. male with ESRD on HD, GERD, obesity, OSA, DM, HTN, HL who presents for evaluation of thrombosed right upper arm brachiocephalic fistula with last outflow angioplasty by Dr. Glenna Fellows on July 08, 2023.    He has a R BC AVF which required elevation, branch ligation and BAM in 10/2022.  He underwent venous angioplasty of the outflow cannulation zone vein again in 02/2023.     He is NPO with a driver.  He is on eliquis has been out of Eliquis for many days.Marland Kitchen   PMH: Past Medical History:  Diagnosis Date   Anemia, chronic disease 05/26/2018   Formatting of this note might be different from the original. Eval with [facility] and phlebotomy for Hemochromotosis.   Anticoagulant long-term use 11/26/2016   Arthritis of right foot 05/08/2016   Atypical chest pain 09/13/2020   Bacteremia due to group B Streptococcus 11/03/2020   Benign hypertension with CKD (chronic kidney disease) stage III (HCC) 07/12/2019   Bilateral leg edema    Cellulitis 11/26/2020   Chest pain at rest 08/16/2020   CHF (congestive heart failure) (HCC) 03/05/2012   COVID-19 virus infection 02/09/2020   Deformity of toe 08/01/2020   Diabetic ulcer of toe of right foot associated with type 2 diabetes mellitus, with fat layer  exposed (HCC) 01/10/2020   Formatting of this note might be different from the original. Distal tuft right fifth toe   Dyslipidemia 03/05/2012   Essential hypertension 11/26/2016   GERD (gastroesophageal reflux disease) 08/01/2020   Gouty arthritis of left hand 01/15/2017   High risk medication use 11/26/2016   History of COVID-19 05/05/2020   Formatting of this note might be different from the original. 02/2020   History of diabetic ulcer of foot 04/04/2021   History of kidney stones    History of pulmonary embolism 11/26/2016   Formatting of this note might be different from the original. Felt severe with permanent anticoagulation and Filter placed because of burden.   Hypertension    Kidney failure    stage 4   Localized edema 03/05/2012   LVH (left ventricular hypertrophy) 05/26/2018   Formatting of this note might be different from the original. 04/2018 EF 555-60%   Malaise and fatigue 11/26/2016   Medication monitoring encounter 11/15/2020   Metabolic acidosis 01/21/2022   MGUS (monoclonal gammopathy of unknown significance) 06/03/2018   Formatting of this note might be different from the original. Possible. Seen on SPEP 05/2018   Mild persistent asthma 11/26/2016   Mixed hyperlipidemia 11/26/2016   Morbid (severe) obesity due to excess calories (HCC) 03/05/2012   Morbid obesity with body mass index (BMI) of 40.0 to 49.9 (HCC) 11/26/2016   Nail dystrophy 05/20/2018   Nephrolithiasis 03/05/2012   Nonalcoholic steatohepatitis (NASH) 05/26/2018   Obesity, Class III, BMI 40-49.9 (morbid obesity) (HCC) 11/26/2016   Other hemochromatosis  03/21/2020   PICC (peripherally inserted central catheter) in place 12/12/2020   Posterior tibial tendinitis of right lower extremity 08/01/2020   Pre-ulcerative calluses 06/18/2019   Primary central sleep apnea 11/26/2016   Formatting of this note might be different from the original. bipap   Sepsis (HCC) 10/30/2020   Sepsis due to cellulitis  (HCC) 10/30/2020   Sleep apnea 03/05/2012   Formatting of this note might be different from the original. [place].   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 IMO 10/01 Updates  Formatting of this note might be different from the original. 2000   Ulcer of right foot with fat layer exposed (HCC) 12/05/2020   PSH: Past Surgical History:  Procedure Laterality Date   A/V FISTULAGRAM Right 07/22/2022   Procedure: A/V Fistulagram;  Surgeon: Maeola Harman, MD;  Location: [facility] INVASIVE CV LAB;  Service: Cardiovascular;  Laterality: Right;   A/V FISTULAGRAM Right 10/21/2022   Procedure: A/V Fistulagram;  Surgeon: Maeola Harman, MD;  Location: [facility] INVASIVE CV LAB;  Service: Cardiovascular;  Laterality: Right;   A/V FISTULAGRAM Right 02/17/2023   Procedure: A/V Fistulagram;  Surgeon: Chuck Hint, MD;  Location: [facility] INVASIVE CV LAB;  Service: Cardiovascular;  Laterality: Right;   A/V FISTULAGRAM N/A 07/08/2023   Procedure: A/V Fistulagram;  Surgeon: Tyler Pita, MD;  Location: [facility] INVASIVE CV LAB;  Service: Cardiovascular;  Laterality: N/A;   AV FISTULA PLACEMENT Right 05/17/2022   Procedure: RIGHT BRACHIOCEPHALIC ARTERIOVENOUS (AV) FISTULA CREATION;  Surgeon: Maeola Harman, MD;  Location: [facility] OR;  Service: Vascular;  Laterality: Right;  regional with MAC   BUBBLE STUDY  11/03/2020   Procedure: BUBBLE STUDY;  Surgeon: Sande Rives, MD;  Location: [facility] ENDOSCOPY;  Service: Cardiovascular;;   CATARACT EXTRACTION, BILATERAL Bilateral    CHOLECYSTECTOMY     CYSTOSCOPY W/ URETERAL STENT PLACEMENT Left 05/06/2005   DIALYSIS/PERMA CATHETER  INSERTION     EYE SURGERY     FOOT SURGERY Right    Big toe amputation   IVC FILTER INSERTION  08/2016   KIDNEY STONE SURGERY     PERIPHERAL VASCULAR BALLOON ANGIOPLASTY  10/21/2022   Procedure: PERIPHERAL VASCULAR BALLOON ANGIOPLASTY;  Surgeon: Maeola Harman, MD;  Location: [facility] INVASIVE CV LAB;  Service: Cardiovascular;;  R AV fistula   PERIPHERAL VASCULAR BALLOON ANGIOPLASTY Right 02/17/2023   Procedure: PERIPHERAL VASCULAR BALLOON ANGIOPLASTY;  Surgeon: Chuck Hint, MD;  Location: [facility] INVASIVE CV LAB;  Service: Cardiovascular;  Laterality: Right;  rt arrm fistula w/DCB   PERIPHERAL VASCULAR BALLOON ANGIOPLASTY  07/08/2023   Procedure: PERIPHERAL VASCULAR BALLOON ANGIOPLASTY;  Surgeon: Tyler Pita, MD;  Location: [facility] INVASIVE CV LAB;  Service: Cardiovascular;;  Cephalic Vein   REVISON OF ARTERIOVENOUS FISTULA Right 08/02/2022   Procedure: REVISION OF RIGHT ARM FISTULA WITH TRANSPOSITION AND BRANCH LIGATION;  Surgeon: Maeola Harman, MD;  Location: [facility] OR;  Service: Vascular;  Laterality: Right;  Regional Block   SHOULDER SURGERY Left    TEE WITHOUT CARDIOVERSION N/A 11/03/2020   Procedure: TRANSESOPHAGEAL ECHOCARDIOGRAM (TEE);  Surgeon: Sande Rives, MD;  Location: [facility] ENDOSCOPY;  Service: Cardiovascular;  Laterality: N/A;    Past Medical History:  Diagnosis Date   Anemia, chronic disease 05/26/2018   Formatting of this note might be different from the original. Eval with [facility] and phlebotomy for Hemochromotosis.   Anticoagulant long-term use 11/26/2016   Arthritis of right foot 05/08/2016   Atypical chest pain 09/13/2020   Bacteremia due to group B Streptococcus 11/03/2020   Benign hypertension with CKD (chronic kidney disease) stage III (HCC) 07/12/2019   Bilateral leg edema    Cellulitis 11/26/2020   Chest pain at rest 08/16/2020   CHF (congestive heart failure) (HCC) 03/05/2012   COVID-19 virus infection 02/09/2020   Deformity of toe  08/01/2020   Diabetic ulcer of toe of right foot associated with type 2 diabetes mellitus, with fat layer exposed (HCC) 01/10/2020   Formatting of this note might be different from the original. Distal tuft right fifth toe   Dyslipidemia 03/05/2012   Essential hypertension 11/26/2016   GERD (gastroesophageal reflux disease) 08/01/2020   Gouty arthritis of left hand 01/15/2017   High risk medication use 11/26/2016   History of COVID-19 05/05/2020   Formatting of this note might be different from the original. 02/2020   History of diabetic ulcer of foot 04/04/2021   History of kidney stones    History of pulmonary embolism 11/26/2016   Formatting of this note might be different from the original. Felt severe with permanent anticoagulation and Filter placed because of burden.   Hypertension    Kidney failure    stage 4   Localized edema 03/05/2012   LVH (left ventricular hypertrophy) 05/26/2018   Formatting of this note might be different from the original. 04/2018 EF 555-60%   Malaise and fatigue 11/26/2016   Medication monitoring encounter 11/15/2020   Metabolic acidosis 01/21/2022   MGUS (monoclonal gammopathy of unknown significance) 06/03/2018   Formatting of this note might be different from the original. Possible. Seen on SPEP 05/2018   Mild persistent asthma 11/26/2016   Mixed hyperlipidemia 11/26/2016   Morbid (severe) obesity due to excess calories (HCC) 03/05/2012   Morbid obesity with body mass index (BMI) of 40.0 to 49.9 (HCC) 11/26/2016   Nail dystrophy 05/20/2018   Nephrolithiasis 03/05/2012   Nonalcoholic steatohepatitis (NASH) 05/26/2018   Obesity, Class III, BMI 40-49.9 (morbid obesity) (HCC) 11/26/2016   Other hemochromatosis 03/21/2020   PICC (peripherally inserted central catheter) in place 12/12/2020   Posterior tibial tendinitis of right lower extremity 08/01/2020   Pre-ulcerative calluses 06/18/2019   Primary central sleep apnea 11/26/2016   Formatting of  this note might be different from the original. bipap   Sepsis (HCC) 10/30/2020   Sepsis due to cellulitis (HCC) 10/30/2020   Sleep apnea 03/05/2012   Formatting of this note might be different from the original. [place].   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 IMO 10/01 Updates  Formatting of this note might be different from the original. 2000   Ulcer of right foot with fat layer exposed (HCC) 12/05/2020    Medications:  I have reviewed the patient's current medications.  Medications Prior to Admission  Medication Sig Dispense Refill   acetaminophen (TYLENOL) 500 MG tablet Take 500 mg by mouth every 6 (six) hours as needed for moderate  pain or mild pain.     apixaban (ELIQUIS) 5 MG TABS tablet Take 5 mg by mouth 2 (two) times daily.     atorvastatin (LIPITOR) 10 MG tablet Take 10 mg by mouth at bedtime.     bisacodyl (DULCOLAX) 5 MG EC tablet Take 5 mg by mouth daily as needed for moderate constipation.     carvedilol (COREG) 25 MG tablet Take 1 tablet (25 mg total) by mouth 2 (two) times daily with a meal. (Patient taking differently: Take 50 mg by mouth 2 (two) times daily with a meal. Does not take on Dialysis days Mon., Wed. and Friday in the morning) 90 tablet 1   HUMALOG KWIKPEN 200 UNIT/ML KwikPen Inject 30 Units into the skin 3 (three) times daily with meals.     insulin degludec (TRESIBA) 200 UNIT/ML FlexTouch Pen Inject 100 Units into the skin daily.     Methoxy PEG-Epoetin Beta (MIRCERA IJ) Mircera     nitroGLYCERIN (NITROSTAT) 0.4 MG SL tablet Place 0.4 mg under the tongue every 5 (five) minutes as needed for chest pain.     OZEMPIC, 0.25 OR 0.5 MG/DOSE, 2  MG/3ML SOPN Inject 0.5 mg into the skin once a week.     promethazine (PHENERGAN) 25 MG tablet Take 25 mg by mouth every 6 (six) hours as needed for nausea or vomiting.     sucroferric oxyhydroxide (VELPHORO) 500 MG chewable tablet Chew 1,500 mg by mouth 3 (three) times daily with meals.     Tenapanor HCl, CKD, (XPHOZAH) 30 MG TABS Take 30 mg by mouth 2 (two) times daily.     torsemide (DEMADEX) 100 MG tablet Take 100 mg by mouth daily.      ALLERGIES:  No Known Allergies  FAM HX: Family History  Problem Relation Age of Onset   Cancer Mother    Clotting disorder Mother    Stroke Mother    Alzheimer's disease Mother    Diabetes Mother    Diabetes Father     Social History:   reports that he quit smoking about 39 years ago. His smoking use included cigarettes. He started smoking about 40 years ago. He has never used smokeless tobacco. He reports that he does not currently use alcohol. He reports that he does not currently use drugs.  ROS: 12 system relevant ROS per HPI above  Blood pressure 139/75, pulse 80, resp. rate 14, SpO2 97%. PHYSICAL EXAM: Gen: morbidly obese man who is comfortable  Eyes: anicteric, EOMI, glasses WUJ:[id number] 2 airway CV:  NSR Lungs: clear on RA Extr: RUE AVF no bruit Neuro: non focal    No results found for this or any previous visit (from the past 48 hours).  No results found.   Ethelene Hal 08/28/2023, 10:51 AM

## 2023-08-28 NOTE — H&P (View-Only) (Signed)
 [facility]  Vascular Access Procedure H&P  Reason for Consultation: thrombosed rt BCF  Requesting Provider: [facility]  Assessment/PlanJohn Holst is an 58 y.o. male with ESRD on HD, GERD, obesity, OSA, DM, HTN, HL who presents for evaluation of difficulties with HD access.   **ESRD with dialysis access dysfunction:  Presenting with a thrombosed right upper arm brachiocephalic fistula initially placed by Dr. Randie Heinz May 17, 2022.   Consented for a declot, angiogram and angioplasty.  Risk benefit alternatives discussed particularly increased risk for bleeding with eliquis and he elects to proceed.  Plan heparin, local lidocaine and conscious sedation.  HPI: Dandra Velardi is an 58 y.o. male with ESRD on HD, GERD, obesity, OSA, DM, HTN, HL who presents for evaluation of thrombosed right upper arm brachiocephalic fistula with last outflow angioplasty by Dr. Glenna Fellows on July 08, 2023.    He has a R BC AVF which required elevation, branch ligation and BAM in 10/2022.  He underwent venous angioplasty of the outflow cannulation zone vein again in 02/2023.     He is NPO with a driver.  He is on eliquis has been out of Eliquis for many days.Marland Kitchen   PMH: Past Medical History:  Diagnosis Date   Anemia, chronic disease 05/26/2018   Formatting of this note might be different from the original. Eval with Lewis and phlebotomy for Hemochromotosis.   Anticoagulant long-term use 11/26/2016   Arthritis of right foot 05/08/2016   Atypical chest pain 09/13/2020   Bacteremia due to group B Streptococcus 11/03/2020   Benign hypertension with CKD (chronic kidney disease) stage III (HCC) 07/12/2019   Bilateral leg edema    Cellulitis 11/26/2020   Chest pain at rest 08/16/2020   CHF (congestive heart failure) (HCC) 03/05/2012   COVID-19 virus infection 02/09/2020   Deformity of toe 08/01/2020   Diabetic ulcer of toe of right foot associated with type 2 diabetes mellitus, with fat layer  exposed (HCC) 01/10/2020   Formatting of this note might be different from the original. Distal tuft right fifth toe   Dyslipidemia 03/05/2012   Essential hypertension 11/26/2016   GERD (gastroesophageal reflux disease) 08/01/2020   Gouty arthritis of left hand 01/15/2017   High risk medication use 11/26/2016   History of COVID-19 05/05/2020   Formatting of this note might be different from the original. 02/2020   History of diabetic ulcer of foot 04/04/2021   History of kidney stones    History of pulmonary embolism 11/26/2016   Formatting of this note might be different from the original. Felt severe with permanent anticoagulation and Filter placed because of burden.   Hypertension    Kidney failure    stage 4   Localized edema 03/05/2012   LVH (left ventricular hypertrophy) 05/26/2018   Formatting of this note might be different from the original. 04/2018 EF 555-60%   Malaise and fatigue 11/26/2016   Medication monitoring encounter 11/15/2020   Metabolic acidosis 01/21/2022   MGUS (monoclonal gammopathy of unknown significance) 06/03/2018   Formatting of this note might be different from the original. Possible. Seen on SPEP 05/2018   Mild persistent asthma 11/26/2016   Mixed hyperlipidemia 11/26/2016   Morbid (severe) obesity due to excess calories (HCC) 03/05/2012   Morbid obesity with body mass index (BMI) of 40.0 to 49.9 (HCC) 11/26/2016   Nail dystrophy 05/20/2018   Nephrolithiasis 03/05/2012   Nonalcoholic steatohepatitis (NASH) 05/26/2018   Obesity, Class III, BMI 40-49.9 (morbid obesity) (HCC) 11/26/2016   Other hemochromatosis  03/21/2020   PICC (peripherally inserted central catheter) in place 12/12/2020   Posterior tibial tendinitis of right lower extremity 08/01/2020   Pre-ulcerative calluses 06/18/2019   Primary central sleep apnea 11/26/2016   Formatting of this note might be different from the original. bipap   Sepsis (HCC) 10/30/2020   Sepsis due to cellulitis  (HCC) 10/30/2020   Sleep apnea 03/05/2012   Formatting of this note might be different from the original. [place].   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 IMO 10/01 Updates  Formatting of this note might be different from the original. 2000   Ulcer of right foot with fat layer exposed (HCC) 12/05/2020   PSH: Past Surgical History:  Procedure Laterality Date   A/V FISTULAGRAM Right 07/22/2022   Procedure: A/V Fistulagram;  Surgeon: Maeola Harman, MD;  Location: [facility] INVASIVE CV LAB;  Service: Cardiovascular;  Laterality: Right;   A/V FISTULAGRAM Right 10/21/2022   Procedure: A/V Fistulagram;  Surgeon: Maeola Harman, MD;  Location: [facility] INVASIVE CV LAB;  Service: Cardiovascular;  Laterality: Right;   A/V FISTULAGRAM Right 02/17/2023   Procedure: A/V Fistulagram;  Surgeon: Chuck Hint, MD;  Location: [facility] INVASIVE CV LAB;  Service: Cardiovascular;  Laterality: Right;   A/V FISTULAGRAM N/A 07/08/2023   Procedure: A/V Fistulagram;  Surgeon: Tyler Pita, MD;  Location: [facility] INVASIVE CV LAB;  Service: Cardiovascular;  Laterality: N/A;   AV FISTULA PLACEMENT Right 05/17/2022   Procedure: RIGHT BRACHIOCEPHALIC ARTERIOVENOUS (AV) FISTULA CREATION;  Surgeon: Maeola Harman, MD;  Location: [facility] OR;  Service: Vascular;  Laterality: Right;  regional with MAC   BUBBLE STUDY  11/03/2020   Procedure: BUBBLE STUDY;  Surgeon: Sande Rives, MD;  Location: [facility] ENDOSCOPY;  Service: Cardiovascular;;   CATARACT EXTRACTION, BILATERAL Bilateral    CHOLECYSTECTOMY     CYSTOSCOPY W/ URETERAL STENT PLACEMENT Left 05/06/2005   DIALYSIS/PERMA CATHETER  INSERTION     EYE SURGERY     FOOT SURGERY Right    Big toe amputation   IVC FILTER INSERTION  08/2016   KIDNEY STONE SURGERY     PERIPHERAL VASCULAR BALLOON ANGIOPLASTY  10/21/2022   Procedure: PERIPHERAL VASCULAR BALLOON ANGIOPLASTY;  Surgeon: Maeola Harman, MD;  Location: [facility] INVASIVE CV LAB;  Service: Cardiovascular;;  R AV fistula   PERIPHERAL VASCULAR BALLOON ANGIOPLASTY Right 02/17/2023   Procedure: PERIPHERAL VASCULAR BALLOON ANGIOPLASTY;  Surgeon: Chuck Hint, MD;  Location: [facility] INVASIVE CV LAB;  Service: Cardiovascular;  Laterality: Right;  rt arrm fistula w/DCB   PERIPHERAL VASCULAR BALLOON ANGIOPLASTY  07/08/2023   Procedure: PERIPHERAL VASCULAR BALLOON ANGIOPLASTY;  Surgeon: Tyler Pita, MD;  Location: [facility] INVASIVE CV LAB;  Service: Cardiovascular;;  Cephalic Vein   REVISON OF ARTERIOVENOUS FISTULA Right 08/02/2022   Procedure: REVISION OF RIGHT ARM FISTULA WITH TRANSPOSITION AND BRANCH LIGATION;  Surgeon: Maeola Harman, MD;  Location: [facility] OR;  Service: Vascular;  Laterality: Right;  Regional Block   SHOULDER SURGERY Left    TEE WITHOUT CARDIOVERSION N/A 11/03/2020   Procedure: TRANSESOPHAGEAL ECHOCARDIOGRAM (TEE);  Surgeon: Sande Rives, MD;  Location: [facility] ENDOSCOPY;  Service: Cardiovascular;  Laterality: N/A;    Past Medical History:  Diagnosis Date   Anemia, chronic disease 05/26/2018   Formatting of this note might be different from the original. Eval with Lewis and phlebotomy for Hemochromotosis.   Anticoagulant long-term use 11/26/2016   Arthritis of right foot 05/08/2016   Atypical chest pain 09/13/2020   Bacteremia due to group B Streptococcus 11/03/2020   Benign hypertension with CKD (chronic kidney disease) stage III (HCC) 07/12/2019   Bilateral leg edema    Cellulitis 11/26/2020   Chest pain at rest 08/16/2020   CHF (congestive heart failure) (HCC) 03/05/2012   COVID-19 virus infection 02/09/2020   Deformity of toe  08/01/2020   Diabetic ulcer of toe of right foot associated with type 2 diabetes mellitus, with fat layer exposed (HCC) 01/10/2020   Formatting of this note might be different from the original. Distal tuft right fifth toe   Dyslipidemia 03/05/2012   Essential hypertension 11/26/2016   GERD (gastroesophageal reflux disease) 08/01/2020   Gouty arthritis of left hand 01/15/2017   High risk medication use 11/26/2016   History of COVID-19 05/05/2020   Formatting of this note might be different from the original. 02/2020   History of diabetic ulcer of foot 04/04/2021   History of kidney stones    History of pulmonary embolism 11/26/2016   Formatting of this note might be different from the original. Felt severe with permanent anticoagulation and Filter placed because of burden.   Hypertension    Kidney failure    stage 4   Localized edema 03/05/2012   LVH (left ventricular hypertrophy) 05/26/2018   Formatting of this note might be different from the original. 04/2018 EF 555-60%   Malaise and fatigue 11/26/2016   Medication monitoring encounter 11/15/2020   Metabolic acidosis 01/21/2022   MGUS (monoclonal gammopathy of unknown significance) 06/03/2018   Formatting of this note might be different from the original. Possible. Seen on SPEP 05/2018   Mild persistent asthma 11/26/2016   Mixed hyperlipidemia 11/26/2016   Morbid (severe) obesity due to excess calories (HCC) 03/05/2012   Morbid obesity with body mass index (BMI) of 40.0 to 49.9 (HCC) 11/26/2016   Nail dystrophy 05/20/2018   Nephrolithiasis 03/05/2012   Nonalcoholic steatohepatitis (NASH) 05/26/2018   Obesity, Class III, BMI 40-49.9 (morbid obesity) (HCC) 11/26/2016   Other hemochromatosis 03/21/2020   PICC (peripherally inserted central catheter) in place 12/12/2020   Posterior tibial tendinitis of right lower extremity 08/01/2020   Pre-ulcerative calluses 06/18/2019   Primary central sleep apnea 11/26/2016   Formatting of  this note might be different from the original. bipap   Sepsis (HCC) 10/30/2020   Sepsis due to cellulitis (HCC) 10/30/2020   Sleep apnea 03/05/2012   Formatting of this note might be different from the original. [place].   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 IMO 10/01 Updates  Formatting of this note might be different from the original. 2000   Ulcer of right foot with fat layer exposed (HCC) 12/05/2020    Medications:  I have reviewed the patient's current medications.  Medications Prior to Admission  Medication Sig Dispense Refill   acetaminophen (TYLENOL) 500 MG tablet Take 500 mg by mouth every 6 (six) hours as needed for moderate  pain or mild pain.     apixaban (ELIQUIS) 5 MG TABS tablet Take 5 mg by mouth 2 (two) times daily.     atorvastatin (LIPITOR) 10 MG tablet Take 10 mg by mouth at bedtime.     bisacodyl (DULCOLAX) 5 MG EC tablet Take 5 mg by mouth daily as needed for moderate constipation.     carvedilol (COREG) 25 MG tablet Take 1 tablet (25 mg total) by mouth 2 (two) times daily with a meal. (Patient taking differently: Take 50 mg by mouth 2 (two) times daily with a meal. Does not take on Dialysis days Mon., Wed. and Friday in the morning) 90 tablet 1   HUMALOG KWIKPEN 200 UNIT/ML KwikPen Inject 30 Units into the skin 3 (three) times daily with meals.     insulin degludec (TRESIBA) 200 UNIT/ML FlexTouch Pen Inject 100 Units into the skin daily.     Methoxy PEG-Epoetin Beta (MIRCERA IJ) Mircera     nitroGLYCERIN (NITROSTAT) 0.4 MG SL tablet Place 0.4 mg under the tongue every 5 (five) minutes as needed for chest pain.     OZEMPIC, 0.25 OR 0.5 MG/DOSE, 2  MG/3ML SOPN Inject 0.5 mg into the skin once a week.     promethazine (PHENERGAN) 25 MG tablet Take 25 mg by mouth every 6 (six) hours as needed for nausea or vomiting.     sucroferric oxyhydroxide (VELPHORO) 500 MG chewable tablet Chew 1,500 mg by mouth 3 (three) times daily with meals.     Tenapanor HCl, CKD, (XPHOZAH) 30 MG TABS Take 30 mg by mouth 2 (two) times daily.     torsemide (DEMADEX) 100 MG tablet Take 100 mg by mouth daily.      ALLERGIES:  No Known Allergies  FAM HX: Family History  Problem Relation Age of Onset   Cancer Mother    Clotting disorder Mother    Stroke Mother    Alzheimer's disease Mother    Diabetes Mother    Diabetes Father     Social History:   reports that he quit smoking about 39 years ago. His smoking use included cigarettes. He started smoking about 40 years ago. He has never used smokeless tobacco. He reports that he does not currently use alcohol. He reports that he does not currently use drugs.  ROS: 12 system relevant ROS per HPI above  Blood pressure 139/75, pulse 80, resp. rate 14, SpO2 97%. PHYSICAL EXAM: Gen: morbidly obese man who is comfortable  Eyes: anicteric, EOMI, glasses WUJ:WJXBJ 2 airway CV:  NSR Lungs: clear on RA Extr: RUE AVF no bruit Neuro: non focal    No results found for this or any previous visit (from the past 48 hours).  No results found.   Ethelene Hal 08/28/2023, 10:51 AM

## 2023-08-28 NOTE — Discharge Instructions (Signed)

## 2023-08-28 NOTE — Op Note (Signed)
 Patient referred for a thrombectomy of his right  upper arm BCF placed May 17, 2022 but requiring numerous further interventions including elevation with branch ligation, BAM May 2024, drug-eluting balloon in Oct 21, 2022 as well as February 17, 2023.  He was also just here with outflow angioplasty by Dr. Glenna Fellows just over a month ago.  On examination there is no pulse or bruit in the right upper BCF.   Summary:  1) Successful thrombectomy of a right upper arm BCF with evidence of 70-80% mid cephalic, outflow cephalic corrected with angioplasty (8x8 Mustang FE ~15 ATM) + 9x6 DEB Lutonix; 70% inflow limb cephalic vein stenosis treated with a 7 x 4 followed by 8 x 4 Mustang.  I believe the problematic lesion was mainly the inflow lesion but unfortunately despite numerous inflations with the balloon flows were marginal.  Sometimes very good and often times very sluggish; convinced that this will stay open..  Planning for a tunneled catheter on Friday given we had to give him systemic heparin today. 2) Arterial anastomosis patent mild juxta lesion treated with a 7 mm balloon as well. 3) there was a arch lesion not at the confluence, treated with a 8 mm balloon fully effaced at low atmospheres and may be spasm.  Did not want to stent that as the main lesion was the inflow cephalic vein stenotic segment.   4) This right cephalic fistula remains amenable to limited future percutaneous intervention.  If he thromboses overnight I would not attempt another declot  Description of procedure: The right arm was prepped and draped in the usual fashion. The right upper arm cephalic was first cannulated (13086) in the arterial limb with a 21-gauge mini puncture needle in an antegrade direction and then a-5Fr stylette followed by 7Fr sheath was inserted by guidewire exchange technique. A 0.035 guidewire was passed through the clotted graft into the central veins under fluoroscopic guidance and then a 5Fr diagnostic  catheter inserted over the wire. The wire was removed, and then intravenous heparin and sedative drugs administered. A pullback angiogram was performed by injecting contrast (V7846) via the diagnostic catheter. This showed patent central veins, 70% outflow cephalic vein stenosis adjacent to the cannulation zone, 70% mid fistula stenosis and evidence of filling defects in the graft consistent with thrombus.  Three passes were made with the 7Fr [facility] 1 catheter from the axillary vein to the antegrade sheath to physically remove thrombus and perform the thrombectomy (96295). The catheter was removed, and an 8 x 8 Mustang angioplasty balloon was inserted over the wire to the level of the outflow and mid-cephalic lesion followed by venous angioplasty (28413) carried out to 15 ATM at only the site of stenosis to FULL effacement and then more gently in the rest of the graft circuit to macerate the thrombus.  Only gently inflated the 8 mm balloon at the arch with only 8 atm of pressure with 90% effacement to macerate thrombus.  In order to remove this clot and remove the platelet plug at the arterial anastomosis, a second cannulation (24401) was required in a retrograde direction. A 7Fr sheath was inserted by guidewire exchange technique. A 4Fr Fogarty catheter was inserted through the sheath and advanced into the proximal brachial artery. The over the wire embolectomy balloon was inflated with 0.6cc of contrast and then pulled across the arterial anastomosis into the graft with aspiration of the sheath via the side port to remove the platelet plug and thrombus. [phone]). This step was repeated to  sweep all residual clot from this side of the graft. In order to check the arterial inflow an arteriogram was necessary because refluxing contrast from the fistula would have caused risk of embolizing thrombus from the fistula into the arterial vasculature. A glidewire and straight catheter were inserted into the proximal  brachial artery and an arteriogram performed by parking the tip of the catheter 2cm proximal to the arterial anastomosis. The arteriogram documented a good calibered proximal and distal brachial artery with slow distal runoff and no evidence of distal emboli.   Follow up angiogram showed poor blood flow through the fistula circuit with <50% juxta anastomotic stenosis and 50 to 60% focal inflow limb cephalic vein stenosis.  Because we cannot establish consistent good flows through the fistula we treated the inflow limb cephalic vein stenosis with a 7 x 4 Mustang followed by 8 x 4 Mustang for prolonged inflation.   We decided to use a drug-eluting balloon because the patient has been requiring multiple angioplasties with secondary patency only approximately 1 to -2 and half months cannulation zone and outflow cephalic vein.  A 0.035 hydrophilic 260 cm glidewire was then inserted through the sheath and parked in the axillary artery. A 9x6 Lutonix DEB angioplasty balloon was then inserted over the guidewire and positioned at the mid + outflow cephalic vein stenosis.   Venous angioplasty (40981) was carried out to 8 ATM with FULL effacement of the waist on the balloon at the basilic outflow swing site lesion for 3 minutes. The repeat angiogram showed 10% residual stenosis at the outflow cephalic vein site with no evidence of extravasation or dissection.  Of note reestablish flow but we cannot maintain great flows.  Main lesion on physical exam was the inflow limb cephalic vein stenosis, not the juxta lesion.  We tried multiple maneuvers including external maceration against the balloon, 7 x 4 followed by 8 x 4 Mustang balloon angioplasty.  We even tried prolonged inflation but unfortunately could not establish great flows consistently.  Hemostasis: A 3-0 ethilon purse string suture was placed at the cannulation site on removal of the sheath.  Sedation: 1 mg Versed, 25 mcg Fentanyl. Sedation time. 51   minutes  Contrast. 20 mL  Monitoring: Because of the patient's comorbid conditions and sedation during the procedure, continuous EKG monitoring and O2 saturation monitoring was performed throughout the procedure by the RN. There were no abnormal arrhythmias encountered.  Complications: None.   Diagnoses: N18.6 End stage renal disease  T82.858A Stenosis of vascular prosthetic devices, implants and grafts, initial encounter T82.868A Thrombosis of vascular prosthetic device/graft, initial  Procedure Coding:  [phone] Cannulation and angiogram of fistula/ graft, thrombectomy and venous angioplasty W2956 Contrast  Recommendations:  1. Continue to cannulate the graft with 15G needles.  2. Refer back for problems with flows. 3. Remove the sutures next treatment.   Discharge: The patient was discharged home in stable condition. The patient was given education regarding the care of the dialysis access AVF and specific instructions in case of any problems.

## 2023-08-29 ENCOUNTER — Ambulatory Visit (HOSPITAL_COMMUNITY)
Admission: RE | Admit: 2023-08-29 | Discharge: 2023-08-29 | Disposition: A | Discharge status: Home / Self Care | Source: Ambulatory Visit | Attending: Nephrology | Admitting: Nephrology

## 2023-08-29 ENCOUNTER — Encounter (HOSPITAL_COMMUNITY)
Admission: RE | Disposition: A | Discharge status: Home / Self Care | Payer: Self-pay | Source: Ambulatory Visit | Attending: Nephrology

## 2023-08-29 ENCOUNTER — Other Ambulatory Visit: Payer: Self-pay

## 2023-08-29 ENCOUNTER — Encounter (HOSPITAL_COMMUNITY): Payer: Self-pay | Admitting: Nephrology

## 2023-08-29 DIAGNOSIS — Z7901 Long term (current) use of anticoagulants: Secondary | ICD-10-CM | POA: Insufficient documentation

## 2023-08-29 DIAGNOSIS — E1122 Type 2 diabetes mellitus with diabetic chronic kidney disease: Secondary | ICD-10-CM | POA: Insufficient documentation

## 2023-08-29 DIAGNOSIS — I132 Hypertensive heart and chronic kidney disease with heart failure and with stage 5 chronic kidney disease, or end stage renal disease: Secondary | ICD-10-CM | POA: Insufficient documentation

## 2023-08-29 DIAGNOSIS — N186 End stage renal disease: Secondary | ICD-10-CM | POA: Diagnosis present

## 2023-08-29 DIAGNOSIS — Z87891 Personal history of nicotine dependence: Secondary | ICD-10-CM | POA: Diagnosis not present

## 2023-08-29 DIAGNOSIS — Z992 Dependence on renal dialysis: Secondary | ICD-10-CM | POA: Insufficient documentation

## 2023-08-29 DIAGNOSIS — Z7985 Long-term (current) use of injectable non-insulin antidiabetic drugs: Secondary | ICD-10-CM | POA: Insufficient documentation

## 2023-08-29 DIAGNOSIS — I509 Heart failure, unspecified: Secondary | ICD-10-CM | POA: Insufficient documentation

## 2023-08-29 DIAGNOSIS — Z794 Long term (current) use of insulin: Secondary | ICD-10-CM | POA: Insufficient documentation

## 2023-08-29 DIAGNOSIS — Z833 Family history of diabetes mellitus: Secondary | ICD-10-CM | POA: Diagnosis not present

## 2023-08-29 HISTORY — PX: DIALYSIS/PERMA CATHETER INSERTION: CATH118288

## 2023-08-29 SURGERY — DIALYSIS/PERMA CATHETER INSERTION
Anesthesia: LOCAL | Laterality: Right

## 2023-08-29 MED ORDER — LIDOCAINE HCL (PF) 1 % IJ SOLN
INTRAMUSCULAR | Status: DC | PRN
  Administered 2023-08-29: 2 mL via SUBCUTANEOUS
  Administered 2023-08-29: 10 mL via SUBCUTANEOUS

## 2023-08-29 MED ORDER — HEPARIN (PORCINE) IN NACL 1000-0.9 UT/500ML-% IV SOLN
INTRAVENOUS | Status: DC | PRN
  Administered 2023-08-29: 500 mL

## 2023-08-29 MED ORDER — SODIUM CHLORIDE 0.9 % IV SOLN
INTRAVENOUS | Status: DC
Start: 2023-08-29 — End: 2023-08-29

## 2023-08-29 MED ORDER — FENTANYL CITRATE (PF) 100 MCG/2ML IJ SOLN
INTRAMUSCULAR | Status: AC
Start: 2023-08-29 — End: ?
  Filled 2023-08-29: qty 2

## 2023-08-29 MED ORDER — MIDAZOLAM HCL 2 MG/2ML IJ SOLN
INTRAMUSCULAR | Status: DC | PRN
  Administered 2023-08-29: 2 mg via INTRAVENOUS

## 2023-08-29 MED ORDER — HEPARIN SODIUM (PORCINE) 1000 UNIT/ML IJ SOLN
INTRAMUSCULAR | Status: DC | PRN
  Administered 2023-08-29: 3800 [IU] via INTRAVENOUS

## 2023-08-29 MED ORDER — IODIXANOL 320 MG/ML IV SOLN
INTRAVENOUS | Status: DC | PRN
  Administered 2023-08-29: 5 mL via INTRAVENOUS

## 2023-08-29 MED ORDER — LIDOCAINE HCL (PF) 1 % IJ SOLN
INTRAMUSCULAR | Status: AC
  Filled 2023-08-29: qty 30

## 2023-08-29 MED ORDER — FENTANYL CITRATE (PF) 100 MCG/2ML IJ SOLN
INTRAMUSCULAR | Status: DC | PRN
  Administered 2023-08-29: 50 ug via INTRAVENOUS

## 2023-08-29 MED ORDER — MIDAZOLAM HCL 2 MG/2ML IJ SOLN
INTRAMUSCULAR | Status: AC
  Filled 2023-08-29: qty 2

## 2023-08-29 SURGICAL SUPPLY — 9 items
BAG SNAP BAND KOVER 36X36 (MISCELLANEOUS) IMPLANT
CATH PALINDROME-P 23 W/VT (CATHETERS) IMPLANT
CATH SLIP KMP 65CM 5FR (CATHETERS) IMPLANT
COVER DOME SNAP 22 D (MISCELLANEOUS) IMPLANT
GLIDEWIRE STIFF .35X180X3 HYDR (WIRE) IMPLANT
KIT MICROPUNCTURE NIT STIFF (SHEATH) IMPLANT
SHEATH PINNACLE 7F 10CM (SHEATH) IMPLANT
SHEATH PROBE COVER 6X72 (BAG) IMPLANT
TRAY PV CATH (CUSTOM PROCEDURE TRAY) ×1 IMPLANT

## 2023-08-29 NOTE — Interval H&P Note (Signed)
 History and Physical Interval Note:  08/29/2023 10:42 AM  Sean Valentine  has presented today for placement of a tunneled hemodialysis catheter after unsuccessful thrombectomy yesterday and the need to continue maintenance hemodialysis.  The various methods of treatment have been discussed with the patient and after consideration of risks, benefits and other options for treatment, the patient has consented to  Procedure(s): DIALYSIS/PERMA CATHETER INSERTION (N/A) as a surgical intervention.  The patient's history has been reviewed, patient examined, no change in status, stable for surgery.  I have reviewed the patient's chart and labs.  Questions were answered to the patient's satisfaction.     Dagoberto Ligas

## 2023-08-29 NOTE — Op Note (Signed)
 Patient presents for tunneled catheter insertion to continue dialysis after an unsuccessful thrombectomy of his right brachiocephalic fistula yesterday. Ultrasound shows a medium sized patent right IJ vein just lateral to his carotid. The decision was made to place a RIJ tunneled dialysis catheter.  Summary:  1) Successful placement of a new 23 cm cuff to tip hemodialysis catheter (Palindrome) in the right internal jugular vein with the tip in the right atrium.  2) Recommendations to the dialysis unit TO NOT USE HEPARIN FOR THE FIRST 24 HOURS.  Description of procedure: The right neck, chest and the catheter were prepped and draped in the usual sterile fashion.  Local anesthesia was provided by injecting lidocaine 1% at the neck site overlying the desired venotomy site. Using real time ultrasound guidance, I was able to cannulate the RIJ and advance the guidewire easily into the central circulation. The wire was then manipulated and advanced into the abdominal IVC. I then blunt dissected the tissue around the 5 Fr stylet until it was freely mobile. Then, after injecting local anesthesia into the desired track and exit site I made a small incision at the exit site and tunneled a 23 cm cuff to tip Palindrome dialysis catheter, pulling it out at the venotomy site. Sequential dilation with 12, 14, and finally the [place]elaway sheath were done with real time fluoroscopic guidance ensuring that we were straight always lined with the wire.   Then, I inserted the catheter over the wire and through the sheath. After removing the peelaway sheath, I adjusted the catheter until the tip of the catheter was positioned in the right atrium of the heart. The cuff of the catheter was positioned in the subcutaneous tunnel with the cuff approximately 2 cm from the exit site.   Both limbs of the catheter were aspirated and flushed with excellent flow noted. Both limbs of the catheter were locked with heparin and sterile caps  placed. The hub of the catheter was sutured to the chest wall with 2-0 nylon suture.   The neck incision was closed with a vertical mattress 3-0 plain gut sutures and a purse-string 3-0 plain gut was placed at the tunnel exit site. 2-0 loose nylon sutures secured the hub to the chest wall.                         Sterile dressings were placed, and the patient returned to recovery in stable condition.  Sedation: 2 mg Versed, 25 mcg Fentanyl. Sedation time: 30 minutes  Contrast 5 mL  Monitoring: Because of the patient's comorbid conditions and sedation during the procedure, continuous EKG monitoring and O2 saturation monitoring was performed throughout the procedure by the RN. There were no abnormal arrhythmias encountered.  Complications: None.  Diagnoses:   N18.6 End stage renal disease Z99.2 Dependence on renal dialysis  Procedures Coding:  36558 Tunneled catheter insertion [id number] Ultrasound guidance [id number]  Fluoroscopy guidance for catheter insertion. Z3086 Contrast  Recommendations: Remove the suture in 3 weeks. 2.   Report any blood flow problems to [facility].  Discharge: The patient was discharged home in stable condition. The patient was given education regarding the care of the catheter and specific instructions in case of any problems.

## 2023-09-01 ENCOUNTER — Encounter (HOSPITAL_COMMUNITY): Payer: Self-pay | Admitting: Nephrology

## 2023-10-06 ENCOUNTER — Other Ambulatory Visit: Payer: Self-pay

## 2023-10-06 DIAGNOSIS — N186 End stage renal disease: Secondary | ICD-10-CM

## 2023-10-14 ENCOUNTER — Ambulatory Visit (HOSPITAL_COMMUNITY)
Admission: RE | Admit: 2023-10-14 | Discharge: 2023-10-14 | Disposition: A | Discharge status: Home / Self Care | Source: Ambulatory Visit | Attending: Surgery | Admitting: Surgery

## 2023-10-14 ENCOUNTER — Ambulatory Visit (HOSPITAL_BASED_OUTPATIENT_CLINIC_OR_DEPARTMENT_OTHER)
Admission: RE | Admit: 2023-10-14 | Discharge: 2023-10-14 | Disposition: A | Discharge status: Home / Self Care | Source: Ambulatory Visit | Attending: Vascular Surgery | Admitting: Vascular Surgery

## 2023-10-14 DIAGNOSIS — N186 End stage renal disease: Secondary | ICD-10-CM | POA: Insufficient documentation

## 2023-11-05 ENCOUNTER — Ambulatory Visit: Attending: Vascular Surgery | Admitting: Vascular Surgery

## 2023-11-05 ENCOUNTER — Encounter: Payer: Self-pay | Admitting: Vascular Surgery

## 2023-11-05 VITALS — BP 125/84 | HR 93 | Temp 98.2°F | Ht 77.0 in | Wt 335.0 lb

## 2023-11-05 DIAGNOSIS — N186 End stage renal disease: Secondary | ICD-10-CM | POA: Insufficient documentation

## 2023-11-05 NOTE — Progress Notes (Signed)
 Patient ID: Sean Valentine, male   DOB: 02-07-1966, 58 y.o.   MRN: [id number]  Reason for Consult: Follow-up   Referred by Charlynne Coombes, PA-C  Subjective:     HPI:  Sean Valentine is a 58 y.o. male end-stage renal disease on dialysis via tunneled dialysis catheter.  He has a previous two-stage cephalic vein fistula on the right and is left-hand dominant.  He does not have any previous access procedures on the left side.  He dialyzes Mondays Wednesdays and Fridays and also takes Eliquis.  Past Medical History:  Diagnosis Date   Anemia, chronic disease 05/26/2018   Formatting of this note might be different from the original. Eval with Lewis and phlebotomy for Hemochromotosis.   Anticoagulant long-term use 11/26/2016   Arthritis of right foot 05/08/2016   Atypical chest pain 09/13/2020   Bacteremia due to group B Streptococcus 11/03/2020   Benign hypertension with CKD (chronic kidney disease) stage III (HCC) 07/12/2019   Bilateral leg edema    Cellulitis 11/26/2020   Chest pain at rest 08/16/2020   CHF (congestive heart failure) (HCC) 03/05/2012   COVID-19 virus infection 02/09/2020   Deformity of toe 08/01/2020   Diabetic ulcer of toe of right foot associated with type 2 diabetes mellitus, with fat layer exposed (HCC) 01/10/2020   Formatting of this note might be different from the original. Distal tuft right fifth toe   Dyslipidemia 03/05/2012   Essential hypertension 11/26/2016   GERD (gastroesophageal reflux disease) 08/01/2020   Gouty arthritis of left hand 01/15/2017   High risk medication use 11/26/2016   History of COVID-19 05/05/2020   Formatting of this note might be different from the original. 02/2020   History of diabetic ulcer of foot 04/04/2021   History of kidney stones    History of pulmonary embolism 11/26/2016   Formatting of this note might be different from the original. Felt severe with permanent anticoagulation and Filter placed because of burden.    Hypertension    Kidney failure    stage 4   Localized edema 03/05/2012   LVH (left ventricular hypertrophy) 05/26/2018   Formatting of this note might be different from the original. 04/2018 EF 555-60%   Malaise and fatigue 11/26/2016   Medication monitoring encounter 11/15/2020   Metabolic acidosis 01/21/2022   MGUS (monoclonal gammopathy of unknown significance) 06/03/2018   Formatting of this note might be different from the original. Possible. Seen on SPEP 05/2018   Mild persistent asthma 11/26/2016   Mixed hyperlipidemia 11/26/2016   Morbid (severe) obesity due to excess calories (HCC) 03/05/2012   Morbid obesity with body mass index (BMI) of 40.0 to 49.9 (HCC) 11/26/2016   Nail dystrophy 05/20/2018   Nephrolithiasis 03/05/2012   Nonalcoholic steatohepatitis (NASH) 05/26/2018   Obesity, Class III, BMI 40-49.9 (morbid obesity) 11/26/2016   Other hemochromatosis 03/21/2020   PICC (peripherally inserted central catheter) in place 12/12/2020   Posterior tibial tendinitis of right lower extremity 08/01/2020   Pre-ulcerative calluses 06/18/2019   Primary central sleep apnea 11/26/2016   Formatting of this note might be different from the original. bipap   Sepsis (HCC) 10/30/2020   Sepsis due to cellulitis (HCC) 10/30/2020   Sleep apnea 03/05/2012   Formatting of this note might be different from the original. [place].   Spinal stenosis of lumbar region 05/26/2018   Status post amputation of toe of right foot (HCC) 07/26/2015   Subfibular impingement, right 03/13/2016   Tinea pedis 08/01/2020   Type 2 diabetes mellitus  with stage 3b chronic kidney disease, with long-term current use of insulin  (HCC) 07/12/2019   Formatting of this note might be different from the original. IMO 10/01 Updates  Formatting of this note might be different from the original. 2000   Ulcer of right foot with fat layer exposed (HCC) 12/05/2020   Family History  Problem Relation Age of Onset   Cancer Mother     Clotting disorder Mother    Stroke Mother    Alzheimer's disease Mother    Diabetes Mother    Diabetes Father    Past Surgical History:  Procedure Laterality Date   A/V FISTULAGRAM Right 07/22/2022   Procedure: A/V Fistulagram;  Surgeon: Adine Hoof, MD;  Location: [facility] INVASIVE CV LAB;  Service: Cardiovascular;  Laterality: Right;   A/V FISTULAGRAM Right 10/21/2022   Procedure: A/V Fistulagram;  Surgeon: Adine Hoof, MD;  Location: [facility] INVASIVE CV LAB;  Service: Cardiovascular;  Laterality: Right;   A/V FISTULAGRAM Right 02/17/2023   Procedure: A/V Fistulagram;  Surgeon: Dannis Dy, MD;  Location: [facility] INVASIVE CV LAB;  Service: Cardiovascular;  Laterality: Right;   A/V FISTULAGRAM N/A 07/08/2023   Procedure: A/V Fistulagram;  Surgeon: Baron Border, MD;  Location: [facility] INVASIVE CV LAB;  Service: Cardiovascular;  Laterality: N/A;   AV FISTULA PLACEMENT Right 05/17/2022   Procedure: RIGHT BRACHIOCEPHALIC ARTERIOVENOUS (AV) FISTULA CREATION;  Surgeon: Adine Hoof, MD;  Location: [facility] OR;  Service: Vascular;  Laterality: Right;  regional with MAC   BUBBLE STUDY  11/03/2020   Procedure: BUBBLE STUDY;  Surgeon: Harrold Lincoln, MD;  Location: [facility] ENDOSCOPY;  Service: Cardiovascular;;   CATARACT EXTRACTION, BILATERAL Bilateral    CHOLECYSTECTOMY     CYSTOSCOPY W/ URETERAL STENT PLACEMENT Left 05/06/2005   DIALYSIS/PERMA CATHETER INSERTION     DIALYSIS/PERMA CATHETER INSERTION Right 08/29/2023   Procedure: DIALYSIS/PERMA CATHETER INSERTION;  Surgeon: Melodie Spry, MD;  Location: [facility] INVASIVE CV LAB;  Service: Cardiovascular;  Laterality: Right;   EYE SURGERY     FOOT SURGERY Right    Big toe amputation   IVC FILTER INSERTION  08/2016   KIDNEY STONE SURGERY     PERIPHERAL VASCULAR BALLOON ANGIOPLASTY  10/21/2022   Procedure: PERIPHERAL VASCULAR BALLOON ANGIOPLASTY;  Surgeon: Adine Hoof, MD;  Location: [facility] INVASIVE CV LAB;   Service: Cardiovascular;;  R AV fistula   PERIPHERAL VASCULAR BALLOON ANGIOPLASTY Right 02/17/2023   Procedure: PERIPHERAL VASCULAR BALLOON ANGIOPLASTY;  Surgeon: Dannis Dy, MD;  Location: [facility] INVASIVE CV LAB;  Service: Cardiovascular;  Laterality: Right;  rt arrm fistula w/DCB   PERIPHERAL VASCULAR BALLOON ANGIOPLASTY  07/08/2023   Procedure: PERIPHERAL VASCULAR BALLOON ANGIOPLASTY;  Surgeon: Baron Border, MD;  Location: [facility] INVASIVE CV LAB;  Service: Cardiovascular;;  Cephalic Vein   PERIPHERAL VASCULAR THROMBECTOMY Right 08/28/2023   Procedure: PERIPHERAL VASCULAR THROMBECTOMY;  Surgeon: Patrick Boor, MD;  Location: [facility] INVASIVE CV LAB;  Service: Cardiovascular;  Laterality: Right;   REVISON OF ARTERIOVENOUS FISTULA Right 08/02/2022   Procedure: REVISION OF RIGHT ARM FISTULA WITH TRANSPOSITION AND BRANCH LIGATION;  Surgeon: Adine Hoof, MD;  Location: [facility] OR;  Service: Vascular;  Laterality: Right;  Regional Block   SHOULDER SURGERY Left    TEE WITHOUT CARDIOVERSION N/A 11/03/2020   Procedure: TRANSESOPHAGEAL ECHOCARDIOGRAM (TEE);  Surgeon: Harrold Lincoln, MD;  Location: [facility] ENDOSCOPY;  Service: Cardiovascular;  Laterality: N/A;   VENOUS ANGIOPLASTY Right 08/28/2023   Procedure: VENOUS ANGIOPLASTY;  Surgeon: Patrick Boor, MD;  Location: [facility] INVASIVE CV LAB;  Service: Cardiovascular;  Laterality: Right;  70% Body of AVF x2    Short Social History:  Social History   Tobacco Use   Smoking status: Former    Current packs/day: 0.00    Types: Cigarettes    Start date: 1985    Quit date: 1986    Years since quitting: 39.4   Smokeless tobacco: Never  Substance Use Topics   Alcohol use: Not Currently    No Known Allergies  Current Outpatient Medications  Medication Sig Dispense Refill   acetaminophen  (TYLENOL ) 500 MG tablet Take 500 mg by mouth every 6 (six) hours as needed for moderate pain or mild pain.     apixaban (ELIQUIS) 5 MG TABS tablet Take 5 mg by  mouth 2 (two) times daily.     atorvastatin (LIPITOR) 10 MG tablet Take 10 mg by mouth at bedtime.     bisacodyl (DULCOLAX) 5 MG EC tablet Take 5 mg by mouth daily as needed for moderate constipation.     carvedilol  (COREG ) 25 MG tablet Take 1 tablet (25 mg total) by mouth 2 (two) times daily with a meal. (Patient taking differently: Take 50 mg by mouth 2 (two) times daily with a meal. Does not take on Dialysis days Mon., Wed. and Friday in the morning take in evening on) 90 tablet 1   HUMALOG KWIKPEN 200 UNIT/ML KwikPen Inject 150 Units into the skin daily.     insulin  degludec (TRESIBA) 200 UNIT/ML FlexTouch Pen Inject 60 Units into the skin 2 (two) times daily.     Methoxy PEG-Epoetin Beta (MIRCERA IJ) Mircera     nitroGLYCERIN  (NITROSTAT ) 0.4 MG SL tablet Place 0.4 mg under the tongue every 5 (five) minutes as needed for chest pain.     OZEMPIC, 0.25 OR 0.5 MG/DOSE, 2 MG/3ML SOPN Inject 0.25 mg into the skin once a week.     promethazine (PHENERGAN) 25 MG tablet Take 25 mg by mouth every 6 (six) hours as needed for nausea or vomiting.     sucroferric oxyhydroxide (VELPHORO) 500 MG chewable tablet Chew 500 mg by mouth 3 (three) times daily with meals.     torsemide  (DEMADEX ) 100 MG tablet Take 100 mg by mouth daily.     No current facility-administered medications for this visit.    Review of Systems  Constitutional:  Constitutional negative. HENT: HENT negative.  Eyes: Eyes negative.  Respiratory: Respiratory negative.  Cardiovascular: Cardiovascular negative.  GI: Gastrointestinal negative.  Musculoskeletal: Musculoskeletal negative.  Skin: Skin negative.  Hematologic: Hematologic/lymphatic negative.  Psychiatric: Psychiatric negative.        Objective:  Objective   Vitals:   11/05/23 1434  BP: 125/84  Pulse: 93  Temp: 98.2 F (36.8 C)  SpO2: 94%  Weight: (!) 335 lb (152 kg)  Height: 6\' 5"  (1.956 m)   Body mass index is 39.73 kg/m.  Physical Exam HENT:     Head:  Normocephalic.     Nose: Nose normal.  Eyes:     Pupils: Pupils are equal, round, and reactive to light.  Cardiovascular:     Rate and Rhythm: Normal rate.  Pulmonary:     Effort: Pulmonary effort is normal.  Abdominal:     General: Abdomen is flat.  Musculoskeletal:     Comments: Thrombosed right upper extremity fistula  Neurological:     General: No focal deficit present.     Mental Status: He is alert.     Data: +-----------------+-------------+----------+----------+  Right Cephalic   Diameter (cm)Depth (cm) Findings   +-----------------+-------------+----------+----------+  Shoulder                               thrombosed  +-----------------+-------------+----------+----------+  Prox upper arm                          thrombosed  +-----------------+-------------+----------+----------+  Mid upper arm                           thrombosed  +-----------------+-------------+----------+----------+  Dist upper arm                          thrombosed  +-----------------+-------------+----------+----------+  Antecubital fossa                       thrombosed  +-----------------+-------------+----------+----------+   +-----------------+-------------+----------+---------+  Right Basilic    Diameter (cm)Depth (cm)Findings   +-----------------+-------------+----------+---------+  Shoulder            0.61        3.42              +-----------------+-------------+----------+---------+  Prox upper arm       0.55        2.95              +-----------------+-------------+----------+---------+  Mid upper arm        0.53        2.48              +-----------------+-------------+----------+---------+  Dist upper arm       0.66        0.94              +-----------------+-------------+----------+---------+  Antecubital fossa    0.42        0.56   branching  +-----------------+-------------+----------+---------+  Prox forearm          0.30        0.34              +-----------------+-------------+----------+---------+  Mid forearm          0.31        0.70              +-----------------+-------------+----------+---------+  Wrist               0.17        0.24              +-----------------+-------------+----------+---------+   +-----------------+-------------+----------+---------+  Left Cephalic    Diameter (cm)Depth (cm)Findings   +-----------------+-------------+----------+---------+  Shoulder            0.50        2.11              +-----------------+-------------+----------+---------+  Prox upper arm       0.47        1.37              +-----------------+-------------+----------+---------+  Mid upper arm        0.47        0.57   branching  +-----------------+-------------+----------+---------+  Dist upper arm       0.37        0.37              +-----------------+-------------+----------+---------+  Antecubital fossa    0.55  0.62              +-----------------+-------------+----------+---------+  Prox forearm         0.34        0.19              +-----------------+-------------+----------+---------+  Mid forearm          0.29        0.39              +-----------------+-------------+----------+---------+  Wrist               0.21        0.27              +-----------------+-------------+----------+---------+   +-----------------+-------------+----------+--------+  Left Basilic     Diameter (cm)Depth (cm)Findings  +-----------------+-------------+----------+--------+  Shoulder            0.50        2.57             +-----------------+-------------+----------+--------+  Prox upper arm       0.31        2.06             +-----------------+-------------+----------+--------+  Mid upper arm        0.38        1.40             +-----------------+-------------+----------+--------+  Dist upper arm       0.34         0.56             +-----------------+-------------+----------+--------+  Antecubital fossa    0.27        0.46             +-----------------+-------------+----------+--------+  Prox forearm         0.21        0.50             +-----------------+-------------+----------+--------+  Mid forearm          0.15        0.25             +-----------------+-------------+----------+--------+  Wrist               0.11        0.25             +-----------------+-------------+----------+--------+    Right Pre-Dialysis Findings:  +-----------------------+----------+--------------------+-----------+------  --+  Location              PSV (cm/s)Intralum. Diam. (cm)Waveform    Comments  +-----------------------+----------+--------------------+-----------+------  --+  Brachial Antecub. fossa101       0.55                triphasic             +-----------------------+----------+--------------------+-----------+------  --+  Radial Art at Wrist    114       0.24                multiphasic           +-----------------------+----------+--------------------+-----------+------  --+  Ulnar Art at Wrist     112       0.24                multiphasic           +-----------------------+----------+--------------------+-----------+------  --+    Left Pre-Dialysis Findings:  +-----------------------+----------+--------------------+---------+--------  +  Location              PSV (  cm/s)Intralum. Diam. (cm)Waveform  Comments  +-----------------------+----------+--------------------+---------+--------  +  Brachial Antecub. fossa83        0.57                triphasic           +-----------------------+----------+--------------------+---------+--------  +  Radial Art at Wrist    94        0.28                triphasic           +-----------------------+----------+--------------------+---------+--------  +  Ulnar Art at Wrist     112        0.24                triphasic           +-----------------------+----------+--------------------+---------+--------  +    Summary:    Right: No obstruction visualized in the right upper extremity.  Left: No obstruction visualized in the left upper extremity.  *See table(s) above for measurements and observations.       Assessment/Plan:    58 year old male with end-stage renal disease currently dialyzing via catheter.  He has a thrombosed right upper extremity fistula.  We are planning for new access which will either be ligated distally versus graft.  This will be done on a nondialysis day in the near future he will need to hold Eliquis 48 hours prior to procedure.  We discussed risk benefits alternatives he demonstrates good understanding.     Adine Hoof MD [facility]

## 2023-11-05 NOTE — H&P (View-Only) (Signed)
 Patient ID: Sean Valentine, male   DOB: 02-07-1966, 58 y.o.   MRN: [id number]  Reason for Consult: Follow-up   Referred by Charlynne Coombes, PA-C  Subjective:     HPI:  Sean Valentine is a 58 y.o. male end-stage renal disease on dialysis via tunneled dialysis catheter.  He has a previous two-stage cephalic vein fistula on the right and is left-hand dominant.  He does not have any previous access procedures on the left side.  He dialyzes Mondays Wednesdays and Fridays and also takes Eliquis.  Past Medical History:  Diagnosis Date   Anemia, chronic disease 05/26/2018   Formatting of this note might be different from the original. Eval with Lewis and phlebotomy for Hemochromotosis.   Anticoagulant long-term use 11/26/2016   Arthritis of right foot 05/08/2016   Atypical chest pain 09/13/2020   Bacteremia due to group B Streptococcus 11/03/2020   Benign hypertension with CKD (chronic kidney disease) stage III (HCC) 07/12/2019   Bilateral leg edema    Cellulitis 11/26/2020   Chest pain at rest 08/16/2020   CHF (congestive heart failure) (HCC) 03/05/2012   COVID-19 virus infection 02/09/2020   Deformity of toe 08/01/2020   Diabetic ulcer of toe of right foot associated with type 2 diabetes mellitus, with fat layer exposed (HCC) 01/10/2020   Formatting of this note might be different from the original. Distal tuft right fifth toe   Dyslipidemia 03/05/2012   Essential hypertension 11/26/2016   GERD (gastroesophageal reflux disease) 08/01/2020   Gouty arthritis of left hand 01/15/2017   High risk medication use 11/26/2016   History of COVID-19 05/05/2020   Formatting of this note might be different from the original. 02/2020   History of diabetic ulcer of foot 04/04/2021   History of kidney stones    History of pulmonary embolism 11/26/2016   Formatting of this note might be different from the original. Felt severe with permanent anticoagulation and Filter placed because of burden.    Hypertension    Kidney failure    stage 4   Localized edema 03/05/2012   LVH (left ventricular hypertrophy) 05/26/2018   Formatting of this note might be different from the original. 04/2018 EF 555-60%   Malaise and fatigue 11/26/2016   Medication monitoring encounter 11/15/2020   Metabolic acidosis 01/21/2022   MGUS (monoclonal gammopathy of unknown significance) 06/03/2018   Formatting of this note might be different from the original. Possible. Seen on SPEP 05/2018   Mild persistent asthma 11/26/2016   Mixed hyperlipidemia 11/26/2016   Morbid (severe) obesity due to excess calories (HCC) 03/05/2012   Morbid obesity with body mass index (BMI) of 40.0 to 49.9 (HCC) 11/26/2016   Nail dystrophy 05/20/2018   Nephrolithiasis 03/05/2012   Nonalcoholic steatohepatitis (NASH) 05/26/2018   Obesity, Class III, BMI 40-49.9 (morbid obesity) 11/26/2016   Other hemochromatosis 03/21/2020   PICC (peripherally inserted central catheter) in place 12/12/2020   Posterior tibial tendinitis of right lower extremity 08/01/2020   Pre-ulcerative calluses 06/18/2019   Primary central sleep apnea 11/26/2016   Formatting of this note might be different from the original. bipap   Sepsis (HCC) 10/30/2020   Sepsis due to cellulitis (HCC) 10/30/2020   Sleep apnea 03/05/2012   Formatting of this note might be different from the original. [place].   Spinal stenosis of lumbar region 05/26/2018   Status post amputation of toe of right foot (HCC) 07/26/2015   Subfibular impingement, right 03/13/2016   Tinea pedis 08/01/2020   Type 2 diabetes mellitus  with stage 3b chronic kidney disease, with long-term current use of insulin  (HCC) 07/12/2019   Formatting of this note might be different from the original. IMO 10/01 Updates  Formatting of this note might be different from the original. 2000   Ulcer of right foot with fat layer exposed (HCC) 12/05/2020   Family History  Problem Relation Age of Onset   Cancer Mother     Clotting disorder Mother    Stroke Mother    Alzheimer's disease Mother    Diabetes Mother    Diabetes Father    Past Surgical History:  Procedure Laterality Date   A/V FISTULAGRAM Right 07/22/2022   Procedure: A/V Fistulagram;  Surgeon: Adine Hoof, MD;  Location: [facility] INVASIVE CV LAB;  Service: Cardiovascular;  Laterality: Right;   A/V FISTULAGRAM Right 10/21/2022   Procedure: A/V Fistulagram;  Surgeon: Adine Hoof, MD;  Location: [facility] INVASIVE CV LAB;  Service: Cardiovascular;  Laterality: Right;   A/V FISTULAGRAM Right 02/17/2023   Procedure: A/V Fistulagram;  Surgeon: Dannis Dy, MD;  Location: [facility] INVASIVE CV LAB;  Service: Cardiovascular;  Laterality: Right;   A/V FISTULAGRAM N/A 07/08/2023   Procedure: A/V Fistulagram;  Surgeon: Baron Border, MD;  Location: [facility] INVASIVE CV LAB;  Service: Cardiovascular;  Laterality: N/A;   AV FISTULA PLACEMENT Right 05/17/2022   Procedure: RIGHT BRACHIOCEPHALIC ARTERIOVENOUS (AV) FISTULA CREATION;  Surgeon: Adine Hoof, MD;  Location: [facility] OR;  Service: Vascular;  Laterality: Right;  regional with MAC   BUBBLE STUDY  11/03/2020   Procedure: BUBBLE STUDY;  Surgeon: Harrold Lincoln, MD;  Location: [facility] ENDOSCOPY;  Service: Cardiovascular;;   CATARACT EXTRACTION, BILATERAL Bilateral    CHOLECYSTECTOMY     CYSTOSCOPY W/ URETERAL STENT PLACEMENT Left 05/06/2005   DIALYSIS/PERMA CATHETER INSERTION     DIALYSIS/PERMA CATHETER INSERTION Right 08/29/2023   Procedure: DIALYSIS/PERMA CATHETER INSERTION;  Surgeon: Melodie Spry, MD;  Location: [facility] INVASIVE CV LAB;  Service: Cardiovascular;  Laterality: Right;   EYE SURGERY     FOOT SURGERY Right    Big toe amputation   IVC FILTER INSERTION  08/2016   KIDNEY STONE SURGERY     PERIPHERAL VASCULAR BALLOON ANGIOPLASTY  10/21/2022   Procedure: PERIPHERAL VASCULAR BALLOON ANGIOPLASTY;  Surgeon: Adine Hoof, MD;  Location: [facility] INVASIVE CV LAB;   Service: Cardiovascular;;  R AV fistula   PERIPHERAL VASCULAR BALLOON ANGIOPLASTY Right 02/17/2023   Procedure: PERIPHERAL VASCULAR BALLOON ANGIOPLASTY;  Surgeon: Dannis Dy, MD;  Location: [facility] INVASIVE CV LAB;  Service: Cardiovascular;  Laterality: Right;  rt arrm fistula w/DCB   PERIPHERAL VASCULAR BALLOON ANGIOPLASTY  07/08/2023   Procedure: PERIPHERAL VASCULAR BALLOON ANGIOPLASTY;  Surgeon: Baron Border, MD;  Location: [facility] INVASIVE CV LAB;  Service: Cardiovascular;;  Cephalic Vein   PERIPHERAL VASCULAR THROMBECTOMY Right 08/28/2023   Procedure: PERIPHERAL VASCULAR THROMBECTOMY;  Surgeon: Patrick Boor, MD;  Location: [facility] INVASIVE CV LAB;  Service: Cardiovascular;  Laterality: Right;   REVISON OF ARTERIOVENOUS FISTULA Right 08/02/2022   Procedure: REVISION OF RIGHT ARM FISTULA WITH TRANSPOSITION AND BRANCH LIGATION;  Surgeon: Adine Hoof, MD;  Location: [facility] OR;  Service: Vascular;  Laterality: Right;  Regional Block   SHOULDER SURGERY Left    TEE WITHOUT CARDIOVERSION N/A 11/03/2020   Procedure: TRANSESOPHAGEAL ECHOCARDIOGRAM (TEE);  Surgeon: Harrold Lincoln, MD;  Location: [facility] ENDOSCOPY;  Service: Cardiovascular;  Laterality: N/A;   VENOUS ANGIOPLASTY Right 08/28/2023   Procedure: VENOUS ANGIOPLASTY;  Surgeon: Patrick Boor, MD;  Location: [facility] INVASIVE CV LAB;  Service: Cardiovascular;  Laterality: Right;  70% Body of AVF x2    Short Social History:  Social History   Tobacco Use   Smoking status: Former    Current packs/day: 0.00    Types: Cigarettes    Start date: 1985    Quit date: 1986    Years since quitting: 39.4   Smokeless tobacco: Never  Substance Use Topics   Alcohol use: Not Currently    No Known Allergies  Current Outpatient Medications  Medication Sig Dispense Refill   acetaminophen  (TYLENOL ) 500 MG tablet Take 500 mg by mouth every 6 (six) hours as needed for moderate pain or mild pain.     apixaban (ELIQUIS) 5 MG TABS tablet Take 5 mg by  mouth 2 (two) times daily.     atorvastatin (LIPITOR) 10 MG tablet Take 10 mg by mouth at bedtime.     bisacodyl (DULCOLAX) 5 MG EC tablet Take 5 mg by mouth daily as needed for moderate constipation.     carvedilol  (COREG ) 25 MG tablet Take 1 tablet (25 mg total) by mouth 2 (two) times daily with a meal. (Patient taking differently: Take 50 mg by mouth 2 (two) times daily with a meal. Does not take on Dialysis days Mon., Wed. and Friday in the morning take in evening on) 90 tablet 1   HUMALOG KWIKPEN 200 UNIT/ML KwikPen Inject 150 Units into the skin daily.     insulin  degludec (TRESIBA) 200 UNIT/ML FlexTouch Pen Inject 60 Units into the skin 2 (two) times daily.     Methoxy PEG-Epoetin Beta (MIRCERA IJ) Mircera     nitroGLYCERIN  (NITROSTAT ) 0.4 MG SL tablet Place 0.4 mg under the tongue every 5 (five) minutes as needed for chest pain.     OZEMPIC, 0.25 OR 0.5 MG/DOSE, 2 MG/3ML SOPN Inject 0.25 mg into the skin once a week.     promethazine (PHENERGAN) 25 MG tablet Take 25 mg by mouth every 6 (six) hours as needed for nausea or vomiting.     sucroferric oxyhydroxide (VELPHORO) 500 MG chewable tablet Chew 500 mg by mouth 3 (three) times daily with meals.     torsemide  (DEMADEX ) 100 MG tablet Take 100 mg by mouth daily.     No current facility-administered medications for this visit.    Review of Systems  Constitutional:  Constitutional negative. HENT: HENT negative.  Eyes: Eyes negative.  Respiratory: Respiratory negative.  Cardiovascular: Cardiovascular negative.  GI: Gastrointestinal negative.  Musculoskeletal: Musculoskeletal negative.  Skin: Skin negative.  Hematologic: Hematologic/lymphatic negative.  Psychiatric: Psychiatric negative.        Objective:  Objective   Vitals:   11/05/23 1434  BP: 125/84  Pulse: 93  Temp: 98.2 F (36.8 C)  SpO2: 94%  Weight: (!) 335 lb (152 kg)  Height: 6\' 5"  (1.956 m)   Body mass index is 39.73 kg/m.  Physical Exam HENT:     Head:  Normocephalic.     Nose: Nose normal.  Eyes:     Pupils: Pupils are equal, round, and reactive to light.  Cardiovascular:     Rate and Rhythm: Normal rate.  Pulmonary:     Effort: Pulmonary effort is normal.  Abdominal:     General: Abdomen is flat.  Musculoskeletal:     Comments: Thrombosed right upper extremity fistula  Neurological:     General: No focal deficit present.     Mental Status: He is alert.     Data: +-----------------+-------------+----------+----------+  Right Cephalic   Diameter (cm)Depth (cm) Findings   +-----------------+-------------+----------+----------+  Shoulder                               thrombosed  +-----------------+-------------+----------+----------+  Prox upper arm                          thrombosed  +-----------------+-------------+----------+----------+  Mid upper arm                           thrombosed  +-----------------+-------------+----------+----------+  Dist upper arm                          thrombosed  +-----------------+-------------+----------+----------+  Antecubital fossa                       thrombosed  +-----------------+-------------+----------+----------+   +-----------------+-------------+----------+---------+  Right Basilic    Diameter (cm)Depth (cm)Findings   +-----------------+-------------+----------+---------+  Shoulder            0.61        3.42              +-----------------+-------------+----------+---------+  Prox upper arm       0.55        2.95              +-----------------+-------------+----------+---------+  Mid upper arm        0.53        2.48              +-----------------+-------------+----------+---------+  Dist upper arm       0.66        0.94              +-----------------+-------------+----------+---------+  Antecubital fossa    0.42        0.56   branching  +-----------------+-------------+----------+---------+  Prox forearm          0.30        0.34              +-----------------+-------------+----------+---------+  Mid forearm          0.31        0.70              +-----------------+-------------+----------+---------+  Wrist               0.17        0.24              +-----------------+-------------+----------+---------+   +-----------------+-------------+----------+---------+  Left Cephalic    Diameter (cm)Depth (cm)Findings   +-----------------+-------------+----------+---------+  Shoulder            0.50        2.11              +-----------------+-------------+----------+---------+  Prox upper arm       0.47        1.37              +-----------------+-------------+----------+---------+  Mid upper arm        0.47        0.57   branching  +-----------------+-------------+----------+---------+  Dist upper arm       0.37        0.37              +-----------------+-------------+----------+---------+  Antecubital fossa    0.55  0.62              +-----------------+-------------+----------+---------+  Prox forearm         0.34        0.19              +-----------------+-------------+----------+---------+  Mid forearm          0.29        0.39              +-----------------+-------------+----------+---------+  Wrist               0.21        0.27              +-----------------+-------------+----------+---------+   +-----------------+-------------+----------+--------+  Left Basilic     Diameter (cm)Depth (cm)Findings  +-----------------+-------------+----------+--------+  Shoulder            0.50        2.57             +-----------------+-------------+----------+--------+  Prox upper arm       0.31        2.06             +-----------------+-------------+----------+--------+  Mid upper arm        0.38        1.40             +-----------------+-------------+----------+--------+  Dist upper arm       0.34         0.56             +-----------------+-------------+----------+--------+  Antecubital fossa    0.27        0.46             +-----------------+-------------+----------+--------+  Prox forearm         0.21        0.50             +-----------------+-------------+----------+--------+  Mid forearm          0.15        0.25             +-----------------+-------------+----------+--------+  Wrist               0.11        0.25             +-----------------+-------------+----------+--------+    Right Pre-Dialysis Findings:  +-----------------------+----------+--------------------+-----------+------  --+  Location              PSV (cm/s)Intralum. Diam. (cm)Waveform    Comments  +-----------------------+----------+--------------------+-----------+------  --+  Brachial Antecub. fossa101       0.55                triphasic             +-----------------------+----------+--------------------+-----------+------  --+  Radial Art at Wrist    114       0.24                multiphasic           +-----------------------+----------+--------------------+-----------+------  --+  Ulnar Art at Wrist     112       0.24                multiphasic           +-----------------------+----------+--------------------+-----------+------  --+    Left Pre-Dialysis Findings:  +-----------------------+----------+--------------------+---------+--------  +  Location              PSV (  cm/s)Intralum. Diam. (cm)Waveform  Comments  +-----------------------+----------+--------------------+---------+--------  +  Brachial Antecub. fossa83        0.57                triphasic           +-----------------------+----------+--------------------+---------+--------  +  Radial Art at Wrist    94        0.28                triphasic           +-----------------------+----------+--------------------+---------+--------  +  Ulnar Art at Wrist     112        0.24                triphasic           +-----------------------+----------+--------------------+---------+--------  +    Summary:    Right: No obstruction visualized in the right upper extremity.  Left: No obstruction visualized in the left upper extremity.  *See table(s) above for measurements and observations.       Assessment/Plan:    58 year old male with end-stage renal disease currently dialyzing via catheter.  He has a thrombosed right upper extremity fistula.  We are planning for new access which will either be ligated distally versus graft.  This will be done on a nondialysis day in the near future he will need to hold Eliquis 48 hours prior to procedure.  We discussed risk benefits alternatives he demonstrates good understanding.     Adine Hoof MD [facility]

## 2023-11-06 ENCOUNTER — Other Ambulatory Visit: Payer: Self-pay

## 2023-11-06 DIAGNOSIS — N186 End stage renal disease: Secondary | ICD-10-CM

## 2023-11-07 ENCOUNTER — Other Ambulatory Visit: Payer: Self-pay

## 2023-11-07 ENCOUNTER — Encounter (HOSPITAL_COMMUNITY): Payer: Self-pay | Admitting: Vascular Surgery

## 2023-11-07 NOTE — Progress Notes (Signed)
 SDW call  Patient was given pre-op  instructions over the phone. Patient verbalized understanding of instructions provided. Dialysis M, W, F     PCP - Dr. Authur Leghorn Cardiologist - Dr. Ralene Burger Pulmonary:    PPM/ICD - denies Device Orders - na Rep Notified - na   Chest x-ray - na EKG -  DOS 11/11/2023 Stress Test -09/20/2020 ECHO - 10/02/2021 Cardiac Cath -   Sleep Study/sleep apnea/CPAP: OSA, wears CPAP nightly  Type 2 diabetic.  Currently out of his sensors, so his is using finger sticks to check his blood sugarss Fasting Blood sugar range: 200-250 How often check sugars: Before each meal Tresiba, instructed to take 75 units at night which is 50% of his regular dose Humalog, instructed to take zero units the morning of surgery. He is to use 1/2 the corrections dose for blood sugars over 220   GLP1: Ozempic, last dose 11/05/2023  Blood Thinner Instructions: Eliquis, Surgeon instructed to hold 2 days, patient states he stopped taking it 11/06/2023 by his own choice Aspirin  Instructions:denies   ERAS Protcol - NPO   Anesthesia review: Yes. HTN, CHF, DM, OSA with CPAP, Hx PE, ESRD with dialysis   Patient denies shortness of breath, fever, cough and chest pain over the phone call  Your procedure is scheduled on Tuesday November 11, 2023  Report to [facility] Main Entrance "A" at  0530  A.M., then check in with the Admitting office.  Call this number if you have problems the morning of surgery:  [phone]   If you have any questions prior to your surgery date call [phone]: Open Monday-Friday 8am-4pm If you experience any cold or flu symptoms such as cough, fever, chills, shortness of breath, etc. between now and your scheduled surgery, please notify us  at the above number    Remember:  Do not eat or drink after midnight the night before your surgery  Take these medicines the morning of surgery with A SIP OF WATER:  Carvedilol , tenapanor  As needed: Tylenol ,  phenergan  As of today, STOP taking any Aspirin  (unless otherwise instructed by your surgeon) Aleve, Naproxen, Ibuprofen, Motrin, Advil, Goody's, BC's, all herbal medications, fish oil, and all vitamins.

## 2023-11-10 ENCOUNTER — Encounter (HOSPITAL_COMMUNITY): Payer: Self-pay | Admitting: Vascular Surgery

## 2023-11-10 NOTE — Anesthesia Preprocedure Evaluation (Addendum)
 Anesthesia Evaluation  Patient identified by MRN, date of birth, ID band Patient awake    Reviewed: Allergy & Precautions, NPO status , Patient's Chart, lab work & pertinent test results, reviewed documented beta blocker date and time   Airway Mallampati: II  TM Distance: >3 FB Neck ROM: Full    Dental  (+) Poor Dentition, Dental Advisory Given   Pulmonary asthma , sleep apnea and Continuous Positive Airway Pressure Ventilation , former smoker, PE   Pulmonary exam normal breath sounds clear to auscultation       Cardiovascular hypertension, Pt. on medications and Pt. on home beta blockers pulmonary hypertension+ Peripheral Vascular Disease and +CHF  Normal cardiovascular exam Rhythm:Regular Rate:Normal  TTE 04/08/23 [facility]): Summary   1. Technically difficult study.    2. The left ventricle is normal in size with normal wall thickness.    3. The left ventricular systolic function is overall normal, LVEF is  visually estimated at > 55%.    4. There is decreased contractile function involving the apical segment(s).    5. The right ventricle is normal in size, with normal systolic function.    Nuclear stress test 09/20/20 [facility]): IMPRESSION: 1. No reversible ischemia or infarction. 2. Normal left ventricular wall motion. 3. Left ventricular ejection fraction 62% 4. 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   Abdominal  (+) + obese  Peds  Hematology  (+) Blood  dyscrasia, anemia Eliquis therapy- last dose   Anesthesia Other Findings   Reproductive/Obstetrics                              Anesthesia Physical Anesthesia Plan  ASA: 4  Anesthesia Plan: MAC and Regional   Post-op Pain Management: Regional block* and Minimal or no pain anticipated   Induction: Intravenous  PONV Risk Score and Plan: 2 and Treatment may vary due to age or medical condition and Propofol  infusion  Airway Management Planned: Natural Airway and Simple Face Mask  Additional Equipment: None  Intra-op Plan:   Post-operative Plan:   Informed Consent: I have reviewed the patients History and Physical, chart, labs and discussed the procedure including the risks, benefits and alternatives for the proposed anesthesia with the patient or authorized representative who has indicated his/her understanding and acceptance.     Dental advisory given  Plan Discussed with: CRNA and Anesthesiologist  Anesthesia Plan Comments: (PAT note written 11/10/2023 by Allison Zelenak, PA-C.  )        Anesthesia Quick Evaluation

## 2023-11-10 NOTE — Progress Notes (Addendum)
 Anesthesia Chart Review: SAME DAY WORK-UP  Case: [id number] Date/Time: 11/11/23 0715   Procedures:      ARTERIOVENOUS (AV) FISTULA CREATION (Right)     INSERTION, GRAFT, ARTERIOVENOUS, UPPER EXTREMITY (Right)   Anesthesia type: Choice   Diagnosis: ESRD (end stage renal disease) (HCC) [N18.6]   Pre-op  diagnosis: ESRD   [facility] 12 / [facility]   Surgeons: Adine Hoof, MD       DISCUSSION: Patient is a 58 year old male scheduled for the above procedure. S/p right internal jugular Palindrome TDC on 08/29/23 after unsuccessful attempt at right brachiocephalic AVF thrombectomy.   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   He has been followed by cardiologist Dr. Krasowski for diastolic CHF and HTN.  Last cardiology was on 03/13/23 with Pattricia Bores, NP. On dialysis ~ 2023. Had pending [facility] transplant team evaluation at that time. Denied chest pain, palpitations, dyspnea, syncope, edema. One year follow-up planned.   Cardiac testing has included a non-ischemic nuclear stress test on 09/20/20, EF 62%. 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 Most recent TTE was on 04/08/23 as part of renal transplant evaluation showed LVEF normal, visually estimated at > 55%, decreased contractile function involving the apical segments, normal RV size and function, no significant valvular abnormalities. No additional pre-transplant cardiac testing done as he was not  felt to be a renal transplant candidate as of 06/19/23 [facility] Renal Transplant Team: "Mr. Rho is a very pleasant 58 year old male seeking evaluation for kidney transplant. He unfortunately is very frail at today's visit. He requires a cane to ambulate, assistance to get up from a chair and endorses significant fatigue. I had a thorough conversation with the patient and his wife that kidney transplant would not be a safe decision for him at this time due to his fraility. If his fraility and functional status improve, he is welcome to be re-referred and we would be happy to evaluate him again. "  A1c 10.4% on 10/01/23. He is on Ozempic, Tresiba, Humalog. He reported fasting CBGs ~ 200-250. Last Ozempic was on 11/05/23 (day of [facility] evaluation and surgery posting). Endocrinologist is Dr. Vick Gram.  Per [facility], to hold Eliquis for 2 days. He stopped it on 11/06/23.   Discussed with anesthesiologist Arvie Latus, MD. Anesthesia team to evaluate on the day of surgery. Will get labs including glucose on arrival.    VS: Ht 6\' 5"  (1.956 m)   Wt (!) 151 kg   BMI 39.49 kg/m  BP Readings from Last 3 Encounters:  11/05/23 125/84  08/29/23 (!) 178/90  08/28/23 (!) 143/80   Pulse Readings from Last 3 Encounters:  11/05/23 93  08/29/23 78  08/28/23 79     PROVIDERS: Abbe Hoard., MD is PCP  Ralene Burger, MD is cardiologist Arman Lamprey, MD is HEM. Ferritin > 100 with otherwise unremarkable iron studies as of 06/20/23 follow-up. No phlebotomy recommended that day. One year follow-up planned.  Laurence Pons, MD is endocrinologist   LABS: For day of  procedure. At [facility] (see CE): H/H 11.6/34.8 on 11/05/23, PLT 234 08/15/23, A1c 10.4% on 10/01/23.    IMAGES: PCXR 04/26/23 [facility] [place]/PACS): FINDINGS: The cardiomediastinal silhouette is normal. The lungs are clear. There is no pleural effusion or pneumothorax. No acute osseous abnormality is present. IMPRESSION: No evidence of  acute cardiopulmonary process.   EKG: EKG 04/08/23 [facility]): Per Narrative in Care Everywhere: NORMAL SINUS RHYTHM  LEFT AXIS DEVIATION  INTRAVENTRICULAR CONDUCTION DELAY  MINIMAL VOLTAGE CRITERIA FOR LVH, MAY BE NORMAL VARIANT ( Cornell product )  ABNORMAL ECG  WHEN COMPARED WITH ECG OF 19-Jan-2022 06:26,  FUSION COMPLEXES ARE NO LONGER PRESENT  Confirmed by Karlis Overland ([id number]) on 04/09/2023 12:45:41 PM    CV: TTE 04/08/23 [facility] CE; pre-transplant evaluation): Summary   1. Technically difficult study.    2. The left ventricle is normal in size with normal wall thickness.    3. The left ventricular systolic function is overall normal, LVEF is  visually estimated at > 55%.    4. There is decreased contractile function involving the apical segment(s).    5. The right ventricle is normal in size, with normal systolic function.  - Comparison TTE: LVEF 60-65%, no RWMA, normal LV cavity size, moderate LVH, grade 1 DD, normal RVSF   Long Term Monitor 04/22/22 - 05/01/22: Patient had a min HR of 63 bpm, max HR of 113 bpm, and avg HR of 84 bpm. Predominant underlying rhythm was Sinus Rhythm. First Degree AV Block was present. Isolated SVEs were rare (<1.0%), and no SVE Couplets or SVE Triplets were present. Isolated VEs  were rare (<1.0%), and no VE Couplets or VE Triplets were present. Summary and conclusions: First-degree AV block noted, infrequent supraventricular ectopy and infrequent ventricular ectopy.   Nuclear stress test 09/20/20 [facility]): IMPRESSION: 1. No reversible ischemia or infarction. 2. Normal left ventricular wall motion. 3. Left ventricular ejection fraction 62% 4. Non invasive risk stratification*: Low *2012 Appropriate Use Criteria for Coronary Revascularization Focused Update: J Am Coll Cardiol. 2012;59(9):857-881. http://content.dementiazones.com.aspx?articleid=1201161   US  Carotid 04/05/20 [facility] [place]/PACS): IMPRESSION: Color duplex  indicates minimal homogeneous plaque, with no hemodynamically significant stenosis by duplex criteria in the extracranial cerebrovascular circulation.   Past Medical History:  Diagnosis Date   Anemia, chronic disease 05/26/2018   Formatting of this note might be different from the original. Eval with Lewis and phlebotomy for Hemochromotosis.   Anticoagulant long-term use 11/26/2016   Arthritis of right foot 05/08/2016   Atypical chest pain 09/13/2020   Bacteremia due to group B Streptococcus 11/03/2020   Benign hypertension with CKD (chronic kidney disease) stage III (HCC) 07/12/2019   Bilateral leg edema    Cellulitis 11/26/2020   Chest pain at rest 08/16/2020   CHF (congestive heart failure) (HCC) 03/05/2012   COVID-19 virus infection 02/09/2020   Deformity of toe 08/01/2020   Diabetic ulcer of toe of right foot associated with type 2 diabetes mellitus, with fat layer exposed (HCC) 01/10/2020   Formatting of this note might be different from the original. Distal tuft right fifth toe   Dyslipidemia 03/05/2012   Essential hypertension 11/26/2016   GERD (gastroesophageal reflux disease) 08/01/2020   Gouty arthritis of left hand 01/15/2017   High risk medication use 11/26/2016   History of COVID-19 05/05/2020   Formatting of this note might be different from the original. 02/2020   History of diabetic ulcer of foot 04/04/2021   History of kidney stones    History of pulmonary embolism 11/26/2016  Formatting of this note might be different from the original. Felt severe with permanent anticoagulation and Filter placed because of burden.   Hypertension    Kidney failure    stage 4   Localized edema 03/05/2012   LVH (left ventricular hypertrophy) 05/26/2018   Formatting of this note might be different from the original. 04/2018 EF 555-60%   Malaise and fatigue 11/26/2016   Medication monitoring encounter 11/15/2020   Metabolic acidosis 01/21/2022   MGUS (monoclonal gammopathy of  unknown significance) 06/03/2018   Formatting of this note might be different from the original. Possible. Seen on SPEP 05/2018   Mild persistent asthma 11/26/2016   Mixed hyperlipidemia 11/26/2016   Morbid (severe) obesity due to excess calories (HCC) 03/05/2012   Morbid obesity with body mass index (BMI) of 40.0 to 49.9 (HCC) 11/26/2016   Nail dystrophy 05/20/2018   Nephrolithiasis 03/05/2012   Nonalcoholic steatohepatitis (NASH) 05/26/2018   Obesity, Class III, BMI 40-49.9 (morbid obesity) 11/26/2016   Other hemochromatosis 03/21/2020   PICC (peripherally inserted central catheter) in place 12/12/2020   Posterior tibial tendinitis of right lower extremity 08/01/2020   Pre-ulcerative calluses 06/18/2019   Primary central sleep apnea 11/26/2016   Formatting of this note might be different from the original. bipap   Sepsis (HCC) 10/30/2020   Sepsis due to cellulitis (HCC) 10/30/2020   Sleep apnea 03/05/2012   Formatting of this note might be different from the original. [place].   Spinal stenosis of lumbar region 05/26/2018   Status post amputation of toe of right foot (HCC) 07/26/2015   Subfibular impingement, right 03/13/2016   Tinea pedis 08/01/2020   Type 2 diabetes mellitus with stage 3b chronic kidney disease, with long-term current use of insulin  (HCC) 07/12/2019   Formatting of this note might be different from the original. IMO 10/01 Updates  Formatting of this note might be different from the original. 2000   Ulcer of right foot with fat layer exposed (HCC) 12/05/2020    Past Surgical History:  Procedure Laterality Date   A/V FISTULAGRAM Right 07/22/2022   Procedure: A/V Fistulagram;  Surgeon: Adine Hoof, MD;  Location: [facility] INVASIVE CV LAB;  Service: Cardiovascular;  Laterality: Right;   A/V FISTULAGRAM Right 10/21/2022   Procedure: A/V Fistulagram;  Surgeon: Adine Hoof, MD;  Location: [facility] INVASIVE CV LAB;  Service: Cardiovascular;  Laterality:  Right;   A/V FISTULAGRAM Right 02/17/2023   Procedure: A/V Fistulagram;  Surgeon: Dannis Dy, MD;  Location: [facility] INVASIVE CV LAB;  Service: Cardiovascular;  Laterality: Right;   A/V FISTULAGRAM N/A 07/08/2023   Procedure: A/V Fistulagram;  Surgeon: Baron Border, MD;  [facility]: [facility] INVASIVE CV LAB;  Service: Cardiovascular;  Laterality: N/A;   AV FISTULA PLACEMENT Right 05/17/2022   Procedure: RIGHT BRACHIOCEPHALIC ARTERIOVENOUS (AV) FISTULA CREATION;  Surgeon: Adine Hoof, MD;  Location: [facility] OR;  Service: Vascular;  Laterality: Right;  regional with MAC   BUBBLE STUDY  11/03/2020   Procedure: BUBBLE STUDY;  Surgeon: Harrold Lincoln, MD;  Location: [facility] ENDOSCOPY;  Service: Cardiovascular;;   CATARACT EXTRACTION, BILATERAL Bilateral    CHOLECYSTECTOMY     CYSTOSCOPY W/ URETERAL STENT PLACEMENT Left 05/06/2005   DIALYSIS/PERMA CATHETER INSERTION     DIALYSIS/PERMA CATHETER INSERTION Right 08/29/2023   Procedure: DIALYSIS/PERMA CATHETER INSERTION;  Surgeon: Melodie Spry, MD;  Location: [facility] INVASIVE CV LAB;  Service: Cardiovascular;  Laterality: Right;   EYE SURGERY     FOOT SURGERY Right    Big toe  amputation   IVC FILTER INSERTION  08/2016   KIDNEY STONE SURGERY     PERIPHERAL VASCULAR BALLOON ANGIOPLASTY  10/21/2022   Procedure: PERIPHERAL VASCULAR BALLOON ANGIOPLASTY;  Surgeon: Adine Hoof, MD;  Location: [facility] INVASIVE CV LAB;  Service: Cardiovascular;;  R AV fistula   PERIPHERAL VASCULAR BALLOON ANGIOPLASTY Right 02/17/2023   Procedure: PERIPHERAL VASCULAR BALLOON ANGIOPLASTY;  Surgeon: Dannis Dy, MD;  Location: [facility] INVASIVE CV LAB;  Service: Cardiovascular;  Laterality: Right;  rt arrm fistula w/DCB   PERIPHERAL VASCULAR BALLOON ANGIOPLASTY  07/08/2023   Procedure: PERIPHERAL VASCULAR BALLOON ANGIOPLASTY;  Surgeon: Baron Border, MD;  [facility] INVASIVE CV LAB;  Service: Cardiovascular;;  Cephalic Vein   PERIPHERAL VASCULAR  THROMBECTOMY Right 08/28/2023   Procedure: PERIPHERAL VASCULAR THROMBECTOMY;  Surgeon: Patrick Boor, MD;  Location: [facility] INVASIVE CV LAB;  Service: Cardiovascular;  Laterality: Right;   REVISON OF ARTERIOVENOUS FISTULA Right 08/02/2022   Procedure: REVISION OF RIGHT ARM FISTULA WITH TRANSPOSITION AND BRANCH LIGATION;  Surgeon: Adine Hoof, MD;  Location: [facility] OR;  Service: Vascular;  Laterality: Right;  Regional Block   SHOULDER SURGERY Left    TEE WITHOUT CARDIOVERSION N/A 11/03/2020   Procedure: TRANSESOPHAGEAL ECHOCARDIOGRAM (TEE);  Surgeon: Harrold Lincoln, MD;  Location: [facility] ENDOSCOPY;  Service: Cardiovascular;  Laterality: N/A;   VENOUS ANGIOPLASTY Right 08/28/2023   Procedure: VENOUS ANGIOPLASTY;  Surgeon: Patrick Boor, MD;  Location: [facility] INVASIVE CV LAB;  Service: Cardiovascular;  Laterality: Right;  70% Body of AVF x2    MEDICATIONS: No current facility-administered medications for this encounter.    acetaminophen  (TYLENOL ) 500 MG tablet   apixaban (ELIQUIS) 5 MG TABS tablet   atorvastatin (LIPITOR) 10 MG tablet   bisacodyl (DULCOLAX) 5 MG EC tablet   carvedilol  (COREG ) 25 MG tablet   HUMALOG KWIKPEN 200 UNIT/ML KwikPen   insulin  degludec (TRESIBA) 200 UNIT/ML FlexTouch Pen   nitroGLYCERIN  (NITROSTAT ) 0.4 MG SL tablet   OZEMPIC, 0.25 OR 0.5 MG/DOSE, 2 MG/3ML SOPN   promethazine (PHENERGAN) 25 MG tablet   sucroferric oxyhydroxide (VELPHORO) 500 MG chewable tablet   Tenapanor HCl, CKD, (XPHOZAH) 30 MG TABS   torsemide  (DEMADEX ) 100 MG tablet   Methoxy PEG-Epoetin Beta (MIRCERA IJ)    Ella Gun, PA-C Surgical Short Stay/Anesthesiology [facility] Phone [phone] [facility] Phone [phone] 11/10/2023 1:14 PM

## 2023-11-11 ENCOUNTER — Other Ambulatory Visit: Payer: Self-pay

## 2023-11-11 ENCOUNTER — Encounter: Payer: Self-pay | Admitting: Oncology

## 2023-11-11 ENCOUNTER — Ambulatory Visit (HOSPITAL_COMMUNITY): Payer: Self-pay | Admitting: Vascular Surgery

## 2023-11-11 ENCOUNTER — Encounter (HOSPITAL_COMMUNITY): Payer: Self-pay | Admitting: Vascular Surgery

## 2023-11-11 ENCOUNTER — Encounter (HOSPITAL_COMMUNITY)
Admission: RE | Disposition: A | Discharge status: Home / Self Care | Payer: Self-pay | Source: Ambulatory Visit | Attending: Vascular Surgery

## 2023-11-11 ENCOUNTER — Other Ambulatory Visit (HOSPITAL_COMMUNITY): Payer: Self-pay

## 2023-11-11 ENCOUNTER — Ambulatory Visit (HOSPITAL_COMMUNITY)
Admission: RE | Admit: 2023-11-11 | Discharge: 2023-11-11 | Disposition: A | Discharge status: Home / Self Care | Source: Ambulatory Visit | Attending: Vascular Surgery | Admitting: Vascular Surgery

## 2023-11-11 DIAGNOSIS — Z833 Family history of diabetes mellitus: Secondary | ICD-10-CM | POA: Insufficient documentation

## 2023-11-11 DIAGNOSIS — G4730 Sleep apnea, unspecified: Secondary | ICD-10-CM | POA: Insufficient documentation

## 2023-11-11 DIAGNOSIS — N186 End stage renal disease: Secondary | ICD-10-CM

## 2023-11-11 DIAGNOSIS — Z7985 Long-term (current) use of injectable non-insulin antidiabetic drugs: Secondary | ICD-10-CM | POA: Insufficient documentation

## 2023-11-11 DIAGNOSIS — I493 Ventricular premature depolarization: Secondary | ICD-10-CM | POA: Insufficient documentation

## 2023-11-11 DIAGNOSIS — E1122 Type 2 diabetes mellitus with diabetic chronic kidney disease: Secondary | ICD-10-CM | POA: Diagnosis not present

## 2023-11-11 DIAGNOSIS — Z794 Long term (current) use of insulin: Secondary | ICD-10-CM | POA: Diagnosis not present

## 2023-11-11 DIAGNOSIS — I132 Hypertensive heart and chronic kidney disease with heart failure and with stage 5 chronic kidney disease, or end stage renal disease: Secondary | ICD-10-CM | POA: Insufficient documentation

## 2023-11-11 DIAGNOSIS — Z7901 Long term (current) use of anticoagulants: Secondary | ICD-10-CM | POA: Insufficient documentation

## 2023-11-11 DIAGNOSIS — I444 Left anterior fascicular block: Secondary | ICD-10-CM | POA: Insufficient documentation

## 2023-11-11 DIAGNOSIS — Z6839 Body mass index (BMI) 39.0-39.9, adult: Secondary | ICD-10-CM | POA: Insufficient documentation

## 2023-11-11 DIAGNOSIS — Z87891 Personal history of nicotine dependence: Secondary | ICD-10-CM | POA: Diagnosis not present

## 2023-11-11 DIAGNOSIS — D631 Anemia in chronic kidney disease: Secondary | ICD-10-CM | POA: Diagnosis not present

## 2023-11-11 DIAGNOSIS — I440 Atrioventricular block, first degree: Secondary | ICD-10-CM | POA: Insufficient documentation

## 2023-11-11 DIAGNOSIS — I509 Heart failure, unspecified: Secondary | ICD-10-CM | POA: Diagnosis not present

## 2023-11-11 DIAGNOSIS — E782 Mixed hyperlipidemia: Secondary | ICD-10-CM | POA: Diagnosis not present

## 2023-11-11 DIAGNOSIS — E669 Obesity, unspecified: Secondary | ICD-10-CM | POA: Insufficient documentation

## 2023-11-11 DIAGNOSIS — T82868A Thrombosis of vascular prosthetic devices, implants and grafts, initial encounter: Secondary | ICD-10-CM | POA: Insufficient documentation

## 2023-11-11 DIAGNOSIS — Z992 Dependence on renal dialysis: Secondary | ICD-10-CM

## 2023-11-11 DIAGNOSIS — Z79899 Other long term (current) drug therapy: Secondary | ICD-10-CM | POA: Diagnosis not present

## 2023-11-11 DIAGNOSIS — Z86711 Personal history of pulmonary embolism: Secondary | ICD-10-CM | POA: Diagnosis not present

## 2023-11-11 DIAGNOSIS — Z95828 Presence of other vascular implants and grafts: Secondary | ICD-10-CM

## 2023-11-11 HISTORY — PX: AV FISTULA PLACEMENT: SHX1204

## 2023-11-11 HISTORY — DX: Hemochromatosis, unspecified: E83.119

## 2023-11-11 LAB — POCT I-STAT, CHEM 8
BUN: 46 mg/dL — ABNORMAL HIGH (ref 6–20)
Calcium, Ion: 1.18 mmol/L (ref 1.15–1.40)
Chloride: 95 mmol/L — ABNORMAL LOW (ref 98–111)
Creatinine, Ser: 4.9 mg/dL — ABNORMAL HIGH (ref 0.61–1.24)
Glucose, Bld: 239 mg/dL — ABNORMAL HIGH (ref 70–99)
HCT: 42 % (ref 39.0–52.0)
Hemoglobin: 14.3 g/dL (ref 13.0–17.0)
Potassium: 3.8 mmol/L (ref 3.5–5.1)
Sodium: 133 mmol/L — ABNORMAL LOW (ref 135–145)
TCO2: 24 mmol/L (ref 22–32)

## 2023-11-11 LAB — GLUCOSE, CAPILLARY
Glucose-Capillary: 248 mg/dL — ABNORMAL HIGH (ref 70–99)
Glucose-Capillary: 160 mg/dL — ABNORMAL HIGH (ref 70–99)

## 2023-11-11 SURGERY — ARTERIOVENOUS (AV) FISTULA CREATION
Anesthesia: Monitor Anesthesia Care | Site: Arm Upper | Laterality: Right

## 2023-11-11 MED ORDER — CEFAZOLIN SODIUM-DEXTROSE 3-4 GM/150ML-% IV SOLN
3.0000 g | INTRAVENOUS | Status: AC
  Administered 2023-11-11: 3 g via INTRAVENOUS

## 2023-11-11 MED ORDER — ORAL CARE MOUTH RINSE: 15.0000 mL | Freq: Once | OROMUCOSAL | Status: AC

## 2023-11-11 MED ORDER — ONDANSETRON HCL 4 MG/2ML IJ SOLN
INTRAMUSCULAR | Status: DC | PRN
  Administered 2023-11-11: 4 mg via INTRAVENOUS

## 2023-11-11 MED ORDER — PROPOFOL 10 MG/ML IV BOLUS
INTRAVENOUS | Status: AC
  Filled 2023-11-11: qty 20

## 2023-11-11 MED ORDER — EPHEDRINE 5 MG/ML INJ
INTRAVENOUS | Status: AC
  Filled 2023-11-11: qty 5

## 2023-11-11 MED ORDER — CEFAZOLIN SODIUM-DEXTROSE 3-4 GM/150ML-% IV SOLN
INTRAVENOUS | Status: AC
  Filled 2023-11-11: qty 150

## 2023-11-11 MED ORDER — CHLORHEXIDINE GLUCONATE 4 % EX SOLN: 60.0000 mL | Freq: Once | CUTANEOUS | Status: DC

## 2023-11-11 MED ORDER — PROPOFOL 500 MG/50ML IV EMUL
INTRAVENOUS | Status: DC | PRN
  Administered 2023-11-11: 150 ug/kg/min via INTRAVENOUS
  Administered 2023-11-11: 20 mg via INTRAVENOUS
  Administered 2023-11-11: 100 ug/kg/min via INTRAVENOUS
  Administered 2023-11-11: 20 mg via INTRAVENOUS

## 2023-11-11 MED ORDER — MIDAZOLAM HCL 2 MG/2ML IJ SOLN
INTRAMUSCULAR | Status: DC | PRN
  Administered 2023-11-11: 1 mg via INTRAVENOUS

## 2023-11-11 MED ORDER — CHLORHEXIDINE GLUCONATE 0.12 % MT SOLN: 15.0000 mL | Freq: Once | OROMUCOSAL | Status: AC

## 2023-11-11 MED ORDER — LACTATED RINGERS IV SOLN: INTRAVENOUS | Status: DC

## 2023-11-11 MED ORDER — ROPIVACAINE HCL 5 MG/ML IJ SOLN
INTRAMUSCULAR | Status: DC | PRN
  Administered 2023-11-11: 30 mL via PERINEURAL

## 2023-11-11 MED ORDER — FENTANYL CITRATE (PF) 250 MCG/5ML IJ SOLN
INTRAMUSCULAR | Status: AC
Start: 2023-11-11 — End: ?
  Filled 2023-11-11: qty 5

## 2023-11-11 MED ORDER — PHENYLEPHRINE 80 MCG/ML (10ML) SYRINGE FOR IV PUSH (FOR BLOOD PRESSURE SUPPORT)
PREFILLED_SYRINGE | INTRAVENOUS | Status: AC
  Filled 2023-11-11: qty 10

## 2023-11-11 MED ORDER — FENTANYL CITRATE (PF) 100 MCG/2ML IJ SOLN: INTRAMUSCULAR | Status: DC | PRN

## 2023-11-11 MED ORDER — FENTANYL CITRATE (PF) 250 MCG/5ML IJ SOLN
INTRAMUSCULAR | Status: DC | PRN
  Administered 2023-11-11: 50 ug via INTRAVENOUS

## 2023-11-11 MED ORDER — DEXMEDETOMIDINE HCL IN NACL 80 MCG/20ML IV SOLN
INTRAVENOUS | Status: DC | PRN
  Administered 2023-11-11 (×3): 8 ug via INTRAVENOUS

## 2023-11-11 MED ORDER — 0.9 % SODIUM CHLORIDE (POUR BTL) OPTIME
TOPICAL | Status: DC | PRN
  Administered 2023-11-11: 1000 mL

## 2023-11-11 MED ORDER — ONDANSETRON HCL 4 MG/2ML IJ SOLN
INTRAMUSCULAR | Status: AC
  Filled 2023-11-11: qty 2

## 2023-11-11 MED ORDER — MIDAZOLAM HCL 2 MG/2ML IJ SOLN
INTRAMUSCULAR | Status: AC
  Filled 2023-11-11: qty 2

## 2023-11-11 MED ORDER — OXYCODONE-ACETAMINOPHEN 5-325 MG PO TABS
1.0000 | ORAL_TABLET | Freq: Four times a day (QID) | ORAL | 0 refills | Status: DC | PRN
  Filled 2023-11-11: qty 8, 2d supply, fill #0

## 2023-11-11 MED ORDER — HEPARIN 6000 UNIT IRRIGATION SOLUTION
Status: AC
  Filled 2023-11-11: qty 500

## 2023-11-11 MED ORDER — SODIUM CHLORIDE 0.9 % IV SOLN: INTRAVENOUS | Status: DC | PRN

## 2023-11-11 MED ORDER — SUCCINYLCHOLINE CHLORIDE 200 MG/10ML IV SOSY
PREFILLED_SYRINGE | INTRAVENOUS | Status: AC
  Filled 2023-11-11: qty 10

## 2023-11-11 MED ORDER — HEPARIN 6000 UNIT IRRIGATION SOLUTION
Status: DC | PRN
  Administered 2023-11-11: 1

## 2023-11-11 MED ORDER — LIDOCAINE 2% (20 MG/ML) 5 ML SYRINGE
INTRAMUSCULAR | Status: AC
  Filled 2023-11-11: qty 5

## 2023-11-11 MED ORDER — DEXMEDETOMIDINE HCL IN NACL 80 MCG/20ML IV SOLN
INTRAVENOUS | Status: AC
  Filled 2023-11-11: qty 20

## 2023-11-11 MED ORDER — CHLORHEXIDINE GLUCONATE 0.12 % MT SOLN
OROMUCOSAL | Status: AC
  Administered 2023-11-11: 15 mL via OROMUCOSAL
  Filled 2023-11-11: qty 15

## 2023-11-11 MED ORDER — SODIUM CHLORIDE 0.9% FLUSH: 3.0000 mL | INTRAVENOUS | Status: DC | PRN

## 2023-11-11 SURGICAL SUPPLY — 26 items
ARMBAND PINK RESTRICT EXTREMIT (MISCELLANEOUS) ×2 IMPLANT
BAG COUNTER SPONGE SURGICOUNT (BAG) ×2 IMPLANT
CANISTER SUCTION 3000ML PPV (SUCTIONS) ×2 IMPLANT
CLIP LIGATING EXTRA MED SLVR (CLIP) ×2 IMPLANT
CLIP LIGATING EXTRA SM BLUE (MISCELLANEOUS) ×2 IMPLANT
COVER PROBE W GEL 5X96 (DRAPES) IMPLANT
DERMABOND ADVANCED .7 DNX12 (GAUZE/BANDAGES/DRESSINGS) ×2 IMPLANT
DERMABOND ADVANCED .7 DNX6 (GAUZE/BANDAGES/DRESSINGS) ×1 IMPLANT
ELECTRODE REM PT RTRN 9FT ADLT (ELECTROSURGICAL) ×2 IMPLANT
GLOVE BIO SURGEON STRL SZ7.5 (GLOVE) ×2 IMPLANT
GOWN STRL REUS W/ TWL LRG LVL3 (GOWN DISPOSABLE) ×4 IMPLANT
GOWN STRL REUS W/ TWL XL LVL3 (GOWN DISPOSABLE) ×2 IMPLANT
INSERT FOGARTY SM (MISCELLANEOUS) IMPLANT
KIT BASIN OR (CUSTOM PROCEDURE TRAY) ×2 IMPLANT
KIT TURNOVER KIT B (KITS) ×2 IMPLANT
NS IRRIG 1000ML POUR BTL (IV SOLUTION) ×2 IMPLANT
PACK CV ACCESS (CUSTOM PROCEDURE TRAY) ×2 IMPLANT
PAD ARMBOARD POSITIONER FOAM (MISCELLANEOUS) ×4 IMPLANT
POWDER SURGICEL 3.0 GRAM (HEMOSTASIS) IMPLANT
SLING ARM FOAM STRAP LRG (SOFTGOODS) IMPLANT
SUT MNCRL AB 4-0 PS2 18 (SUTURE) ×2 IMPLANT
SUT PROLENE 6 0 BV (SUTURE) ×2 IMPLANT
SUT VIC AB 3-0 SH 27X BRD (SUTURE) ×2 IMPLANT
TOWEL GREEN STERILE (TOWEL DISPOSABLE) ×2 IMPLANT
UNDERPAD 30X36 HEAVY ABSORB (UNDERPADS AND DIAPERS) ×2 IMPLANT
WATER STERILE IRR 1000ML POUR (IV SOLUTION) ×2 IMPLANT

## 2023-11-11 NOTE — Progress Notes (Deleted)
 Spoke with anesthesiology regarding patient blood sugar.  Patient had 50 units of Humalog at 4am.  Upon arrival at 6 am, patient blood sugar was 248. Awaiting call back with direction.

## 2023-11-11 NOTE — Anesthesia Postprocedure Evaluation (Signed)
 Anesthesia Post Note  Patient: Sean Valentine  Procedure(s) Performed: ARTERIOVENOUS (AV) FISTULA CREATION (Right: Arm Upper)     Patient location during evaluation: PACU Anesthesia Type: Regional and MAC Level of consciousness: awake and alert and oriented Pain management: pain level controlled Vital Signs Assessment: post-procedure vital signs reviewed and stable Respiratory status: spontaneous breathing, nonlabored ventilation and respiratory function stable Cardiovascular status: stable and blood pressure returned to baseline Postop Assessment: no apparent nausea or vomiting Anesthetic complications: no   No notable events documented.  Last Vitals:  Vitals:   11/11/23 0915 11/11/23 0930  BP: 138/73 125/75  Pulse: 89 87  Resp: 19 16  Temp:    SpO2: 92% 92%    Last Pain:  Vitals:   11/11/23 0930  TempSrc:   PainSc: 0-No pain                 Coriana Angello A.

## 2023-11-11 NOTE — Interval H&P Note (Signed)
 History and Physical Interval Note:  11/11/2023 7:10 AM  Sean Valentine  has presented today for surgery, with the diagnosis of ESRD.  The various methods of treatment have been discussed with the patient and family. After consideration of risks, benefits and other options for treatment, the patient has consented to  Procedure(s): ARTERIOVENOUS (AV) FISTULA CREATION (Right) INSERTION, GRAFT, ARTERIOVENOUS, UPPER EXTREMITY (Right) as a surgical intervention.  The patient's history has been reviewed, patient examined, no change in status, stable for surgery.  I have reviewed the patient's chart and labs.  Questions were answered to the patient's satisfaction.     Angela Kell

## 2023-11-11 NOTE — Progress Notes (Signed)
 When patient woke this morning his blood sugar was greater than 350 and he took 50 units of humalog.  Upon arrival to hospital, his blood sugar was a 248. Pat rechecked his blood sugar at 6:30 and it was trending down to 231. Per anesthesiology, M. Foster, we will not give insulin  at this time. Patient states he is okay with not taking any.

## 2023-11-11 NOTE — Transfer of Care (Signed)
 Immediate Anesthesia Transfer of Care Note  Patient: Sean Valentine  Procedure(s) Performed: ARTERIOVENOUS (AV) FISTULA CREATION (Right: Arm Upper)  Patient Location: PACU  Anesthesia Type:MAC  Level of Consciousness: awake, alert , and oriented  Airway & Oxygen Therapy: Patient Spontanous Breathing and Patient connected to face mask oxygen  Post-op Assessment: Report given to RN and Post -op Vital signs reviewed and stable  Post vital signs: Reviewed and stable  Last Vitals:  Vitals Value Taken Time  BP 155/90 11/11/23 0907  Temp 36.7 C 11/11/23 0906  Pulse 89 11/11/23 0910  Resp 14 11/11/23 0910  SpO2 92 % 11/11/23 0910  Vitals shown include unfiled device data.  Last Pain:  Vitals:   11/11/23 0646  TempSrc:   PainSc: 0-No pain         Complications: No notable events documented.

## 2023-11-11 NOTE — Anesthesia Procedure Notes (Signed)
 Anesthesia Regional Block: Supraclavicular block   Pre-Anesthetic Checklist: , timeout performed,  Correct Patient, Correct Site, Correct Laterality,  Correct Procedure, Correct Position, site marked,  Risks and benefits discussed,  Surgical consent,  Pre-op  evaluation,  At surgeon's request and post-op pain management  Laterality: Right  Prep: chloraprep       Needles:  Injection technique: Single-shot  Needle Type: Echogenic Stimulator Needle     Needle Length: 10cm  Needle Gauge: 21   Needle insertion depth: 8 cm   Additional Needles:   Procedures:,,,, ultrasound used (permanent image in chart),,   Motor weakness within 5 minutes.  Narrative:  Start time: 11/11/2023 7:10 AM End time: 11/11/2023 7:15 AM Injection made incrementally with aspirations every 5 mL.  Performed by: Personally  Anesthesiologist: Tura Gaines, MD  Additional Notes: Timeout performed. Patient sedated. Relevant anatomy ID'd using US . Incremental 2-5ml injection of LA with frequent aspiration. Patient tolerated procedure well.

## 2023-11-11 NOTE — Op Note (Signed)
    Patient name: Sean Valentine MRN: [id number] DOB: [birth year] Sex: male  11/11/2023 Pre-operative Diagnosis: End-stage renal disease Post-operative diagnosis:  Same Surgeon:  Ace Holder C. Vikki Graves, MD Assistant: Maryanna Smart, PA Procedure Performed: Right brachial artery to basilic vein AV fistula creation  Indications: 58 year old male with history of end-stage renal disease currently dialyzing via right IJ tunneled dialysis catheter.  He has a history of a two-stage upper arm cephalic vein fistula on the right which is now thrombosed.  He is indicated for additional right arm access as patient is left-handed.  We have discussed proceeding with fistula versus graft and he demonstrates good understanding.  Given the complexity of the case,  the assistant was necessary in order to expedite the procedure and safely perform the technical aspects of the operation.  The assistant provided traction and countertraction to assist with exposure of the artery and vein.  They also assisted with suture ligation of multiple venous branches.  They played a critical role in the anastomosis. These skills, especially following the Prolene suture for the anastomosis, could not have been adequately performed by a scrub tech assistant.    Findings: Basilic vein measured 4 mm, brachial artery measured 6 mm with disease.  Strong thrill at completion confirmed with Doppler and strong radial and ulnar signals with minimal augmentation with compression of the fistula.   Procedure:  The patient was identified in the holding area and taken to the operating room where he was placed supine operative table and MAC anesthesia was induced.  He was sterilely prepped and draped in the right upper extremity in the usual fashion, antibiotics were administered and a timeout was called.  The previous block of the upper extremity was checked and noted to be intact.  Ultrasound was used to identify a suitable basilic vein as well as brachial  artery.  Transverse incision was created above the antecubital between the 2.  We dissected out the vein dividing branches between ties and marked this for orientation and protected the nerve.  We dissected through the deep fascia the brachial artery encircled this with vessel loop.  The vein was then transected distally and tied off flush with heparinized saline spatulated and clamped.  The artery was clamped distally and proximal opened longitudinally and flushed with heparinized saline in both directions.  The vein was then sewn end to side with 6-0 Prolene suture.  Prior to completion we allowed flushing all directions.  Upon completion there was a very strong thrill in the fistula confirmed with Doppler and strong radial and ulnar signals at the wrist which had minimal augmentation with compression of the fistula.  We then irrigated the wound obtaining stasis and closed in layers of Vicryl and Monocryl.  Dermabond is placed at the skin level.  The patient was awakened from anesthesia having tolerated the procedure without any complication.  All counts were correct at completion.  EBL: 20 cc    Rook Maue C. Vikki Graves, MD [facility] Office: [phone] Pager: [phone]

## 2023-11-11 NOTE — Discharge Instructions (Signed)
   [facility]  Discharge Instructions  AV Fistula or Graft Surgery for Dialysis Access  Please refer to the following instructions for your post-procedure care. Your surgeon or physician assistant will discuss any changes with you.  Activity  You may drive the day following your surgery, if you are comfortable and no longer taking prescription pain medication. Resume full activity as the soreness in your incision resolves.  Bathing/Showering  You may shower after you go home. Keep your incision dry for 48 hours. Do not soak in a bathtub, hot tub, or swim until the incision heals completely. You may not shower if you have a hemodialysis catheter.  Incision Care  Clean your incision with mild soap and water after 48 hours. Pat the area dry with a clean towel. You do not need a bandage unless otherwise instructed. Do not apply any ointments or creams to your incision. You may have skin glue on your incision. Do not peel it off. It will come off on its own in about one week. Your arm may swell a bit after surgery. To reduce swelling use pillows to elevate your arm so it is above your heart. Your doctor will tell you if you need to lightly wrap your arm with an ACE bandage.  Diet  Resume your normal diet. There are not special food restrictions following this procedure. In order to heal from your surgery, it is CRITICAL to get adequate nutrition. Your body requires vitamins, minerals, and protein. Vegetables are the best source of vitamins and minerals. Vegetables also provide the perfect balance of protein. Processed food has little nutritional value, so try to avoid this.  Medications  Resume taking all of your medications. If your incision is causing pain, you may take over-the counter pain relievers such as acetaminophen  (Tylenol ). If you were prescribed a stronger pain medication, please be aware these medications can cause nausea and constipation. Prevent  nausea by taking the medication with a snack or meal. Avoid constipation by drinking plenty of fluids and eating foods with high amount of fiber, such as fruits, vegetables, and grains.  Do not take Tylenol  if you are taking prescription pain medications.  Follow up Your surgeon may want to see you in the office following your access surgery. If so, this will be arranged at the time of your surgery.  Please call us  immediately for any of the following conditions:  Increased pain, redness, drainage (pus) from your incision site Fever of 101 degrees or higher Severe or worsening pain at your incision site Hand pain or numbness.  Reduce your risk of vascular disease:  Stop smoking. If you would like help, call [facility] at 1-800-QUIT-NOW (651-007-4562) or [place] at (813)778-7895  Manage your cholesterol Maintain a desired weight Control your diabetes Keep your blood pressure down  Dialysis  It will take several weeks to several months for your new dialysis access to be ready for use. Your surgeon will determine when it is okay to use it. Your nephrologist will continue to direct your dialysis. You can continue to use your Permcath until your new access is ready for use.   11/11/2023 Sean Valentine [id number] [birth year]  Surgeon(s): Adine Hoof, MD  Procedure(s): Creation right 1st stage basilic vein transposition  x Do not stick fistula for 12 weeks    If you have any questions, please call the office at [phone].

## 2023-11-12 ENCOUNTER — Encounter (HOSPITAL_COMMUNITY): Payer: Self-pay | Admitting: Vascular Surgery

## 2023-12-15 ENCOUNTER — Other Ambulatory Visit: Payer: Self-pay

## 2023-12-15 DIAGNOSIS — N186 End stage renal disease: Secondary | ICD-10-CM

## 2023-12-24 ENCOUNTER — Ambulatory Visit (HOSPITAL_COMMUNITY)
Admission: RE | Admit: 2023-12-24 | Discharge: 2023-12-24 | Disposition: A | Discharge status: Home / Self Care | Source: Ambulatory Visit | Attending: Vascular Surgery | Admitting: Vascular Surgery

## 2023-12-24 ENCOUNTER — Ambulatory Visit (INDEPENDENT_AMBULATORY_CARE_PROVIDER_SITE_OTHER): Admitting: Physician Assistant

## 2023-12-24 ENCOUNTER — Other Ambulatory Visit: Payer: Self-pay

## 2023-12-24 VITALS — BP 127/74 | HR 89 | Temp 97.9°F | Wt 340.4 lb

## 2023-12-24 DIAGNOSIS — N186 End stage renal disease: Secondary | ICD-10-CM

## 2023-12-24 NOTE — Progress Notes (Signed)
 Postoperative Access Visit   History of Present Illness   Sean Valentine is a 58 y.o. year old male who presents for postoperative follow-up for: right brachial artery to basilic vein AV fistula creation on 11/11/23 by Dr. Sheree. The patient's wounds are well healed.  The patient notes no steal symptoms.  The patient is able to complete their activities of daily living.  The patient's current symptoms are:  numbness along posterior forearm. He has a history of a two-stage upper arm cephalic vein fistula on the right which is now thrombosed. He wanted additional right arm access as he is left-handed.   He current dialyzes via a right internal jugular TDC on MWF at the [place] location  Physical Examination   Vitals:   12/24/23 1345  BP: 127/74  Pulse: 89  Temp: 97.9 F (36.6 C)  TempSrc: Temporal  Weight: (!) 340 lb 6.4 oz (154.4 kg)   Body mass index is 40.37 kg/m.  right arm Incision is well healed, 2+ radial pulse, hand grip is 5/5, sensation in digits is intact, palpable thrill, bruit can  be auscultated    VAS US  Duplex Dialysis Access: Findings:  +--------------------+----------+-----------------+--------+  AVF                PSV (cm/s)Flow Vol (mL/min)Comments  +--------------------+----------+-----------------+--------+  Native artery inflow   157          2351                 +--------------------+----------+-----------------+--------+  AVF Anastomosis        664                               +--------------------+----------+-----------------+--------+   +------------+----------+-------------+----------+--------+  OUTFLOW VEINPSV (cm/s)Diameter (cm)Depth (cm)Describe  +------------+----------+-------------+----------+--------+  Prox UA        192        0.81        2.93             +------------+----------+-------------+----------+--------+  Mid UA         233        0.84        2.66              +------------+----------+-------------+----------+--------+  Dist UA        608        0.65        1.28             +------------+----------+-------------+----------+--------+   Summary:  Patent arteriovenous fistula.    Medical Decision Making   Sean Valentine is a 58 y.o. year old male who presents s/p right brachial artery to basilic vein AV fistula creation on 11/11/23 by Dr. Sheree. The patient's wounds are well healed.  The patient notes no steal symptoms. Fistula is patent with great thrill. Fistula has matured well with great volume flow. It however is deep in the right upper arm. I have discussed with him transposition of the fistula to allow for use of the fistula. Risk, benefits, and alternatives to access surgery were discussed.  The patient is aware the risks include but are not limited to: bleeding, infection, steal syndrome, nerve damage, thrombosis, failure to mature, and need for additional procedures.  The patient agrees to proceed with the procedure. We will arrange right 2nd stage basilic vein fistula transposition in the near future on a non dialysis day with Dr. Sheree. He is on Eliquis  which will need to be held prior to surgery.    Teretha Damme, PA-C [facility] [place] Office: [phone]  On call MD: Dr. Magda

## 2023-12-24 NOTE — H&P (View-Only) (Signed)
 Postoperative Access Visit   History of Present Illness   Sean Valentine is a 58 y.o. year old male who presents for postoperative follow-up for: right brachial artery to basilic vein AV fistula creation on 11/11/23 by Dr. Sheree. The patient's wounds are well healed.  The patient notes no steal symptoms.  The patient is able to complete their activities of daily living.  The patient's current symptoms are:  numbness along posterior forearm. He has a history of a two-stage upper arm cephalic vein fistula on the right which is now thrombosed. He wanted additional right arm access as he is left-handed.   He current dialyzes via a right internal jugular TDC on MWF at the [place] location  Physical Examination   Vitals:   12/24/23 1345  BP: 127/74  Pulse: 89  Temp: 97.9 F (36.6 C)  TempSrc: Temporal  Weight: (!) 340 lb 6.4 oz (154.4 kg)   Body mass index is 40.37 kg/m.  right arm Incision is well healed, 2+ radial pulse, hand grip is 5/5, sensation in digits is intact, palpable thrill, bruit can  be auscultated    VAS US  Duplex Dialysis Access: Findings:  +--------------------+----------+-----------------+--------+  AVF                PSV (cm/s)Flow Vol (mL/min)Comments  +--------------------+----------+-----------------+--------+  Native artery inflow   157          2351                 +--------------------+----------+-----------------+--------+  AVF Anastomosis        664                               +--------------------+----------+-----------------+--------+   +------------+----------+-------------+----------+--------+  OUTFLOW VEINPSV (cm/s)Diameter (cm)Depth (cm)Describe  +------------+----------+-------------+----------+--------+  Prox UA        192        0.81        2.93             +------------+----------+-------------+----------+--------+  Mid UA         233        0.84        2.66              +------------+----------+-------------+----------+--------+  Dist UA        608        0.65        1.28             +------------+----------+-------------+----------+--------+   Summary:  Patent arteriovenous fistula.    Medical Decision Making   Sean Valentine is a 58 y.o. year old male who presents s/p right brachial artery to basilic vein AV fistula creation on 11/11/23 by Dr. Sheree. The patient's wounds are well healed.  The patient notes no steal symptoms. Fistula is patent with great thrill. Fistula has matured well with great volume flow. It however is deep in the right upper arm. I have discussed with him transposition of the fistula to allow for use of the fistula. Risk, benefits, and alternatives to access surgery were discussed.  The patient is aware the risks include but are not limited to: bleeding, infection, steal syndrome, nerve damage, thrombosis, failure to mature, and need for additional procedures.  The patient agrees to proceed with the procedure. We will arrange right 2nd stage basilic vein fistula transposition in the near future on a non dialysis day with Dr. Sheree. He is on Eliquis  which will need to be held prior to surgery.    Teretha Damme, PA-C [facility] [place] Office: [phone]  On call MD: Dr. Magda

## 2024-01-08 ENCOUNTER — Encounter (HOSPITAL_COMMUNITY): Payer: Self-pay | Admitting: Vascular Surgery

## 2024-01-08 ENCOUNTER — Other Ambulatory Visit: Payer: Self-pay

## 2024-01-08 NOTE — Progress Notes (Signed)
 PCP - [name] LABOR., MD  Cardiologist - Dr. Bernie, R  PPM/ICD - denies Device Orders - n/a Rep Notified - n/  Chest x-ray - 10-31-20 EKG - 11-11-23 Stress Test - 09-20-20 ECHO - 10-02-21 Cardiac Cath -   CPAP -  uses nightly  Sleep test 5 + years ago  GLP-1 -OZEMPIC last dose 12-31-23  Fasting Blood Sugar - Per patient ranges between 160-170 in am Checks Blood Sugar CGM to left arm  Omnipod insulin  pump- instructed patient to reduce basal rate by 20% Last A1c 10.1 on 04-08-23  Blood Thinner Instructions: apixaban (ELIQUIS) Last dose 01-03-24 Aspirin  Instructions: n/a  ERAS Protcol - NPO  COVID TEST- n/a  Anesthesia review: yes, hx of HTN, ESRD,DM, OSA  Has a port to right chest area for dialysis access  Patient verbally denies any shortness of breath, fever, cough and chest pain during phone call   -------------  SDW INSTRUCTIONS given:  Your procedure is scheduled on January 09, 2024.  Report to [facility] Main Entrance A at 7:20 A.M., and check in at the Admitting office.  Call this number if you have problems the morning of surgery:  [phone]   Remember:  Do not eat or drink after midnight the night before your surgery     Take these medicines the morning of surgery with A SIP OF WATER  acetaminophen  (TYLENOL )  carvedilol  (COREG )  nitroGLYCERIN  (NITROSTAT )  oxyCODONE -acetaminophen  (PERCOCET)  promethazine (PHENERGAN)    As of today, STOP taking any Aspirin  (unless otherwise instructed by your surgeon) Aleve, Naproxen, Ibuprofen, Motrin, Advil, Goody's, BC's, all herbal medications, fish oil, and all vitamins.    WHAT DO I DO ABOUT MY DIABETES MEDICATION?   Do not take oral diabetes medicines (pills) the morning of surgery.       INSULIN  PUMP- Omnipod instructed patient to   reduce basal rate by 20 %  The day of surgery, do not take other diabetes injectables, including Byetta (exenatide), Bydureon (exenatide ER), Victoza (liraglutide), or Trulicity  (dulaglutide).  If your CBG is greater than 220 mg/dL, you may take  of your sliding scale (correction) dose of insulin .   HOW TO MANAGE YOUR DIABETES BEFORE AND AFTER SURGERY  Why is it important to control my blood sugar before and after surgery? Improving blood sugar levels before and after surgery helps healing and can limit problems. A way of improving blood sugar control is eating a healthy diet by:  Eating less sugar and carbohydrates  Increasing activity/exercise  Talking with your doctor about reaching your blood sugar goals High blood sugars (greater than 180 mg/dL) can raise your risk of infections and slow your recovery, so you will need to focus on controlling your diabetes during the weeks before surgery. Make sure that the doctor who takes care of your diabetes knows about your planned surgery including the date and location.  How do I manage my blood sugar before surgery? Check your blood sugar at least 4 times a day, starting 2 days before surgery, to make sure that the level is not too high or low.  Check your blood sugar the morning of your surgery when you wake up and every 2 hours until you get to the Short Stay unit.  If your blood sugar is less than 70 mg/dL, you will need to treat for low blood sugar: Do not take insulin . Treat a low blood sugar (less than 70 mg/dL) with  cup of clear juice (cranberry or apple), 4 glucose  tablets, OR glucose gel. Recheck blood sugar in 15 minutes after treatment (to make sure it is greater than 70 mg/dL). If your blood sugar is not greater than 70 mg/dL on recheck, call [phone] for further instructions. Report your blood sugar to the short stay nurse when you get to Short Stay.  If you are admitted to the hospital after surgery: Your blood sugar will be checked by the staff and you will probably be given insulin  after surgery (instead of oral diabetes medicines) to make sure you have good blood sugar levels. The goal for  blood sugar control after surgery is 80-180 mg/dL.                   Do not wear jewelry, make up, or nail polish            Do not wear lotions, powders, perfumes/colognes, or deodorant.            Do not shave 48 hours prior to surgery.  Men may shave face and neck.            Do not bring valuables to the hospital.            [facility] is not responsible for any belongings or valuables.  Do NOT Smoke (Tobacco/Vaping) 24 hours prior to your procedure If you use a CPAP at night, you may bring all equipment for your overnight stay.   Contacts, glasses, dentures or bridgework may not be worn into surgery.      For patients admitted to the hospital, discharge time will be determined by your treatment team.   Patients discharged the day of surgery will not be allowed to drive home, and someone needs to stay with them for 24 hours.    Special instructions:   [place]- Preparing For Surgery  Before surgery, you can play an important role. Because skin is not sterile, your skin needs to be as free of germs as possible. You can reduce the number of germs on your skin by washing with CHG (chlorahexidine gluconate) Soap before surgery.  CHG is an antiseptic cleaner which kills germs and bonds with the skin to continue killing germs even after washing.    Oral Hygiene is also important to reduce your risk of infection.  Remember - BRUSH YOUR TEETH THE MORNING OF SURGERY WITH YOUR REGULAR TOOTHPASTE  Please do not use if you have an allergy to CHG or antibacterial soaps. If your skin becomes reddened/irritated stop using the CHG.  Do not shave (including legs and underarms) for at least 48 hours prior to first CHG shower. It is OK to shave your face.  Please follow these instructions carefully.   Shower the NIGHT BEFORE SURGERY and the MORNING OF SURGERY with DIAL  Soap.   Pat yourself dry with a CLEAN TOWEL.  Wear CLEAN PAJAMAS to bed the night before surgery  Place CLEAN SHEETS on your  bed the night of your first shower and DO NOT SLEEP WITH PETS.   Day of Surgery: Please shower morning of surgery  Wear Clean/Comfortable clothing the morning of surgery Do not apply any deodorants/lotions.   Remember to brush your teeth WITH YOUR REGULAR TOOTHPASTE.   Questions were answered. Patient verbalized understanding of instructions.

## 2024-01-09 ENCOUNTER — Encounter: Payer: Self-pay | Admitting: Oncology

## 2024-01-09 ENCOUNTER — Encounter (HOSPITAL_COMMUNITY)
Admission: RE | Disposition: A | Discharge status: Home / Self Care | Payer: Self-pay | Source: Home / Self Care | Attending: Vascular Surgery

## 2024-01-09 ENCOUNTER — Encounter (HOSPITAL_COMMUNITY): Payer: Self-pay | Admitting: Vascular Surgery

## 2024-01-09 ENCOUNTER — Other Ambulatory Visit: Payer: Self-pay

## 2024-01-09 ENCOUNTER — Ambulatory Visit (HOSPITAL_COMMUNITY)
Admission: RE | Admit: 2024-01-09 | Discharge: 2024-01-09 | Disposition: A | Discharge status: Home / Self Care | Attending: Vascular Surgery | Admitting: Vascular Surgery

## 2024-01-09 ENCOUNTER — Encounter (HOSPITAL_COMMUNITY): Payer: Self-pay | Admitting: Anesthesiology

## 2024-01-09 ENCOUNTER — Ambulatory Visit (HOSPITAL_COMMUNITY): Payer: Self-pay | Admitting: Anesthesiology

## 2024-01-09 ENCOUNTER — Other Ambulatory Visit (HOSPITAL_COMMUNITY): Payer: Self-pay

## 2024-01-09 DIAGNOSIS — I132 Hypertensive heart and chronic kidney disease with heart failure and with stage 5 chronic kidney disease, or end stage renal disease: Secondary | ICD-10-CM | POA: Insufficient documentation

## 2024-01-09 DIAGNOSIS — E1142 Type 2 diabetes mellitus with diabetic polyneuropathy: Secondary | ICD-10-CM | POA: Diagnosis not present

## 2024-01-09 DIAGNOSIS — Z89431 Acquired absence of right foot: Secondary | ICD-10-CM | POA: Insufficient documentation

## 2024-01-09 DIAGNOSIS — N186 End stage renal disease: Secondary | ICD-10-CM | POA: Insufficient documentation

## 2024-01-09 DIAGNOSIS — Z7901 Long term (current) use of anticoagulants: Secondary | ICD-10-CM | POA: Insufficient documentation

## 2024-01-09 DIAGNOSIS — Z87891 Personal history of nicotine dependence: Secondary | ICD-10-CM | POA: Insufficient documentation

## 2024-01-09 DIAGNOSIS — E1122 Type 2 diabetes mellitus with diabetic chronic kidney disease: Secondary | ICD-10-CM | POA: Diagnosis not present

## 2024-01-09 DIAGNOSIS — I272 Pulmonary hypertension, unspecified: Secondary | ICD-10-CM | POA: Insufficient documentation

## 2024-01-09 DIAGNOSIS — M1990 Unspecified osteoarthritis, unspecified site: Secondary | ICD-10-CM | POA: Insufficient documentation

## 2024-01-09 DIAGNOSIS — D631 Anemia in chronic kidney disease: Secondary | ICD-10-CM | POA: Insufficient documentation

## 2024-01-09 DIAGNOSIS — Z86711 Personal history of pulmonary embolism: Secondary | ICD-10-CM | POA: Diagnosis not present

## 2024-01-09 DIAGNOSIS — K219 Gastro-esophageal reflux disease without esophagitis: Secondary | ICD-10-CM | POA: Insufficient documentation

## 2024-01-09 DIAGNOSIS — Z7984 Long term (current) use of oral hypoglycemic drugs: Secondary | ICD-10-CM | POA: Insufficient documentation

## 2024-01-09 DIAGNOSIS — Z794 Long term (current) use of insulin: Secondary | ICD-10-CM | POA: Diagnosis not present

## 2024-01-09 DIAGNOSIS — K7581 Nonalcoholic steatohepatitis (NASH): Secondary | ICD-10-CM | POA: Diagnosis not present

## 2024-01-09 DIAGNOSIS — T82898A Other specified complication of vascular prosthetic devices, implants and grafts, initial encounter: Secondary | ICD-10-CM | POA: Diagnosis not present

## 2024-01-09 DIAGNOSIS — Z992 Dependence on renal dialysis: Secondary | ICD-10-CM | POA: Diagnosis not present

## 2024-01-09 DIAGNOSIS — I509 Heart failure, unspecified: Secondary | ICD-10-CM | POA: Diagnosis not present

## 2024-01-09 DIAGNOSIS — Z6841 Body Mass Index (BMI) 40.0 and over, adult: Secondary | ICD-10-CM | POA: Insufficient documentation

## 2024-01-09 DIAGNOSIS — E66813 Obesity, class 3: Secondary | ICD-10-CM | POA: Diagnosis not present

## 2024-01-09 DIAGNOSIS — G4730 Sleep apnea, unspecified: Secondary | ICD-10-CM | POA: Diagnosis not present

## 2024-01-09 HISTORY — PX: BASCILIC VEIN TRANSPOSITION: SHX5742

## 2024-01-09 LAB — POCT I-STAT, CHEM 8
BUN: 49 mg/dL — ABNORMAL HIGH (ref 6–20)
Calcium, Ion: 1.15 mmol/L (ref 1.15–1.40)
Chloride: 95 mmol/L — ABNORMAL LOW (ref 98–111)
Creatinine, Ser: 5.1 mg/dL — ABNORMAL HIGH (ref 0.61–1.24)
Glucose, Bld: 239 mg/dL — ABNORMAL HIGH (ref 70–99)
HCT: 34 % — ABNORMAL LOW (ref 39.0–52.0)
Hemoglobin: 11.6 g/dL — ABNORMAL LOW (ref 13.0–17.0)
Potassium: 4 mmol/L (ref 3.5–5.1)
Sodium: 134 mmol/L — ABNORMAL LOW (ref 135–145)
TCO2: 30 mmol/L (ref 22–32)

## 2024-01-09 LAB — GLUCOSE, CAPILLARY
Glucose-Capillary: 244 mg/dL — ABNORMAL HIGH (ref 70–99)
Glucose-Capillary: 227 mg/dL — ABNORMAL HIGH (ref 70–99)
Glucose-Capillary: 189 mg/dL — ABNORMAL HIGH (ref 70–99)

## 2024-01-09 SURGERY — TRANSPOSITION, VEIN, BASILIC
Anesthesia: General | Site: Arm Upper | Laterality: Right

## 2024-01-09 MED ORDER — ONDANSETRON HCL 4 MG/2ML IJ SOLN
INTRAMUSCULAR | Status: AC
  Filled 2024-01-09: qty 2

## 2024-01-09 MED ORDER — LIDOCAINE-EPINEPHRINE (PF) 1 %-1:200000 IJ SOLN
INTRAMUSCULAR | Status: AC
Start: 2024-01-09 — End: 2024-01-09
  Filled 2024-01-09: qty 30

## 2024-01-09 MED ORDER — HEPARIN 6000 UNIT IRRIGATION SOLUTION
Status: DC | PRN
Start: 2024-01-09 — End: 2024-01-09
  Administered 2024-01-09: 1

## 2024-01-09 MED ORDER — ONDANSETRON HCL 4 MG/2ML IJ SOLN: 4.0000 mg | Freq: Once | INTRAMUSCULAR | Status: DC | PRN

## 2024-01-09 MED ORDER — LIDOCAINE 2% (20 MG/ML) 5 ML SYRINGE
INTRAMUSCULAR | Status: AC
  Filled 2024-01-09: qty 5

## 2024-01-09 MED ORDER — FENTANYL CITRATE (PF) 100 MCG/2ML IJ SOLN
INTRAMUSCULAR | Status: AC
  Filled 2024-01-09: qty 2

## 2024-01-09 MED ORDER — FENTANYL CITRATE (PF) 100 MCG/2ML IJ SOLN
25.0000 ug | INTRAMUSCULAR | Status: DC | PRN
  Administered 2024-01-09: 50 ug via INTRAVENOUS

## 2024-01-09 MED ORDER — ORAL CARE MOUTH RINSE: 15.0000 mL | Freq: Once | OROMUCOSAL | Status: AC

## 2024-01-09 MED ORDER — LIDOCAINE HCL (PF) 1 % IJ SOLN
INTRAMUSCULAR | Status: AC
  Filled 2024-01-09: qty 30

## 2024-01-09 MED ORDER — CHLORHEXIDINE GLUCONATE 4 % EX SOLN: 60.0000 mL | Freq: Once | CUTANEOUS | Status: DC

## 2024-01-09 MED ORDER — EPHEDRINE 5 MG/ML INJ
INTRAVENOUS | Status: AC
  Filled 2024-01-09: qty 5

## 2024-01-09 MED ORDER — OXYCODONE HCL 5 MG PO TABS: 5.0000 mg | ORAL_TABLET | Freq: Once | ORAL | Status: DC | PRN

## 2024-01-09 MED ORDER — MIDAZOLAM HCL 2 MG/2ML IJ SOLN
INTRAMUSCULAR | Status: DC | PRN
  Administered 2024-01-09: 2 mg via INTRAVENOUS

## 2024-01-09 MED ORDER — PROPOFOL 10 MG/ML IV BOLUS
INTRAVENOUS | Status: AC
  Filled 2024-01-09: qty 20

## 2024-01-09 MED ORDER — MEPERIDINE HCL 25 MG/ML IJ SOLN: 6.2500 mg | INTRAMUSCULAR | Status: DC | PRN

## 2024-01-09 MED ORDER — OXYCODONE HCL 5 MG/5ML PO SOLN: 5.0000 mg | Freq: Once | ORAL | Status: DC | PRN

## 2024-01-09 MED ORDER — MIDAZOLAM HCL 2 MG/2ML IJ SOLN
INTRAMUSCULAR | Status: AC
Start: 2024-01-09 — End: 2024-01-09
  Filled 2024-01-09: qty 2

## 2024-01-09 MED ORDER — DEXAMETHASONE SODIUM PHOSPHATE 10 MG/ML IJ SOLN
INTRAMUSCULAR | Status: AC
  Filled 2024-01-09: qty 1

## 2024-01-09 MED ORDER — PHENYLEPHRINE 80 MCG/ML (10ML) SYRINGE FOR IV PUSH (FOR BLOOD PRESSURE SUPPORT)
PREFILLED_SYRINGE | INTRAVENOUS | Status: AC
Start: 2024-01-09 — End: 2024-01-09
  Filled 2024-01-09: qty 10

## 2024-01-09 MED ORDER — LIDOCAINE 2% (20 MG/ML) 5 ML SYRINGE
INTRAMUSCULAR | Status: DC | PRN
  Administered 2024-01-09: 60 mg via INTRAVENOUS

## 2024-01-09 MED ORDER — ONDANSETRON HCL 4 MG/2ML IJ SOLN
INTRAMUSCULAR | Status: DC | PRN
  Administered 2024-01-09: 4 mg via INTRAVENOUS

## 2024-01-09 MED ORDER — FENTANYL CITRATE (PF) 250 MCG/5ML IJ SOLN
INTRAMUSCULAR | Status: DC | PRN
  Administered 2024-01-09: 50 ug via INTRAVENOUS
  Administered 2024-01-09: 100 ug via INTRAVENOUS

## 2024-01-09 MED ORDER — SODIUM CHLORIDE 0.9 % IV SOLN: INTRAVENOUS | Status: DC

## 2024-01-09 MED ORDER — HEPARIN 6000 UNIT IRRIGATION SOLUTION
Status: AC
  Filled 2024-01-09: qty 500

## 2024-01-09 MED ORDER — CEFAZOLIN SODIUM-DEXTROSE 3-4 GM/150ML-% IV SOLN
3.0000 g | INTRAVENOUS | Status: AC
  Administered 2024-01-09: 3 g via INTRAVENOUS
  Filled 2024-01-09: qty 150

## 2024-01-09 MED ORDER — OXYCODONE-ACETAMINOPHEN 5-325 MG PO TABS
1.0000 | ORAL_TABLET | Freq: Four times a day (QID) | ORAL | 0 refills | Status: AC | PRN
  Filled 2024-01-09: qty 20, 5d supply, fill #0

## 2024-01-09 MED ORDER — CHLORHEXIDINE GLUCONATE 0.12 % MT SOLN
15.0000 mL | Freq: Once | OROMUCOSAL | Status: AC
  Administered 2024-01-09: 15 mL via OROMUCOSAL
  Filled 2024-01-09: qty 15

## 2024-01-09 MED ORDER — 0.9 % SODIUM CHLORIDE (POUR BTL) OPTIME
TOPICAL | Status: DC | PRN
Start: 2024-01-09 — End: 2024-01-09
  Administered 2024-01-09: 1000 mL

## 2024-01-09 MED ORDER — PROPOFOL 10 MG/ML IV BOLUS
INTRAVENOUS | Status: DC | PRN
  Administered 2024-01-09: 200 mg via INTRAVENOUS

## 2024-01-09 MED ORDER — DEXAMETHASONE SODIUM PHOSPHATE 10 MG/ML IJ SOLN
INTRAMUSCULAR | Status: DC | PRN
  Administered 2024-01-09: 5 mg via INTRAVENOUS

## 2024-01-09 MED ORDER — FENTANYL CITRATE (PF) 250 MCG/5ML IJ SOLN
INTRAMUSCULAR | Status: AC
Start: 2024-01-09 — End: 2024-01-09
  Filled 2024-01-09: qty 5

## 2024-01-09 MED ORDER — PHENYLEPHRINE HCL-NACL 20-0.9 MG/250ML-% IV SOLN
INTRAVENOUS | Status: DC | PRN
  Administered 2024-01-09: 25 ug/min via INTRAVENOUS

## 2024-01-09 SURGICAL SUPPLY — 26 items
ARMBAND PINK RESTRICT EXTREMIT (MISCELLANEOUS) ×1 IMPLANT
BAG COUNTER SPONGE SURGICOUNT (BAG) ×1 IMPLANT
CANISTER SUCTION 3000ML PPV (SUCTIONS) ×1 IMPLANT
CLIP LIGATING EXTRA MED SLVR (CLIP) ×1 IMPLANT
CLIP LIGATING EXTRA SM BLUE (MISCELLANEOUS) ×1 IMPLANT
COVER PROBE W GEL 5X96 (DRAPES) ×1 IMPLANT
DERMABOND ADVANCED .7 DNX12 (GAUZE/BANDAGES/DRESSINGS) ×1 IMPLANT
ELECTRODE REM PT RTRN 9FT ADLT (ELECTROSURGICAL) ×1 IMPLANT
GLOVE BIO SURGEON STRL SZ7.5 (GLOVE) ×1 IMPLANT
GOWN STRL REUS W/ TWL LRG LVL3 (GOWN DISPOSABLE) ×2 IMPLANT
GOWN STRL REUS W/ TWL XL LVL3 (GOWN DISPOSABLE) ×1 IMPLANT
KIT BASIN OR (CUSTOM PROCEDURE TRAY) ×1 IMPLANT
KIT TURNOVER KIT B (KITS) ×1 IMPLANT
NS IRRIG 1000ML POUR BTL (IV SOLUTION) ×1 IMPLANT
PACK CV ACCESS (CUSTOM PROCEDURE TRAY) ×1 IMPLANT
PAD ARMBOARD POSITIONER FOAM (MISCELLANEOUS) ×2 IMPLANT
POWDER SURGICEL 3.0 GRAM (HEMOSTASIS) IMPLANT
SLING ARM FOAM STRAP LRG (SOFTGOODS) IMPLANT
SUT MNCRL AB 4-0 PS2 18 (SUTURE) ×1 IMPLANT
SUT PROLENE 6 0 BV (SUTURE) ×1 IMPLANT
SUT SILK 2 0 SH (SUTURE) IMPLANT
SUT SILK 2-0 18XBRD TIE 12 (SUTURE) IMPLANT
SUT VIC AB 3-0 SH 27X BRD (SUTURE) ×1 IMPLANT
TOWEL GREEN STERILE (TOWEL DISPOSABLE) ×1 IMPLANT
UNDERPAD 30X36 HEAVY ABSORB (UNDERPADS AND DIAPERS) ×1 IMPLANT
WATER STERILE IRR 1000ML POUR (IV SOLUTION) ×1 IMPLANT

## 2024-01-09 NOTE — Interval H&P Note (Signed)
 History and Physical Interval Note:  01/09/2024 9:29 AM  Sean Valentine  has presented today for surgery, with the diagnosis of esrd.  The various methods of treatment have been discussed with the patient and family. After consideration of risks, benefits and other options for treatment, the patient has consented to  Procedure(s): TRANSPOSITION, VEIN, BASILIC (Right) as a surgical intervention.  The patient's history has been reviewed, patient examined, no change in status, stable for surgery.  I have reviewed the patient's chart and labs.  Questions were answered to the patient's satisfaction.     [name]

## 2024-01-09 NOTE — Op Note (Signed)
      Patient name: Sean Valentine MRN: [id number] DOB: 1965/07/26 Sex: male  01/09/2024 Pre-operative Diagnosis: End-stage renal disease Post-operative diagnosis:  Same Surgeon:  Penne C. Sheree, MD Assistant: Lucie Apt, PA Procedure Performed: Right arm basilic vein fistula revision with transposition  Indications: 58 year old male with history of end-stage renal disease currently dialyzing via catheter.  He has a previous transposed cephalic vein in the right upper arm now with a for stage basilic vein which has very high flow.  He is indicated for revision with transposition.  Experience assistant was necessary to facilitate exposure of the vein as well as ligation of branches tunneling laterally and performing anastomosis.  Findings: The vein throughout its course measured approximately 1 cm with very strong flow.  It was quite short due to the size of his large arm and was tunneled medially on the upper arm and a superficial plane and sewn end to end.  At completion there was a very strong thrill in the fistula and a palpable radial artery pulse that was both confirmed with Doppler.   Procedure:  The patient was identified in the holding area and taken to the operating room where LMA anesthesia was induced.  He were sterilely prepped and draped in the right upper extremity in usual fashion, antibiotics were administered and a timeout was called.  We began using ultrasound we identified the vein throughout its course and marked 3 incisions on the upper arm.  We then started in the middle of the upper arm with an vertical incision dissected down through the deep fascia identified the nerve protected this throughout its course identified the vein and dissected this cephalad and caudally as far as we could reach through the incision.  We divided branches tween clips and ties.  We then reopen the incision near the anastomosis dissected down identified the fistula the way back to the level of  anastomosis.  A third incision was created towards the axilla and we dissected out the fistula and again all branches were divided between clips and ties.  The vein was marked for orientation.  Was clamped to the anastomosis and transected 2 cm distal to this.  We then flushed with heparinized saline tunnel and laterally although it was very short given the size of his large upper arm.  We then spatulated both ends and send them end-to-end with 6-0 Prolene suture.  Prior completion Flushing all directions.  Upon completion there was a very strong thrill.  Unfortunately there was a thin area near the axilla that required repair with interrupted 6-0 Prolene suture.  After this was repaired there was a very strong thrill throughout the fistula confirmed with Doppler both of the antecubitum and the axilla and there was a palpable radial artery pulse at the wrist also confirmed with Doppler.  Satisfied with this we irrigated the wounds obtained stasis closed in layers with Vicryl and Monocryl.  Dermabond was placed at the skin level.  The patient was awakened from anesthesia having tolerated the procedure without any complication.  Counts were correct at completion  EBL: 100 cc    Bentzion Dauria C. Sheree, MD [facility] [place] Office: [phone] Pager: [phone]

## 2024-01-09 NOTE — Progress Notes (Signed)
 Dr. Mallory made aware of pts CBG 244 and that pt has an insulin  pump in place. No diabetic protocol at this time.

## 2024-01-09 NOTE — Transfer of Care (Signed)
 Immediate Anesthesia Transfer of Care Note  Patient: Sean Valentine  Procedure(s) Performed: RIGHT BASILIC VEIN TRANSPOSITION (Right: Arm Upper)  Patient Location: PACU  Anesthesia Type:General  Level of Consciousness: awake, alert , and oriented  Airway & Oxygen Therapy: Patient Spontanous Breathing and Patient connected to face mask oxygen  Post-op Assessment: Report given to RN and Post -op Vital signs reviewed and stable  Post vital signs: Reviewed and stable  Last Vitals:  Vitals Value Taken Time  BP    Temp    Pulse    Resp    SpO2      Last Pain:  Vitals:   01/09/24 0748  TempSrc: Oral         Complications: No notable events documented.

## 2024-01-09 NOTE — H&P (Deleted)
    Patient name: Sean Valentine MRN: [id number] DOB: 1965/07/26 Sex: male  01/09/2024 Pre-operative Diagnosis: End-stage renal disease Post-operative diagnosis:  Same Surgeon:  Penne C. Sheree, MD Assistant: Lucie Apt, PA Procedure Performed: Right arm basilic vein fistula revision with transposition  Indications: 58 year old male with history of end-stage renal disease currently dialyzing via catheter.  He has a previous transposed cephalic vein in the right upper arm now with a for stage basilic vein which has very high flow.  He is indicated for revision with transposition.  Experience assistant was necessary to facilitate exposure of the vein as well as ligation of branches tunneling laterally and performing anastomosis.  Findings: The vein throughout its course measured approximately 1 cm with very strong flow.  It was quite short due to the size of his large arm and was tunneled medially on the upper arm and a superficial plane and sewn end to end.  At completion there was a very strong thrill in the fistula and a palpable radial artery pulse that was both confirmed with Doppler.   Procedure:  The patient was identified in the holding area and taken to the operating room where LMA anesthesia was induced.  He were sterilely prepped and draped in the right upper extremity in usual fashion, antibiotics were administered and a timeout was called.  We began using ultrasound we identified the vein throughout its course and marked 3 incisions on the upper arm.  We then started in the middle of the upper arm with an vertical incision dissected down through the deep fascia identified the nerve protected this throughout its course identified the vein and dissected this cephalad and caudally as far as we could reach through the incision.  We divided branches tween clips and ties.  We then reopen the incision near the anastomosis dissected down identified the fistula the way back to the level of  anastomosis.  A third incision was created towards the axilla and we dissected out the fistula and again all branches were divided between clips and ties.  The vein was marked for orientation.  Was clamped to the anastomosis and transected 2 cm distal to this.  We then flushed with heparinized saline tunnel and laterally although it was very short given the size of his large upper arm.  We then spatulated both ends and send them end-to-end with 6-0 Prolene suture.  Prior completion Flushing all directions.  Upon completion there was a very strong thrill.  Unfortunately there was a thin area near the axilla that required repair with interrupted 6-0 Prolene suture.  After this was repaired there was a very strong thrill throughout the fistula confirmed with Doppler both of the antecubitum and the axilla and there was a palpable radial artery pulse at the wrist also confirmed with Doppler.  Satisfied with this we irrigated the wounds obtained stasis closed in layers with Vicryl and Monocryl.  Dermabond was placed at the skin level.  The patient was awakened from anesthesia having tolerated the procedure without any complication.  Counts were correct at completion  EBL: 100 cc    Sean Valentine C. Sheree, MD [facility] [place] Office: [phone] Pager: [phone]

## 2024-01-09 NOTE — Discharge Instructions (Signed)
   [facility]  Discharge Instructions  AV Fistula or Graft Surgery for Dialysis Access  Please refer to the following instructions for your post-procedure care. Your surgeon or physician assistant will discuss any changes with you.  Activity  You may drive the day following your surgery, if you are comfortable and no longer taking prescription pain medication. Resume full activity as the soreness in your incision resolves.  Bathing/Showering  You may shower after you go home. Keep your incision dry for 48 hours. Do not soak in a bathtub, hot tub, or swim until the incision heals completely. You may not shower if you have a hemodialysis catheter.  Incision Care  Clean your incision with mild soap and water after 48 hours. Pat the area dry with a clean towel. You do not need a bandage unless otherwise instructed. Do not apply any ointments or creams to your incision. You may have skin glue on your incision. Do not peel it off. It will come off on its own in about one week. Your arm may swell a bit after surgery. To reduce swelling use pillows to elevate your arm so it is above your heart. Your doctor will tell you if you need to lightly wrap your arm with an ACE bandage.  Diet  Resume your normal diet. There are not special food restrictions following this procedure. In order to heal from your surgery, it is CRITICAL to get adequate nutrition. Your body requires vitamins, minerals, and protein. Vegetables are the best source of vitamins and minerals. Vegetables also provide the perfect balance of protein. Processed food has little nutritional value, so try to avoid this.  Medications  Resume taking all of your medications. If your incision is causing pain, you may take over-the counter pain relievers such as acetaminophen  (Tylenol ). If you were prescribed a stronger pain medication, please be aware these medications can cause nausea and constipation. Prevent  nausea by taking the medication with a snack or meal. Avoid constipation by drinking plenty of fluids and eating foods with high amount of fiber, such as fruits, vegetables, and grains.  Do not take Tylenol  if you are taking prescription pain medications.  Follow up Your surgeon may want to see you in the office following your access surgery. If so, this will be arranged at the time of your surgery.  Please call us  immediately for any of the following conditions:  Increased pain, redness, drainage (pus) from your incision site Fever of 101 degrees or higher Severe or worsening pain at your incision site Hand pain or numbness.  Reduce your risk of vascular disease:  Stop smoking. If you would like help, call [facility] at 1-800-QUIT-NOW (302-278-0345) or [place] at 6465189657  Manage your cholesterol Maintain a desired weight Control your diabetes Keep your blood pressure down  Dialysis  It will take several weeks to several months for your new dialysis access to be ready for use. Your surgeon will determine when it is okay to use it. Your nephrologist will continue to direct your dialysis. You can continue to use your Permcath until your new access is ready for use.   01/09/2024 Sean Valentine [id number] 01-19-1966  Surgeon(s): Sheree Penne Bruckner, MD  Procedure(s): RIGHT BASILIC VEIN TRANSPOSITION  x Do not stick fistula for 6 weeks    If you have any questions, please call the office at [phone].

## 2024-01-09 NOTE — Progress Notes (Signed)
 Wasted 50mcg of fentanyl  with Sheppard Teresa PEAK.

## 2024-01-09 NOTE — Anesthesia Postprocedure Evaluation (Signed)
 Anesthesia Post Note  Patient: Sean Valentine  Procedure(s) Performed: RIGHT BASILIC VEIN TRANSPOSITION (Right: Arm Upper)     Patient location during evaluation: PACU Anesthesia Type: General Level of consciousness: awake and alert Pain management: pain level controlled Vital Signs Assessment: post-procedure vital signs reviewed and stable Respiratory status: spontaneous breathing, nonlabored ventilation, respiratory function stable and patient connected to nasal cannula oxygen Cardiovascular status: blood pressure returned to baseline and stable Postop Assessment: no apparent nausea or vomiting Anesthetic complications: no   No notable events documented.  Last Vitals:  Vitals:   01/09/24 0748 01/09/24 1230  BP: (!) 136/43 (!) 148/79  Pulse: 89 91  Resp: 20 18  Temp: 36.8 C 36.8 C  SpO2: 99% 96%    Last Pain:  Vitals:   01/09/24 1241  TempSrc:   PainSc: 6                  [name]

## 2024-01-09 NOTE — Anesthesia Procedure Notes (Signed)
 Procedure Name: LMA Insertion Date/Time: 01/09/2024 10:31 AM  Performed by: Elby Raelene SAUNDERS, CRNAPre-anesthesia Checklist: Patient identified, Emergency Drugs available, Suction available and Patient being monitored Patient Re-evaluated:Patient Re-evaluated prior to induction Oxygen Delivery Method: Circle System Utilized Preoxygenation: Pre-oxygenation with 100% oxygen Induction Type: IV induction Ventilation: Mask ventilation without difficulty LMA: LMA inserted LMA Size: 5.0 Number of attempts: 1 Placement Confirmation: positive ETCO2 Tube secured with: Tape Dental Injury: Teeth and Oropharynx as per pre-operative assessment

## 2024-01-09 NOTE — Anesthesia Preprocedure Evaluation (Addendum)
 Anesthesia Evaluation  Patient identified by MRN, date of birth, ID band Patient awake    Reviewed: Allergy & Precautions, NPO status , Patient's Chart, lab work & pertinent test results, reviewed documented beta blocker date and time   Airway Mallampati: II  TM Distance: >3 FB Neck ROM: Full    Dental  (+) Poor Dentition, Dental Advisory Given,    Pulmonary asthma , sleep apnea and Continuous Positive Airway Pressure Ventilation , former smoker, PE   Pulmonary exam normal breath sounds clear to auscultation       Cardiovascular hypertension, Pt. on medications and Pt. on home beta blockers pulmonary hypertension+ Peripheral Vascular Disease and +CHF  Normal cardiovascular exam Rhythm:Regular Rate:Normal  TTE 04/08/23 [facility]): Summary  1. Technically difficult study.  2. The left ventricle is normal in size with normal wall thickness.  3. The left ventricular systolic function is overall normal, LVEF is  visually estimated at > 55%.  4. There is decreased contractile function involving the apical segment(s).  5. The right ventricle is normal in size, with normal systolic function.    Nuclear stress test 09/20/20 [facility]): IMPRESSION: 1. No reversible ischemia or infarction. 2. Normal left ventricular wall motion. 3. Left ventricular ejection fraction 62% 4. 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   Abdominal  (+) + obese  Peds  Hematology  (+) Blood  dyscrasia, anemia Eliquis therapy- last dose   Anesthesia Other Findings   Reproductive/Obstetrics                              Anesthesia Physical Anesthesia Plan  ASA: 4  Anesthesia Plan: General   Post-op Pain Management: Minimal or no pain anticipated   Induction: Intravenous  PONV Risk Score and Plan: 2 and Treatment may vary due to age or medical condition and Propofol  infusion  Airway Management Planned: LMA  Additional Equipment: None  Intra-op Plan:   Post-operative Plan:   Informed Consent: I have reviewed the patients History and Physical, chart, labs and discussed the procedure including the risks, benefits and alternatives for the proposed anesthesia with the patient or authorized representative who has indicated his/her understanding and acceptance.     Dental advisory given  Plan Discussed with: CRNA and Anesthesiologist  Anesthesia Plan Comments: (PAT note written 11/10/2023 by Isaiah Ruder, PA-C.  DISCUSSION: Patient is a 58 year old male scheduled for the above procedure. S/p right internal jugular Palindrome TDC on 08/29/23 after unsuccessful attempt at right brachiocephalic AVF thrombectomy.    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    He has been followed by  cardiologist Dr. Krasowski for diastolic CHF and HTN.  Last cardiology was on 03/13/23 with Carlin Nest, NP. On dialysis ~ 2023. Had pending [facility] transplant team  evaluation at that time. Denied chest pain, palpitations, dyspnea, syncope, edema. One year follow-up planned.    Cardiac testing has included a non-ischemic nuclear stress test on 09/20/20, EF 62%. 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 Most recent TTE was on 04/08/23 as part of renal transplant evaluation showed LVEF normal, visually estimated at > 55%, decreased contractile function involving the apical segments, normal RV size and function, no significant valvular abnormalities. No additional pre-transplant cardiac testing done as he was not felt to be a renal transplant candidate as of 06/19/23 [facility] Renal Transplant Team: Mr. Rathman is a very pleasant 58 year old male seeking evaluation for kidney transplant. He unfortunately is very frail at today's visit. He requires a cane to ambulate, assistance to get up from a chair and endorses significant fatigue. I had a thorough conversation with the patient and his wife that kidney transplant would not be a safe decision for him at this time due to his fraility. If his fraility and functional status improve, he is welcome to be re-referred and we would be happy to evaluate him again.    )        Anesthesia Quick Evaluation

## 2024-01-10 ENCOUNTER — Encounter (HOSPITAL_COMMUNITY): Payer: Self-pay | Admitting: Vascular Surgery

## 2024-01-15 ENCOUNTER — Telehealth: Payer: Self-pay

## 2024-01-15 NOTE — Telephone Encounter (Signed)
 Pt called with c/o pain and swelling s/p 2nd stage BVT 6 days ago. He has been trying to elevate it but not very high. I have advised him to really get it elevated today above heart level to see if that helps with swelling. He states he feels fine otherwise, no fever, incision is without redness/drainage/pus. He states his fingers felt numb somewhat since the first surgery a few months ago. Fingers/hand are warm and he is able to use them and has normal grip. Advised him to call us  if he does not feel the swelling is improving or worsening or anything else changes-he verbalized understanding of the above.

## 2024-01-27 NOTE — Progress Notes (Unsigned)
    Postoperative Access Visit   History of Present Illness   Sean Valentine is a 58 y.o. year old male who presents for postoperative follow-up for: Right arm basilic vein fistula revision with transposition on 01/09/24 by Dr. Sheree.  The patient's wounds are slowly healing.  The patient notes no steal symptoms but he is having a lot of incisional pain. He did develop redness and drainage from the axillary incision > 1 week ago. He says the drainage has been clear. The drainage was cultured at HD, and report that he brought to today's visit shows Moderate gram negative bacilli, Rare GPC in pairs and rare GP bacilli. He was started on Ceftazidime last week which he has been getting at every HD session. He does feel that it is improving some. He denies any fever or chills.   He currently dialyzes via TDC on [facility] at the [facility] location  Physical Examination   Vitals:   01/28/24 1017  BP: 120/74  Pulse: 86  Temp: 97.9 F (36.6 C)  TempSrc: Temporal  Weight: (!) 340 lb 6.4 oz (154.4 kg)   Body mass index is 40.37 kg/m.  right arm Incisions are healing well. There is very small area of dehiscence in the axillary incision and mild erythema in the mid upper arm incision. No active drainage. No expressible drainage. 2+ radial pulse, hand grip is 5/5, sensation in digits is intact, palpable thrill, bruit can be auscultated     Medical Decision Making   Sean Valentine is a 58 y.o. year old male who presents s/p Right arm basilic vein fistula revision with transposition on 01/09/24 by Dr. Sheree. Incisions are intact and healing well. It sounds like he developed a seroma that then became infected. He is getting IV Ceftazidime at HD. This appears to be improving. Fortunately this is a fistula so there is no prosthetic material in his arm.  He is having some incisional pain but he is without signs or symptoms of steal syndrome. The fistula has excellent thrill. I will have him return in 2 weeks for an incision  check. He understands to call for earlier follow up if new or worsening symptoms   Teretha Damme, PA-C [facility] [place] Office: [phone]  Clinic MD: Sheree

## 2024-01-28 ENCOUNTER — Ambulatory Visit: Attending: Vascular Surgery | Admitting: Physician Assistant

## 2024-01-28 VITALS — BP 120/74 | HR 86 | Temp 97.9°F | Wt 340.4 lb

## 2024-01-28 DIAGNOSIS — T8149XA Infection following a procedure, other surgical site, initial encounter: Secondary | ICD-10-CM

## 2024-01-28 DIAGNOSIS — N186 End stage renal disease: Secondary | ICD-10-CM

## 2024-02-17 ENCOUNTER — Ambulatory Visit: Attending: Vascular Surgery | Admitting: Physician Assistant

## 2024-02-17 VITALS — BP 163/79 | HR 92 | Temp 97.8°F | Resp 18 | Ht 77.0 in | Wt 341.3 lb

## 2024-02-17 DIAGNOSIS — N186 End stage renal disease: Secondary | ICD-10-CM

## 2024-02-17 NOTE — Progress Notes (Signed)
 POST OPERATIVE OFFICE NOTE    CC:  F/u for surgery  HPI:  This is a 58 y.o. male who is s/p second stage basilic vein transposition in the right arm by Dr. Sheree on 01/09/2024.  He developed a seroma at the axilla incision and was started on antibiotics based on culture data.  The redness and drainage has now resolved.  He believes the incision has completely healed.  He is dialyzing via right IJ [facility] on a Monday Wednesday Friday schedule in [place].  He denies any pain or cold feeling in his right hand.  No Known Allergies  Current Outpatient Medications  Medication Sig Dispense Refill   acetaminophen  (TYLENOL ) 500 MG tablet Take 500 mg by mouth every 6 (six) hours as needed for moderate pain or mild pain.     apixaban (ELIQUIS) 5 MG TABS tablet Take 5 mg by mouth 2 (two) times daily.     atorvastatin (LIPITOR) 10 MG tablet Take 10 mg by mouth at bedtime.     bisacodyl (DULCOLAX) 5 MG EC tablet Take 5 mg by mouth daily as needed for moderate constipation.     carvedilol  (COREG ) 25 MG tablet Take 1 tablet (25 mg total) by mouth 2 (two) times daily with a meal. (Patient taking differently: Take 50 mg by mouth See admin instructions. Take 25 mg by mouth twice daily on Monday, Wednesday and Friday) 90 tablet 1   cinacalcet (SENSIPAR) 30 MG tablet Take 30 mg by mouth every evening.     icosapent Ethyl (VASCEPA) 1 g capsule Take 2 g by mouth 2 (two) times daily.     insulin  lispro (HUMALOG) 100 UNIT/ML KwikPen Inject into the skin continuous. Via pump     Methoxy PEG-Epoetin Beta (MIRCERA IJ) Mircera     nitroGLYCERIN  (NITROSTAT ) 0.4 MG SL tablet Place 0.4 mg under the tongue every 5 (five) minutes as needed for chest pain.     oxyCODONE -acetaminophen  (PERCOCET) 5-325 MG tablet Take 1 tablet by mouth every 6 (six) hours as needed for severe pain (pain score 7-10). 20 tablet 0   OZEMPIC, 0.25 OR 0.5 MG/DOSE, 2 MG/3ML SOPN Inject 0.5 mg into the skin once a week.     promethazine (PHENERGAN) 25 MG  tablet Take 25 mg by mouth every 6 (six) hours as needed for nausea or vomiting.     sucroferric oxyhydroxide (VELPHORO) 500 MG chewable tablet Chew 1,500 mg by mouth 3 (three) times daily with meals.     Tenapanor HCl, CKD, (XPHOZAH) 30 MG TABS Take 30 mg by mouth 2 (two) times daily.     torsemide  (DEMADEX ) 100 MG tablet Take 100 mg by mouth daily as needed (trouble urinating).     No current facility-administered medications for this visit.     ROS:  See HPI  Physical Exam:  Vitals:   02/17/24 0923  BP: (!) 163/79  Pulse: 92  Resp: 18  Temp: 97.8 F (36.6 C)  TempSrc: Temporal  Weight: (!) 341 lb 4.8 oz (154.8 kg)  Height: 6' 5 (1.956 m)    Incision:  R arm incisions c/d/i Extremities:  palpable thrill R AVF; symmetrical grip strength Neuro: A&O  Assessment/Plan:  This is a 58 y.o. male who is s/p: Second stage basilic vein transposition  Patent right basilic vein fistula without signs or symptoms of steal syndrome All incisions have healed; axilla incision redness and drainage has resolved Okay to begin cannulating fistula starting tomorrow 02/18/24 [facility] can be removed when nephrology is  comfortable with the performance of the fistula Follow-up on an as-needed basis   Donnice Sender, PA-C [facility] [phone]  Clinic MD:  [place]

## 2024-05-17 ENCOUNTER — Encounter (HOSPITAL_COMMUNITY): Admission: RE | Disposition: A | Payer: Self-pay | Source: Home / Self Care | Attending: Vascular Surgery

## 2024-05-17 ENCOUNTER — Ambulatory Visit (HOSPITAL_COMMUNITY)
Admission: RE | Admit: 2024-05-17 | Discharge: 2024-05-17 | Disposition: A | Attending: Vascular Surgery | Admitting: Vascular Surgery

## 2024-05-17 ENCOUNTER — Other Ambulatory Visit: Payer: Self-pay

## 2024-05-17 DIAGNOSIS — E1122 Type 2 diabetes mellitus with diabetic chronic kidney disease: Secondary | ICD-10-CM | POA: Insufficient documentation

## 2024-05-17 DIAGNOSIS — Z992 Dependence on renal dialysis: Secondary | ICD-10-CM | POA: Diagnosis not present

## 2024-05-17 DIAGNOSIS — T82868A Thrombosis of vascular prosthetic devices, implants and grafts, initial encounter: Secondary | ICD-10-CM | POA: Diagnosis present

## 2024-05-17 DIAGNOSIS — I132 Hypertensive heart and chronic kidney disease with heart failure and with stage 5 chronic kidney disease, or end stage renal disease: Secondary | ICD-10-CM | POA: Diagnosis not present

## 2024-05-17 DIAGNOSIS — Y832 Surgical operation with anastomosis, bypass or graft as the cause of abnormal reaction of the patient, or of later complication, without mention of misadventure at the time of the procedure: Secondary | ICD-10-CM | POA: Insufficient documentation

## 2024-05-17 DIAGNOSIS — N186 End stage renal disease: Secondary | ICD-10-CM | POA: Insufficient documentation

## 2024-05-17 DIAGNOSIS — I509 Heart failure, unspecified: Secondary | ICD-10-CM | POA: Insufficient documentation

## 2024-05-17 HISTORY — PX: DIALYSIS/PERMA CATHETER INSERTION: CATH118288

## 2024-05-17 SURGERY — DIALYSIS/PERMA CATHETER INSERTION
Anesthesia: LOCAL

## 2024-05-17 MED ORDER — MIDAZOLAM HCL 2 MG/2ML IJ SOLN
INTRAMUSCULAR | Status: AC
  Filled 2024-05-17: qty 2

## 2024-05-17 MED ORDER — FENTANYL CITRATE (PF) 100 MCG/2ML IJ SOLN
INTRAMUSCULAR | Status: AC
  Filled 2024-05-17: qty 2

## 2024-05-17 MED ORDER — FENTANYL CITRATE (PF) 100 MCG/2ML IJ SOLN
INTRAMUSCULAR | Status: DC | PRN
  Administered 2024-05-17: 09:00:00 50 ug via INTRAVENOUS

## 2024-05-17 MED ORDER — MIDAZOLAM HCL (PF) 2 MG/2ML IJ SOLN
INTRAMUSCULAR | Status: DC | PRN
  Administered 2024-05-17: 09:00:00 1 mg via INTRAVENOUS

## 2024-05-17 MED ORDER — HEPARIN (PORCINE) IN NACL 1000-0.9 UT/500ML-% IV SOLN
INTRAVENOUS | Status: DC | PRN
  Administered 2024-05-17: 09:00:00 500 mL

## 2024-05-17 MED ORDER — HEPARIN SODIUM (PORCINE) 1000 UNIT/ML IJ SOLN
INTRAMUSCULAR | Status: DC | PRN
  Administered 2024-05-17 (×2): 1900 [IU] via INTRAVENOUS

## 2024-05-17 MED ORDER — LIDOCAINE HCL (PF) 1 % IJ SOLN
INTRAMUSCULAR | Status: AC
  Filled 2024-05-17: qty 30

## 2024-05-17 MED ORDER — LIDOCAINE HCL (PF) 1 % IJ SOLN
INTRAMUSCULAR | Status: DC | PRN
  Administered 2024-05-17: 09:00:00 10 mL
  Administered 2024-05-17: 09:00:00 15 mL

## 2024-05-17 MED ORDER — HEPARIN SODIUM (PORCINE) 1000 UNIT/ML IJ SOLN
INTRAMUSCULAR | Status: AC
  Filled 2024-05-17: qty 10

## 2024-05-17 SURGICAL SUPPLY — 8 items
CATH PALINDROME-P 19CM W/VT (CATHETERS) IMPLANT
CATH PALINDROME-P 23 W/VT (CATHETERS) IMPLANT
GLIDEWIRE STIFF .35X180X3 HYDR (WIRE) IMPLANT
KIT MICROPUNCTURE NIT STIFF (SHEATH) IMPLANT
MAT PREVALON FULL STRYKER (MISCELLANEOUS) IMPLANT
SHEATH PEELAWAY 16FR (SHEATH) IMPLANT
SHEATH PROBE COVER 6X72 (BAG) IMPLANT
TRAY PV CATH (CUSTOM PROCEDURE TRAY) ×1 IMPLANT

## 2024-05-17 NOTE — H&P (Signed)
 [facility] OF [place]  ASSESSMENT / PLAN: 58 y.o. male with thrombosed right arm BB AVF. Plan placement of tunneled dialysis catheter today in cath lab to restore dialysis access. Will need vein mapping and outpatient appt to discuss permanent dialysis access.   CHIEF COMPLAINT: ESRD, thrombosed fistula  HISTORY OF PRESENT ILLNESS: Sean Valentine is a 58 y.o. male with end-stage renal disease on dialysis through a right arm AV fistula.  This is thrombosed.  His last successful dialysis was last week.  I counseled the patient that thrombectomy would not likely restore patency for fistula has been thrombosed more than 48 hours.  I recommend tunneled dialysis catheter placement.  The patient was in agreement.   Past Medical History:  Diagnosis Date   Anemia, chronic disease 05/26/2018   Formatting of this note might be different from the original. Eval with Lewis and phlebotomy for Hemochromotosis.   Anticoagulant long-term use 11/26/2016   Arthritis of right foot 05/08/2016   Atypical chest pain 09/13/2020   Bacteremia due to group B Streptococcus 11/03/2020   Benign hypertension with CKD (chronic kidney disease) stage III (HCC) 07/12/2019   Bilateral leg edema    Cellulitis 11/26/2020   Chest pain at rest 08/16/2020   CHF (congestive heart failure) (HCC) 03/05/2012   COVID-19 virus infection 02/09/2020   Deformity of toe 08/01/2020   Diabetic ulcer of toe of right foot associated with type 2 diabetes mellitus, with fat layer exposed (HCC) 01/10/2020   Formatting of this note might be different from the original. Distal tuft right fifth toe   DVT (deep venous thrombosis) (HCC) 08/2016   LLE   Dyslipidemia 03/05/2012   Essential hypertension 11/26/2016   GERD (gastroesophageal reflux disease) 08/01/2020   Gouty arthritis of left hand 01/15/2017   Hemochromatosis    High risk medication use 11/26/2016   History of COVID-19 05/05/2020   Formatting of this note might  be different from the original. 02/2020   History of diabetic ulcer of foot 04/04/2021   History of kidney stones    History of pulmonary embolism 11/26/2016   Formatting of this note might be different from the original. Felt severe with permanent anticoagulation and Filter placed because of burden.   Hypertension    Kidney failure    stage 4   Localized edema 03/05/2012   LVH (left ventricular hypertrophy) 05/26/2018   Formatting of this note might be different from the original. 04/2018 EF 555-60%   Malaise and fatigue 11/26/2016   Medication monitoring encounter 11/15/2020   Metabolic acidosis 01/21/2022   MGUS (monoclonal gammopathy of unknown significance) 06/03/2018   Formatting of this note might be different from the original. Possible. Seen on SPEP 05/2018   Mild persistent asthma 11/26/2016   Mixed hyperlipidemia 11/26/2016   Morbid (severe) obesity due to excess calories (HCC) 03/05/2012   Morbid obesity with body mass index (BMI) of 40.0 to 49.9 (HCC) 11/26/2016   Nail dystrophy 05/20/2018   Nephrolithiasis 03/05/2012   Nonalcoholic steatohepatitis (NASH) 05/26/2018   Obesity, Class III, BMI 40-49.9 (morbid obesity) 11/26/2016   Other hemochromatosis 03/21/2020   PICC (peripherally inserted central catheter) in place 12/12/2020   Posterior tibial tendinitis of right lower extremity 08/01/2020   Pre-ulcerative calluses 06/18/2019   Primary central sleep apnea 11/26/2016   Formatting of this note might be different from the original. bipap   Sepsis (HCC) 10/30/2020   Sepsis due to cellulitis (HCC) 10/30/2020   Sleep apnea 03/05/2012   Formatting of  this note might be different from the original. [place].   Spinal stenosis of lumbar region 05/26/2018   Status post amputation of toe of right foot 07/26/2015   Subfibular impingement, right 03/13/2016   Tinea pedis 08/01/2020   Type 2 diabetes mellitus with stage 3b chronic kidney disease, with long-term current use of  insulin  (HCC) 07/12/2019   Formatting of this note might be different from the original. IMO 10/01 Updates  Formatting of this note might be different from the original. 2000   Ulcer of right foot with fat layer exposed (HCC) 12/05/2020    Past Surgical History:  Procedure Laterality Date   A/V FISTULAGRAM Right 07/22/2022   Procedure: A/V Fistulagram;  Surgeon: Sheree Penne Bruckner, MD;  Location: [facility] INVASIVE CV LAB;  Service: Cardiovascular;  Laterality: Right;   A/V FISTULAGRAM Right 10/21/2022   Procedure: A/V Fistulagram;  Surgeon: Sheree Penne Bruckner, MD;  Location: [facility] INVASIVE CV LAB;  Service: Cardiovascular;  Laterality: Right;   A/V FISTULAGRAM Right 02/17/2023   Procedure: A/V Fistulagram;  Surgeon: Eliza Bruckner RAMAN, MD;  Location: [facility] INVASIVE CV LAB;  Service: Cardiovascular;  Laterality: Right;   A/V FISTULAGRAM N/A 07/08/2023   Procedure: A/V Fistulagram;  Surgeon: Norine Manuelita LABOR, MD;  Location: [facility] INVASIVE CV LAB;  Service: Cardiovascular;  Laterality: N/A;   AV FISTULA PLACEMENT Right 05/17/2022   Procedure: RIGHT BRACHIOCEPHALIC ARTERIOVENOUS (AV) FISTULA CREATION;  Surgeon: Sheree Penne Bruckner, MD;  Location: [facility] OR;  Service: Vascular;  Laterality: Right;  regional with MAC   AV FISTULA PLACEMENT Right 11/11/2023   Procedure: ARTERIOVENOUS (AV) FISTULA CREATION;  Surgeon: Sheree Penne Bruckner, MD;  Location: [facility] OR;  Service: Vascular;  Laterality: Right;   BASCILIC VEIN TRANSPOSITION Right 01/09/2024   Procedure: RIGHT BASILIC VEIN TRANSPOSITION;  Surgeon: Sheree Penne Bruckner, MD;  Location: [facility] OR;  Service: Vascular;  Laterality: Right;   BUBBLE STUDY  11/03/2020   Procedure: BUBBLE STUDY;  Surgeon: Barbaraann Darryle Ned, MD;  Location: [facility] ENDOSCOPY;  Service: Cardiovascular;;   CATARACT EXTRACTION, BILATERAL Bilateral    CHOLECYSTECTOMY     CYSTOSCOPY W/ URETERAL STENT PLACEMENT Left 05/06/2005   DIALYSIS/PERMA CATHETER INSERTION      DIALYSIS/PERMA CATHETER INSERTION Right 08/29/2023   Procedure: DIALYSIS/PERMA CATHETER INSERTION;  Surgeon: Tobie Gordy POUR, MD;  Location: [facility] INVASIVE CV LAB;  Service: Cardiovascular;  Laterality: Right;   EYE SURGERY     FOOT SURGERY Right    Big toe amputation   IVC FILTER INSERTION  08/2016   KIDNEY STONE SURGERY     PERIPHERAL VASCULAR BALLOON ANGIOPLASTY  10/21/2022   Procedure: PERIPHERAL VASCULAR BALLOON ANGIOPLASTY;  Surgeon: Sheree Penne Bruckner, MD;  Location: [facility] INVASIVE CV LAB;  Service: Cardiovascular;;  R AV fistula   PERIPHERAL VASCULAR BALLOON ANGIOPLASTY Right 02/17/2023   Procedure: PERIPHERAL VASCULAR BALLOON ANGIOPLASTY;  Surgeon: Eliza Bruckner RAMAN, MD;  Location: [facility] INVASIVE CV LAB;  Service: Cardiovascular;  Laterality: Right;  rt arrm fistula w/DCB   PERIPHERAL VASCULAR BALLOON ANGIOPLASTY  07/08/2023   Procedure: PERIPHERAL VASCULAR BALLOON ANGIOPLASTY;  Surgeon: Norine Manuelita LABOR, MD;  Location: [facility] INVASIVE CV LAB;  Service: Cardiovascular;;  Cephalic Vein   PERIPHERAL VASCULAR THROMBECTOMY Right 08/28/2023   Procedure: PERIPHERAL VASCULAR THROMBECTOMY;  Surgeon: Melia Lynwood ORN, MD;  Location: [facility] INVASIVE CV LAB;  Service: Cardiovascular;  Laterality: Right;   REVISON OF ARTERIOVENOUS FISTULA Right 08/02/2022   Procedure: REVISION OF RIGHT ARM FISTULA WITH TRANSPOSITION AND BRANCH LIGATION;  Surgeon: Sheree Penne Bruckner, MD;  Location: [facility] OR;  Service: Vascular;  Laterality: Right;  Regional Block   SHOULDER SURGERY Left    TEE WITHOUT CARDIOVERSION N/A 11/03/2020   Procedure: TRANSESOPHAGEAL ECHOCARDIOGRAM (TEE);  Surgeon: Barbaraann Darryle Ned, MD;  Location: [facility] ENDOSCOPY;  Service: Cardiovascular;  Laterality: N/A;   VENOUS ANGIOPLASTY Right 08/28/2023   Procedure: VENOUS ANGIOPLASTY;  Surgeon: Melia Lynwood ORN, MD;  Location: [facility] INVASIVE CV LAB;  Service: Cardiovascular;  Laterality: Right;  70% Body of AVF x2    Family History  Problem Relation Age of Onset    Cancer Mother    Clotting disorder Mother    Stroke Mother    Alzheimer's disease Mother    Diabetes Mother    Diabetes Father     Social History   Socioeconomic History   Marital status: Married    Spouse name: Not on file   Number of children: Not on file   Years of education: Not on file   Highest education level: Not on file  Occupational History   Not on file  Tobacco Use   Smoking status: Former    Current packs/day: 0.00    Types: Cigarettes    Start date: 50    Quit date: 1986    Years since quitting: 39.9   Smokeless tobacco: Never  Vaping Use   Vaping status: Never Used  Substance and Sexual Activity   Alcohol use: Not Currently   Drug use: Not Currently   Sexual activity: Not on file  Other Topics Concern   Not on file  Social History Narrative   Not on file   Social Drivers of Health   Tobacco Use: Medium Risk (02/17/2024)   Patient History    Smoking Tobacco Use: Former    Smokeless Tobacco Use: Never    Passive Exposure: Not on file  Financial Resource Strain: Low Risk (01/19/2022)   Received from [facility]   Overall Financial Resource Strain (CARDIA)    Difficulty of Paying Living Expenses: Not hard at all  Food Insecurity: Low Risk (12/16/2023)   Received from [facility]   Epic    Within the past 12 months, you worried that your food would run out before you got money to buy more: Never true    Within the past 12 months, the food you bought just didn't last and you didn't have money to get more. : Never true  Transportation Needs: No Transportation Needs (12/16/2023)   Received from [facility]    In the past 12 months, has lack of reliable transportation kept you from medical appointments, meetings, work or from getting things needed for daily living? : No  Physical Activity: Inactive (01/19/2022)   Received from [facility]   Exercise Vital Sign    On average, how many days per week do you engage in moderate to  strenuous exercise (like a brisk walk)?: 0 days    On average, how many minutes do you engage in exercise at this level?: 0 min  Stress: Stress Concern Present (01/19/2022)   Received from [facility] of Occupational Health - Occupational Stress Questionnaire    Feeling of Stress : To some extent  Social Connections: Socially Integrated (01/19/2022)   Received from [facility]   Social Connection and Isolation Panel    In a typical week, how many times do you talk on the phone with family, friends, or neighbors?: More than three times a week  How often do you get together with friends or relatives?: More than three times a week    How often do you attend church or religious services?: More than 4 times per year    Do you belong to any clubs or organizations such as church groups, unions, fraternal or athletic groups, or school groups?: Yes    How often do you attend meetings of the clubs or organizations you belong to?: More than 4 times per year    Are you married, widowed, divorced, separated, never married, or living with a partner?: Married  Intimate Partner Violence: Not At Risk (01/19/2022)   Received from [facility]   Epic    Within the last year, have you been afraid of your partner or ex-partner?: No    Within the last year, have you been humiliated or emotionally abused in other ways by your partner or ex-partner?: No    Within the last year, have you been kicked, hit, slapped, or otherwise physically hurt by your partner or ex-partner?: No    Within the last year, have you been raped or forced to have any kind of sexual activity by your partner or ex-partner?: No  Depression (PHQ2-9): Not on file  Alcohol Screen: Not on file  Housing: Low Risk (12/16/2023)   Received from [facility]   Epic    What is your living situation today?: I have a steady place to live    Think about the place you live. Do you have problems with any of the following?  Choose all that apply:: None/None on this list  Utilities: Low Risk (12/16/2023)   Received from [facility]   Utilities    In the past 12 months has the electric, gas, oil, or water company threatened to shut off services in your home? : No  Health Literacy: Low Risk (01/19/2022)   Received from [facility] Literacy    How often do you need to have someone help you when you read instructions, pamphlets, or other written material from your doctor or pharmacy?: Never    Allergies[1]  No current facility-administered medications for this encounter.    PHYSICAL EXAM Vitals:   05/17/24 0716  BP: (!) 140/67  Pulse: 82  Resp: 18  SpO2: 97%   No acute distress Regular rate and rhythm Unlabored breathing Right arm AV fistula without thrill  PERTINENT LABORATORY AND RADIOLOGIC DATA  Most recent CBC    Latest Ref Rng & Units 01/09/2024    8:17 AM 11/11/2023    6:08 AM 06/19/2023    1:23 PM  CBC  WBC 4.0 - 10.5 K/uL   11.3   Hemoglobin 13.0 - 17.0 g/dL 88.3  85.6  88.2   Hematocrit 39.0 - 52.0 % 34.0  42.0  34.5   Platelets 150 - 400 K/uL   271      Most recent CMP    Latest Ref Rng & Units 01/09/2024    8:17 AM 11/11/2023    6:08 AM 06/19/2023    1:23 PM  CMP  Glucose 70 - 99 mg/dL 760  760  461   BUN 6 - 20 mg/dL 49  46  55   Creatinine 0.61 - 1.24 mg/dL 4.89  5.09  5.50   Sodium 135 - 145 mmol/L 134  133  137   Potassium 3.5 - 5.1 mmol/L 4.0  3.8  3.7   Chloride 98 - 111 mmol/L 95  95  91  CO2 22 - 32 mmol/L   28   Calcium 8.9 - 10.3 mg/dL   9.9   Total Protein 6.5 - 8.1 g/dL   7.8   Total Bilirubin 0.0 - 1.2 mg/dL   0.3   Alkaline Phos 38 - 126 U/L   144   AST 15 - 41 U/L   9   ALT 0 - 44 U/L   <5     Renal function CrCl cannot be calculated (Patient's most recent lab result is older than the maximum 21 days allowed.).  Hgb A1c MFr Bld (%)  Date Value  11/01/2020 10.5 (H)    Debby SAILOR. Magda, MD FACS [facility] of  [facility] Phone Number: [phone] 05/17/2024 8:28 AM   Total time spent on preparing this encounter including chart review, data review, collecting history, examining the patient, and coordinating care: 30 minutes  Portions of this report may have been transcribed using voice recognition software.  Every effort has been made to ensure accuracy; however, inadvertent computerized transcription errors may still be present.       [1] No Known Allergies

## 2024-05-17 NOTE — Op Note (Signed)
 DATE OF SERVICE: 05/17/2024  PATIENT:  Sean Valentine  58 y.o. male  PRE-OPERATIVE DIAGNOSIS:  end-stage renal disease; thrombosed right arm AVF  POST-OPERATIVE DIAGNOSIS:  Same  PROCEDURE:   1) Ultrasound guided right internal jugular access  2) placement of tunneled dialysis catheter (CPT [phone]) 3) conscious sedation (20 minutes) (CPT 99152) 4) established outpatient evaluation and management - level 3 (CPT 99213)  SURGEON:  Debby SAILOR. Magda, MD  ASSISTANT: none  ANESTHESIA:   local and IV sedation  ESTIMATED BLOOD LOSS: min  LOCAL MEDICATIONS USED:  LIDOCAINE    COUNTS: confirmed correct.  PATIENT DISPOSITION:  PACU - hemodynamically stable.   Delay start of Pharmacological VTE agent (>24hrs) due to surgical blood loss or risk of bleeding: no  INDICATION FOR PROCEDURE: Coltrane Tugwell is a 58 y.o. male with ESRD on HD. His right arm AVF has thrombosed. After careful discussion of risks, benefits, and alternatives the patient was offered [facility] placement. The patient understood and wished to proceed.  OPERATIVE FINDINGS:  Successful placement of RIJ 23cm TDC  DESCRIPTION OF PROCEDURE: After identification of the patient in the pre-operative holding area, the patient was transferred to the operating room. The patient was positioned supine on the operating room table.  The right neck and chest were prepped and draped in standard fashion. A surgical pause was performed confirming correct patient, procedure, and operative location.  Using ultrasound guidance the right internal jugular vein was accessed with micropuncture technique.  Through the micropuncture sheath a floppy J-wire was advanced into the superior vena cava.  A small incision was made around the skin access point.  The access point was serially dilated under direct fluoroscopic guidance.  A peel-away sheath was introduced into the superior vena cava under fluoroscopic guidance.  A counterincision was made in the chest under the  clavicle.  A 23 cm tunnel dialysis catheter was then tunneled under the skin, over the clavicle into the incision in the neck.  The tunneling device was removed and the catheter fed through the peel-away sheath into the superior vena cava.  The peel-away sheath was removed and the catheter gently pulled back.  Adequate position was confirmed with x-ray.  The catheter was tested and found to flush and draw back well.  Catheter was heparin  locked.  Caps were applied.  Catheter was sutured to the skin.  The neck incision was closed with 4-0 Monocryl.   Upon completion of the case instrument and sharps counts were confirmed correct. The patient was transferred to the PACU in good condition. I was present for all portions of the procedure.  PLAN: OK to use catheter. Follow up with [facility] with vein mapping to discuss new access.  Debby SAILOR. Magda, MD [facility] of [facility] Phone Number: [phone] 05/17/2024 9:18 AM

## 2024-05-18 ENCOUNTER — Encounter (HOSPITAL_COMMUNITY): Payer: Self-pay | Admitting: Vascular Surgery

## 2024-06-17 ENCOUNTER — Inpatient Hospital Stay: Payer: BC Managed Care – PPO | Attending: Oncology

## 2024-06-17 DIAGNOSIS — E1122 Type 2 diabetes mellitus with diabetic chronic kidney disease: Secondary | ICD-10-CM | POA: Insufficient documentation

## 2024-06-17 DIAGNOSIS — N186 End stage renal disease: Secondary | ICD-10-CM | POA: Diagnosis not present

## 2024-06-17 LAB — CBC WITH DIFFERENTIAL (CANCER CENTER ONLY)
Abs Immature Granulocytes: 0.03 K/uL (ref 0.00–0.07)
Basophils Absolute: 0.1 K/uL (ref 0.0–0.1)
Basophils Relative: 1 %
Eosinophils Absolute: 0.2 K/uL (ref 0.0–0.5)
Eosinophils Relative: 3 %
HCT: 30.2 % — ABNORMAL LOW (ref 39.0–52.0)
Hemoglobin: 10.3 g/dL — ABNORMAL LOW (ref 13.0–17.0)
Immature Granulocytes: 0 %
Lymphocytes Relative: 20 %
Lymphs Abs: 1.5 K/uL (ref 0.7–4.0)
MCH: 34.4 pg — ABNORMAL HIGH (ref 26.0–34.0)
MCHC: 34.1 g/dL (ref 30.0–36.0)
MCV: 101 fL — ABNORMAL HIGH (ref 80.0–100.0)
Monocytes Absolute: 0.6 K/uL (ref 0.1–1.0)
Monocytes Relative: 8 %
Neutro Abs: 5 K/uL (ref 1.7–7.7)
Neutrophils Relative %: 68 %
Platelet Count: 220 K/uL (ref 150–400)
RBC: 2.99 MIL/uL — ABNORMAL LOW (ref 4.22–5.81)
RDW: 15.6 % — ABNORMAL HIGH (ref 11.5–15.5)
WBC Count: 7.5 K/uL (ref 4.0–10.5)
nRBC: 0 % (ref 0.0–0.2)

## 2024-06-17 LAB — CMP (CANCER CENTER ONLY)
ALT: 9 U/L (ref 0–44)
AST: 15 U/L (ref 15–41)
Albumin: 3.7 g/dL (ref 3.5–5.0)
Alkaline Phosphatase: 137 U/L — ABNORMAL HIGH (ref 38–126)
Anion gap: 15 (ref 5–15)
BUN: 51 mg/dL — ABNORMAL HIGH (ref 6–20)
CO2: 27 mmol/L (ref 22–32)
Calcium: 10.1 mg/dL (ref 8.9–10.3)
Chloride: 94 mmol/L — ABNORMAL LOW (ref 98–111)
Creatinine: 4.6 mg/dL — ABNORMAL HIGH (ref 0.61–1.24)
GFR, Estimated: 14 mL/min — ABNORMAL LOW
Glucose, Bld: 407 mg/dL — ABNORMAL HIGH (ref 70–99)
Potassium: 3.9 mmol/L (ref 3.5–5.1)
Sodium: 135 mmol/L (ref 135–145)
Total Bilirubin: 0.3 mg/dL (ref 0.0–1.2)
Total Protein: 7.4 g/dL (ref 6.5–8.1)

## 2024-06-17 LAB — IRON AND TIBC
Iron: 79 ug/dL (ref 45–182)
Saturation Ratios: 31 % (ref 17.9–39.5)
TIBC: 258 ug/dL (ref 250–450)
UIBC: 178 ug/dL

## 2024-06-17 LAB — FERRITIN: Ferritin: 1227 ng/mL — ABNORMAL HIGH (ref 24–336)

## 2024-06-17 NOTE — Progress Notes (Signed)
 " Sean Valentine at [facility] [address] [place],  [place]  [address] [phone]  Clinic Day:  06/18/2024  Referring physician: [name] LABOR., MD   HISTORY OF PRESENT ILLNESS:  The patient is a 59 y.o. 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  Due to his elevated iron parameters, the patient did undergo periodic phlebotomies in the past.  He comes in today to reassess his iron parameters.  Since his last visit, the patient has been doing okay.  The major health issue impacting his daily quality of life is his end-stage renal disease, for which he undergoes hemodialysis 3 times per week.  He also has very brittle diabetes where his blood sugar could be as high as 400-500 despite being compliant with all of his diabetic medications.  He is also currently dealing with a left big toe infection.  He denies having any systemic symptoms or complications related to his underlying hemochromatosis.  PHYSICAL EXAM:  Blood pressure 131/73, pulse 73, temperature 98.4 F (36.9 C), temperature source Oral, resp. rate 18, height 6' 5 (1.956 m), weight (!) 336 lb 13.8 oz (152.8 kg), SpO2 95%. Wt Readings from Last 3 Encounters:  06/18/24 (!) 336 lb 13.8 oz (152.8 kg)  02/17/24 (!) 341 lb 4.8 oz (154.8 kg)  01/28/24 (!) 340 lb 6.4 oz (154.4 kg)   Body mass index is 39.95 kg/m. Performance status (ECOG): 1 - Symptomatic but completely ambulatory Physical Exam Constitutional:      Appearance: Normal appearance. He is obese. He is not ill-appearing.     Comments: Chronically ill appearance.  He is in a wheelchair  HENT:     Mouth/Throat:     Mouth: Mucous membranes are moist.     Pharynx: Oropharynx is clear. No oropharyngeal exudate or posterior oropharyngeal erythema.  Cardiovascular:     Rate and Rhythm: Normal rate and regular rhythm.     Heart sounds: No murmur heard.    No friction rub. No gallop.  Pulmonary:     Effort: Pulmonary effort is normal. No respiratory distress.      Breath sounds: Normal breath sounds. No wheezing, rhonchi or rales.  Abdominal:     General: Bowel sounds are normal. There is no distension.     Palpations: Abdomen is soft. There is no mass.     Tenderness: There is no abdominal tenderness.  Musculoskeletal:        General: No swelling.     Right lower leg: No edema.     Left lower leg: No edema.  Lymphadenopathy:     Cervical: No cervical adenopathy.     Upper Body:     Right upper body: No supraclavicular or axillary adenopathy.     Left upper body: No supraclavicular or axillary adenopathy.     Lower Body: No right inguinal adenopathy. No left inguinal adenopathy.  Skin:    General: Skin is warm.     Coloration: Skin is not jaundiced.     Findings: No lesion or rash.  Neurological:     General: No focal deficit present.     Mental Status: He is alert and oriented to person, place, and time. Mental status is at baseline.  Psychiatric:        Mood and Affect: Mood normal.        Behavior: Behavior normal.        Thought Content: Thought content normal.    LABS:      Latest Ref Rng & Units 06/17/2024  11:00 AM 01/09/2024    8:17 AM 11/11/2023    6:08 AM  CBC  WBC 4.0 - 10.5 K/uL 7.5     Hemoglobin 13.0 - 17.0 g/dL 89.6  88.3  85.6   Hematocrit 39.0 - 52.0 % 30.2  34.0  42.0   Platelets 150 - 400 K/uL 220         Latest Ref Rng & Units 06/17/2024   11:00 AM 01/09/2024    8:17 AM 11/11/2023    6:08 AM  CMP  Glucose 70 - 99 mg/dL 592  760  760   BUN 6 - 20 mg/dL 51  49  46   Creatinine 0.61 - 1.24 mg/dL 5.39  4.89  5.09   Sodium 135 - 145 mmol/L 135  134  133   Potassium 3.5 - 5.1 mmol/L 3.9  4.0  3.8   Chloride 98 - 111 mmol/L 94  95  95   CO2 22 - 32 mmol/L 27     Calcium 8.9 - 10.3 mg/dL 89.8     Total Protein 6.5 - 8.1 g/dL 7.4     Total Bilirubin 0.0 - 1.2 mg/dL 0.3     Alkaline Phos 38 - 126 U/L 137     AST 15 - 41 U/L 15     ALT 0 - 44 U/L 9       Latest Reference Range & Units 06/17/24 11:00  Iron 45  - 182 ug/dL 79  UIBC ug/dL 821  TIBC 749 - 549 ug/dL 741  Saturation Ratios 17.9 - 39.5 % 31  Ferritin 24 - 336 ng/mL 1,227 (H)  (H): Data is abnormally high  ASSESSMENT & PLAN:  Assessment/Plan:  A 59 y.o. 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  When evaluating his iron parameters today, I do believe his very elevated ferritin is an acute phase reactant due to his multiple medical issues.  When looking at his other iron parameters, his serum iron is not particularly high, which usually would be seen in someone with hemochromatosis when a phlebotomy is necessary.  Furthermore, the patient understands he does not have the most aggressive form of hemochromatosis [C282Y/C282Y] to where routine phlebotomies would be necessary.  For now, his iron parameters will continue to be followed conservatively.  He understands his other medical issues, including his hypertension, diabetes, and renal failure, are of paramount importance with respect to continued, close management.  I will see him back in 1 year for repeat clinical assessment.  The patient understands all the plans discussed today and is in agreement with them.    [name] [name], MD       "

## 2024-06-18 ENCOUNTER — Telehealth: Payer: Self-pay | Admitting: Oncology

## 2024-06-18 ENCOUNTER — Other Ambulatory Visit: Payer: Self-pay | Admitting: Oncology

## 2024-06-18 ENCOUNTER — Inpatient Hospital Stay (HOSPITAL_BASED_OUTPATIENT_CLINIC_OR_DEPARTMENT_OTHER): Payer: BC Managed Care – PPO | Admitting: Oncology

## 2024-06-18 NOTE — Telephone Encounter (Signed)
 Patient has been scheduled for follow-up visit per 06/18/2024 LOS.  Pt given an appt calendar with date and time.

## 2024-06-20 ENCOUNTER — Encounter: Payer: Self-pay | Admitting: Oncology

## 2024-07-05 ENCOUNTER — Other Ambulatory Visit: Payer: Self-pay

## 2024-07-05 DIAGNOSIS — N186 End stage renal disease: Secondary | ICD-10-CM

## 2024-07-06 ENCOUNTER — Encounter: Payer: Self-pay | Admitting: Oncology

## 2024-07-28 ENCOUNTER — Ambulatory Visit: Admitting: Vascular Surgery

## 2024-07-28 ENCOUNTER — Ambulatory Visit (HOSPITAL_COMMUNITY)

## 2024-08-19 ENCOUNTER — Ambulatory Visit: Admitting: Cardiology

## 2025-06-14 ENCOUNTER — Inpatient Hospital Stay

## 2025-06-16 ENCOUNTER — Inpatient Hospital Stay: Admitting: Oncology
